# Patient Record
Sex: Male | Born: 1945 | Race: White | Hispanic: No | Marital: Married | Smoking: Former smoker
Health system: Southern US, Community
[De-identification: ages and names within clinical notes are randomized; demographics above are authoritative.]

## PROBLEM LIST (undated history)

## (undated) DIAGNOSIS — C4490 Unspecified malignant neoplasm of skin, unspecified: Secondary | ICD-10-CM

## (undated) DIAGNOSIS — I219 Acute myocardial infarction, unspecified: Secondary | ICD-10-CM

## (undated) DIAGNOSIS — Z9289 Personal history of other medical treatment: Secondary | ICD-10-CM

## (undated) DIAGNOSIS — J189 Pneumonia, unspecified organism: Secondary | ICD-10-CM

## (undated) DIAGNOSIS — G4733 Obstructive sleep apnea (adult) (pediatric): Secondary | ICD-10-CM

## (undated) DIAGNOSIS — Z87442 Personal history of urinary calculi: Secondary | ICD-10-CM

## (undated) DIAGNOSIS — I259 Chronic ischemic heart disease, unspecified: Secondary | ICD-10-CM

## (undated) DIAGNOSIS — I10 Essential (primary) hypertension: Secondary | ICD-10-CM

## (undated) DIAGNOSIS — M1990 Unspecified osteoarthritis, unspecified site: Secondary | ICD-10-CM

## (undated) DIAGNOSIS — N200 Calculus of kidney: Secondary | ICD-10-CM

## (undated) DIAGNOSIS — Z9989 Dependence on other enabling machines and devices: Secondary | ICD-10-CM

## (undated) DIAGNOSIS — Z8739 Personal history of other diseases of the musculoskeletal system and connective tissue: Secondary | ICD-10-CM

## (undated) DIAGNOSIS — I2510 Atherosclerotic heart disease of native coronary artery without angina pectoris: Secondary | ICD-10-CM

## (undated) DIAGNOSIS — R600 Localized edema: Secondary | ICD-10-CM

## (undated) DIAGNOSIS — D649 Anemia, unspecified: Secondary | ICD-10-CM

## (undated) DIAGNOSIS — Z8719 Personal history of other diseases of the digestive system: Secondary | ICD-10-CM

## (undated) DIAGNOSIS — K219 Gastro-esophageal reflux disease without esophagitis: Secondary | ICD-10-CM

## (undated) DIAGNOSIS — E785 Hyperlipidemia, unspecified: Secondary | ICD-10-CM

## (undated) HISTORY — PX: SKIN CANCER EXCISION: SHX779

## (undated) HISTORY — DX: Acute myocardial infarction, unspecified: I21.9

## (undated) HISTORY — DX: Localized edema: R60.0

## (undated) HISTORY — DX: Essential (primary) hypertension: I10

## (undated) HISTORY — PX: HERNIA REPAIR: SHX51

## (undated) HISTORY — DX: Hyperlipidemia, unspecified: E78.5

---
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
## 1976-04-21 HISTORY — PX: EYE SURGERY: SHX253

## 1978-04-21 DIAGNOSIS — Z8719 Personal history of other diseases of the digestive system: Secondary | ICD-10-CM

## 1978-04-21 HISTORY — DX: Personal history of other diseases of the digestive system: Z87.19

## 1995-04-22 HISTORY — PX: CATARACT EXTRACTION W/ INTRAOCULAR LENS IMPLANT: SHX1309

## 1998-03-29 ENCOUNTER — Ambulatory Visit (HOSPITAL_COMMUNITY): Admission: RE | Admit: 1998-03-29 | Discharge: 1998-03-29 | Payer: Self-pay | Admitting: Gastroenterology

## 1999-07-30 ENCOUNTER — Ambulatory Visit (HOSPITAL_COMMUNITY): Admission: RE | Admit: 1999-07-30 | Discharge: 1999-07-30 | Payer: Self-pay | Admitting: Gastroenterology

## 1999-07-30 ENCOUNTER — Encounter: Payer: Self-pay | Admitting: Gastroenterology

## 2001-04-07 ENCOUNTER — Ambulatory Visit (HOSPITAL_COMMUNITY): Admission: RE | Admit: 2001-04-07 | Discharge: 2001-04-07 | Payer: Self-pay | Admitting: Gastroenterology

## 2003-03-30 ENCOUNTER — Ambulatory Visit (HOSPITAL_COMMUNITY): Admission: RE | Admit: 2003-03-30 | Discharge: 2003-03-30 | Payer: Self-pay | Admitting: Gastroenterology

## 2004-04-04 ENCOUNTER — Encounter (INDEPENDENT_AMBULATORY_CARE_PROVIDER_SITE_OTHER): Payer: Self-pay | Admitting: *Deleted

## 2004-04-04 ENCOUNTER — Ambulatory Visit (HOSPITAL_COMMUNITY): Admission: RE | Admit: 2004-04-04 | Discharge: 2004-04-04 | Payer: Self-pay | Admitting: Gastroenterology

## 2005-04-21 HISTORY — PX: CORONARY ANGIOPLASTY WITH STENT PLACEMENT: SHX49

## 2005-10-29 ENCOUNTER — Inpatient Hospital Stay (HOSPITAL_COMMUNITY): Admission: EM | Admit: 2005-10-29 | Discharge: 2005-11-02 | Payer: Self-pay | Admitting: Emergency Medicine

## 2005-10-29 DIAGNOSIS — I219 Acute myocardial infarction, unspecified: Secondary | ICD-10-CM

## 2005-10-29 HISTORY — DX: Acute myocardial infarction, unspecified: I21.9

## 2005-10-31 ENCOUNTER — Encounter: Payer: Self-pay | Admitting: Cardiology

## 2005-11-13 ENCOUNTER — Encounter (HOSPITAL_COMMUNITY): Admission: RE | Admit: 2005-11-13 | Discharge: 2006-02-11 | Payer: Self-pay | Admitting: Cardiology

## 2006-02-12 ENCOUNTER — Encounter (HOSPITAL_COMMUNITY): Admission: RE | Admit: 2006-02-12 | Discharge: 2006-05-13 | Payer: Self-pay | Admitting: Cardiology

## 2007-01-06 HISTORY — PX: DOPPLER ECHOCARDIOGRAPHY: SHX263

## 2009-05-28 HISTORY — PX: CARDIOVASCULAR STRESS TEST: SHX262

## 2010-02-06 ENCOUNTER — Ambulatory Visit: Payer: Self-pay | Admitting: Cardiology

## 2010-05-13 ENCOUNTER — Ambulatory Visit: Payer: Self-pay | Admitting: Cardiology

## 2010-06-05 ENCOUNTER — Other Ambulatory Visit: Payer: Self-pay | Admitting: Chiropractic Medicine

## 2010-06-05 DIAGNOSIS — M25511 Pain in right shoulder: Secondary | ICD-10-CM

## 2010-06-10 ENCOUNTER — Ambulatory Visit
Admission: RE | Admit: 2010-06-10 | Discharge: 2010-06-10 | Disposition: A | Discharge status: Home / Self Care | Payer: Self-pay | Source: Ambulatory Visit | Attending: Chiropractic Medicine | Admitting: Chiropractic Medicine

## 2010-06-10 DIAGNOSIS — M25511 Pain in right shoulder: Secondary | ICD-10-CM

## 2010-06-20 HISTORY — PX: SHOULDER ARTHROSCOPY W/ ROTATOR CUFF REPAIR: SHX2400

## 2010-09-06 NOTE — Op Note (Signed)
NAME:  Douglas [name], Douglas [name]                         ACCOUNT NO.:  [id number]   MEDICAL RECORD NO.:  [id number]                   PATIENT TYPE:  AMB   LOCATION:  ENDO                                 FACILITY:  [facility]   PHYSICIAN:  James L. Malon Kindle., M.D.          DATE OF BIRTH:  14-Jun-1945   DATE OF PROCEDURE:  03/30/2003  DATE OF DISCHARGE:                                 OPERATIVE REPORT   PROCEDURE:  Esophagogastroduodenoscopy with Savary dilatation.   MEDICATIONS GIVEN:  Fentanyl 35 mcg, Versed 9 mg IV.   INDICATIONS FOR PROCEDURE:  Patient with a long history of esophageal  stricture, was dilated the last time in 2001 to 48 [place], comes in with  increasing dysphagia, had a meat impaction over Thanksgiving that he had to  regurgitate up.   DESCRIPTION OF PROCEDURE:  The procedure had been explained to the patient  including the potential risks and benefits of perforation and bleeding and  consent obtained.  The mobile fluoroscopy unit was used in the endosuite.  Initially, an endoscopy was performed.  The scope was passed and the distal  esophagus was reached.  There was a stricture just above a large hiatal  hernia estimated to be 4-5 cm in length.  The scope easily passed the  stricture and this stricture did appear fairly patent with no blockage at  all to passage of the scope.  The scope was passed into the stomach.  The  gastric outlet was identified and passed.  The duodenum including the bulb  and second portion were seen well.  The scope was withdrawn back into the  stomach.  The gastric antrum and body of the stomach were seen well and were  normal.  The fundus and cardia were seen on the retroflex view and was  normal.  Then, using fluoroscopic guidance, the Savary guide-wire was placed  through the scope along the greater curve of the stomach and the scope  withdrawn over the guide-wire.  Then, with the patient's neck extended, we  passed the 39, 42, 45, and 48 [place]  dilators.  There was a small amount of  heme in the mouth with the 39 [place] dilator even before it was passed, but  this was not present on any of the other dilations.  We had dilated to 45  J[place] on all previous occasions and I felt that this would be adequate.  We  did not dilate further.  There was no blood seen on the 48 [place] dilator.  After passage of the 48 French dilator, the dilator and guide-wire were  withdrawn as a single unit.  The patient tolerated the procedure well.  There were no immediate complications.   PLAN:  Routine post dilatation orders with plans to keep him on clear  liquids for 4-6 hours, slowly advance his diet, and call the report in one  week, we will dilate him again as needed.  He  will continue on his proton  pump inhibitor.                                               James L. Malon Kindle., M.D.    Waldron Session  D:  03/30/2003  T:  03/30/2003  Job:  [id number]   cc:   Windle Guard, M.D.  [address]  [place], [place] [address]  Fax: [phone]

## 2010-09-06 NOTE — Discharge Summary (Signed)
NAME:  Douglas [name], Douglas [name] NO.:  [id number]   MEDICAL RECORD NO.:  [id number]          PATIENT TYPE:  INP   LOCATION:  3715                         FACILITY:  [facility]   PHYSICIAN:  Cassell Clement, M.D. DATE OF BIRTH:  10-05-1945   DATE OF ADMISSION:  10/29/2005  DATE OF DISCHARGE:  11/02/2005                                 DISCHARGE SUMMARY   FINAL DIAGNOSES:  1. Coronary atherosclerosis with acute anterior wall myocardial      infarction.  2. Hypertensive cardiovascular disease.  3. Hypercholesterolemia.   OPERATIONS PERFORMED:  Emergency cardiac catheterization with PTCA of  occluded left anterior descending artery and insertion of drug-eluting  coronary artery stent by Dr. Kristeen Miss on 02/20/2006.   This 65 year old Caucasian gentleman was admitted on the evening of  10/29/2005 with interscapular pain and chest pressure.  He has had no nausea  but has had diaphoresis, pain began approximately 6:30 p.m. on the night of  admission while he was feeding his horses.  He has had no prior history of  known coronary atherosclerosis but had had a history of high blood pressure  for about 10 years and had been on atenolol and hydrochlorothiazide and  niacin.  His family history is positive for coronary atherosclerosis.  The  social history reveals that he is a nonsmoker.  Drinks occasional alcohol  works as an Location manager.   PAST SURGICAL HISTORY:  Includes eye surgery.   REVIEW OF SYSTEMS:  Reveals that he had a recent problem with fluctuating  hypertension and had seen Dr. Jeannetta Nap several weeks ago at which time his  electrocardiogram was normal.  The patient actually was to have seen Dr.  Patty Sermons in the office for an initial evaluation several days after the  date of his hospitalization.   PHYSICAL EXAMINATION:  VITAL SIGNS:  Blood pressure is 125/76, pulse 81.  He  has circumoral pallor.  Jugular venous pressure normal.  Carotids  normal.  CHEST:  Clear to percussion and auscultation.  HEART:  Reveals no murmur, gallop or rub.  ABDOMEN:  Is soft and nontender.  EXTREMITIES:  Show good peripheral pulses.   Electrocardiogram on admission in the emergency room showed hyperacute ST-  segment elevation and V1-V4. chest x-ray showed borderline cardiomegaly but  no acute change.   HOSPITAL COURSE:  The patient was evaluated in the emergency room. It was  felt that the patient was in the midst of an acute anteroseptal myocardial  infarction and a code STEMI was called.  Dr. Elease Hashimoto was consulted and took  the patient to the cath lab number emergently.  In the emergency room the  patient was given aspirin, IV nitroglycerin, IV morphine, IV heparin.  The  catheterization revealed a totally occluded left anterior descending artery  just distal to the first septal diagonal.  The circumflex was a large vessel  with an obtuse marginal 1.  The left main coronary artery was short.  The  right coronary artery was a large vessel with mild to moderate disease  proximal and mid distal.  The left ventriculogram showed an  ejection  fraction of approximately 40% with anterior hypokinesia.  The patient  underwent stenting with a Taxus drug-eluting stent with a good angiographic  results.  Post cath he did have some moderate hematoma at the cath site  requiring prolonged pressure.  The following day 10/30/2005 the patient was  doing well.  He was not complaining of any chest or back pain.  EKG post PCI  showed resolution of a hyperacute ST-segment elevation but did show Q-waves  in one aVL and V2-V5 consistent with a large anterior infarct.  Vital signs  were stable.  He was continued on aspirin, Plavix, Lopressor, Altace and  Lipitor.  Because of the hematoma he was kept on bedrest on 07/12.  By 7/13  he continued to have no further chest discomfort and had only mild right  groin pain.  Peak cardiac enzymes were elevated after  reperfusion showing a  CK of 4000, CK-MB of 350 and a troponin of greater than 100.  Renal function  remained normal.  Hemoglobin A1c was normal.  LDL cholesterol post acute MI  was 101 with an HDL of 31.  He was being treated empirically with Lipitor or  he underwent a two-dimensional echocardiogram on 11/02/2005 to be sure he  was not developing a left ventricular aneurysm or a left ventricular mural  thrombus which would require the addition of Coumadin.  The 2-D echo did not  show any left ventricular aneurysm or mural thrombus and therefore Coumadin  did not have to be added.  By 07/14 the patient was stable enough to be  moved to telemetry and to start cardiac rehab.  By 7/15, he was doing well  enough to be discharged home and he will be followed up in Dr. Yevonne Pax  office on 07/23.   DISCHARGE MEDICINES:  Enteric-coated aspirin 325 mg daily, Plavix 75 mg  daily, Altace 2.5 mg daily, Lipitor 80 mg daily, metoprolol 25 mg daily,  nitroglycerin 1/150 sublingually p.r.n. and Aciphex daily.  He is not to  drive or do any lifting for 1 week and he is to increase activity slowly.  He will be on a low-fat diet.  Condition on discharge is improved.           ______________________________  Cassell Clement, M.D.     TB/MEDQ  D:  12/08/2005  T:  12/08/2005  Job:  [id number]   cc:   Windle Guard, M.D.

## 2010-09-06 NOTE — Procedures (Signed)
[place]  Patient:    Douglas [name], Douglas [name] Visit Number: [id number] MRN: [id number]          Service Type: END Location: ENDO Attending Physician:  Orland Mustard Dictated by:   Llana Aliment. Randa Evens, M.D. Proc. Date: 04/07/01 Admit Date:  04/07/2001   CC:         Hadassah Pais. Jeannetta Nap, M.D.   Procedure Report  PROCEDURE PERFORMED:  Colonoscopy.  ENDOSCOPIST:  Llana Aliment. Randa Evens, M.D.  MEDICATIONS USED:  Fentanyl 75 mcg, Versed 6 mg IV  INSTRUMENT:  Pediatric Olympus video colonoscope.  INDICATIONS:  This is done as a follow-up to previous colon polyps.  DESCRIPTION OF PROCEDURE:  The procedure had been explained to the patient and consent obtained.  With the patient in the left lateral decubitus position, the Olympus pediatric video colonoscope was inserted and advanced under direct visualization.  The prep was quite good and the patient had extensive diverticular disease in the sigmoid colon and we were able to advance over the cecum.  The ileocecal valve and appendiceal orifice were identified. The scope was withdrawn.  The cecum, ascending colon, hepatic flexure, transverse colon, splenic flexure, descending and sigmoid colon were seen well upon withdrawal. No polyps were seen throughout the entire colon.  Extensive diverticular disease was seen in the sigmoid colon.  The scope was withdrawn, patient tolerated the procedure well.  Maintained on low flow oxygen and pulse oximeter throughout the procedure with no obvious problem.  ASSESSMENT: 1. No evidence of further polyps. 2. Extensive diverticular disease.  PLAN:  Recommend repeating in 2 years. Dictated by:   Llana Aliment. Randa Evens, M.D. Attending Physician:  Orland Mustard DD:  04/07/01 TD:  04/07/01 Job: [id number]

## 2010-09-06 NOTE — Cardiovascular Report (Signed)
NAME:  KRAVEN, CALK NO.:  [id number]   MEDICAL RECORD NO.:  [id number]          PATIENT TYPE:  INP   LOCATION:  2915                         FACILITY:  [facility]   PHYSICIAN:  Vesta Mixer, M.D. DATE OF BIRTH:  1945/08/01   DATE OF PROCEDURE:  10/29/2005  DATE OF DISCHARGE:                              CARDIAC CATHETERIZATION   PROCEDURE:  Left heart catheterization with coronary angiography and PTCA  and stenting of the left anterior descending artery.   Mr. Jain is a 65 year old gentleman with a recent onset of some chest  pains.  He presented to the emergency room with some chest pain that started  yesterday beginning around 6 o'clock.  He had a spiral CT which revealed no  evidence of dissection.  He was noted to have hyperacute T-waves on his EKG  and we were called for code STEMI.   The right femoral artery was cannulated using a modified Seldinger  technique.  There was some tortuosity of the vessel and there was a little  bit of difficulty getting up to the central circulation.  The vessel did not  have any atheroma.  All of the catheters were exchanged over a long  guidewire.   HEMODYNAMIC RESULTS:  LV pressure is 116/16 with an aortic pressure of  116/84.   ANGIOGRAPHY:  Left main:  The left main is fairly short and is unremarkable.   Left anterior descending artery gives off a very early large first diagonal  branch.  This vessel is fairly normal.  The LAD gives off a first septal  branch and then is abruptly occluded.  The left circumflex artery gives off  a first obtuse marginal artery which is unremarkable.  There are a few minor  luminal irregularities.   The right coronary artery is very large.  There is mild to moderate  irregularities.  The RCA is dominant.  There is a 60% stenosis just prior to  the origin of the posterior descending artery.  The posterior descending  artery and the posterolateral segment artery have minor luminal  irregularities.   The left ventriculogram was performed in the 30 RAO position.  It reveals  severe hypokinesis/akinesis of the anterior and apical walls.  The ejection  fraction is around 40%.  He does have mild to moderate left ventricular  dysfunction.   PCI:  The left main was engaged using the Judkins left 3.5 side hole guide.  An Clinical cytogeneticist guide wire was placed down the LAD.  Originally, we were  placing the wire blindly since there was no flow down the LAD.  We  originally placed it out the first diagonal vessel.  We positioned a 3.0 x  20 mm Quantum Maverick across the area of occlusion and then withdrew it.  Using this daughtering effect, we were able to get some re-establishment of  flow and then at that point reguided the guide wire down into the main LAD.   The 3.0 x 28 mm Quantum monorail was positioned across this obstruction.  It  was inflated up to 6 atmospheres for 10 seconds, 10  atmospheres for 35  seconds.  We then used a 2.75 x 28 mm Taxus stent.  It was deployed just  distal to the first septal branch at 12 atmospheres for 25 seconds.  This  resulted in a very nice angiographic result.  Poststent dilatation was  achieved using a 3.0 x 20 mm Taxus stent.  It was postdilated up to 12  atmospheres for 30 seconds in the distal aspect and then 12 atmospheres for  15 seconds in the proximal aspect.  There is a very nice angiographic  result.  There was some compromise remaining of the second diagonal vessel.  There was sluggish flow down this vessel.  We made several attempts to  position the wire down this vessel but were not able to cross with a wire.  The lumen actually appears to be getting a little bit better as we took more  pictures.   COMPLICATIONS:  None.   CONCLUSION:  1.  Acute anterior wall myocardial infarction.  2.  Successful percutaneous transluminal coronary angioplasty and stenting      of the mid-left anterior descending artery.  The patient  will be started      on low dose Altace and metoprolol for prevention of left ventricular      dilatation.   The patient does have small to moderate-sized hematoma as leaving the case.  We held pressure for approximately 15 minutes with improvement of the  hematoma.  We will instruct the nurses to pull the sheath as soon as  possible.           ______________________________  Vesta Mixer, M.D.     PJN/MEDQ  D:  10/30/2005  T:  10/30/2005  Job:  [id number]   cc:   Windle Guard, M.D.  Fax: [phone]

## 2010-09-06 NOTE — H&P (Signed)
NAME:  Douglas [name], TIETJE NO.:  [id number]   MEDICAL RECORD NO.:  [id number]          PATIENT TYPE:  INP   LOCATION:  1829                         FACILITY:  [facility]   PHYSICIAN:  Cassell Clement, M.D. DATE OF BIRTH:  1946/03/28   DATE OF ADMISSION:  10/29/2005  DATE OF DISCHARGE:                                HISTORY & PHYSICAL   CHIEF COMPLAINT:  Chest pain.   This is a 65 year old Caucasian gentleman admitted with interscapular pain  and chest pressure.  He has had no nausea.  He has had positive diaphoresis.  The pain began about 6:30 p.m. tonight while feeding his horses.  He has had  no prior cardiac history but has had hypertension for about 10 years.  He  has been on atenolol and hydrochlorothiazide and niacin 7000 mg twice a day.   FAMILY HISTORY:  Positive for coronary artery disease in both parents.   SOCIAL HISTORY:  Reveals that he is married and he has two children.  He is  a nonsmoker.  He drinks occasional alcohol.  He works as an Air traffic controller.   PAST SURGICAL HISTORY:  Eye surgery.   REVIEW OF SYSTEMS:  Reveals that he has had a recent problem with  fluctuating hypertension and saw Dr. Jeannetta Nap several weeks ago which on EKG  was normal and the patient actually has an appointment to see Dr. Patty Sermons  in the next several days.   REVIEW OF SYSTEMS:  GI:  Negative.  GU:  Negative except for history of  kidney stones.  RESPIRATORY:  Negative.   PHYSICAL EXAMINATION:  Blood pressure 125/76, pulse 81 and regular,  respirations are normal.  This is a slightly pale with __________.  Jugular  venous pressure normal.  Carotids normal.  Chest is clear to percussion and  auscultation.  The heart reveals no murmur, rub, or gallop.  The abdomen is  soft and nontender.  Extremities show good peripheral pulses.   His electrocardiogram shows normal sinus rhythm with hyperacute ST segment  elevation in V1 through V4.  Chest x-ray shows  borderline cardiomegaly but  no acute change.   IMPRESSION:  1.  Chest pain and electrocardiogram changes consistent with acute      anteroseptal myocardial infarction.  Pain is ongoing despite having      received IV Dilaudid and IV morphine.  The patient's pain is presently      at a level of 3/10.  2.  Hypertensive cardiovascular disease.  3.  History of hypercholesterolemia.   DISPOSITION:  Code STEMI has been called.  I have called in Dr. Elease Hashimoto who  will be taking the patient to the catheter lab.  The patient has been given  aspirin, IV nitroglycerin, IV morphine and IV heparin.   Condition is guarded at this point.           ______________________________  Cassell Clement, M.D.     TB/MEDQ  D:  10/29/2005  T:  10/29/2005  Job:  [id number]   cc:   Windle Guard, M.D.  Fax: [phone]   Vesta Mixer,  M.D.  Fax: [phone]

## 2010-09-06 NOTE — Op Note (Signed)
NAME:  Douglas [name], SEYMORE NO.:  [id number]   MEDICAL RECORD NO.:  [id number]          PATIENT TYPE:  AMB   LOCATION:  ENDO                         FACILITY:  [facility]   PHYSICIAN:  James L. Malon Kindle., M.D.DATE OF BIRTH:  09-26-1945   DATE OF PROCEDURE:  04/04/2004  DATE OF DISCHARGE:                                 OPERATIVE REPORT   PROCEDURE:  Colonoscopy and coagulation of polyp.   MEDICATIONS:  1.  Fentanyl 75 mcg.  2.  Versed 6 mg IV.   SCOPE:  Olympus pediatric adjustable colonoscope.   INDICATION:  Previous history of polyps.  Procedure done as a follow up.   DESCRIPTION OF PROCEDURE:  The procedure had been explained to the patient  and consent obtained.  Left lateral decubitus position, the Olympus  pediatric adjustable scope inserted and advanced in the colon.  The prep was  excellent.  We were able to easily reach the cecum without difficulty.  The  ileocecal valve and appendiceal orifice were seen.  The scope was withdrawn.  The colon was examined on withdrawal.  The cecum, ascending colon,  transverse colon, splenic flexure, descending colon were seen well.  The  sigmoid colon revealed extensive diverticulosis.  No polyps were seen.  The  scope withdrawn to the rectum.  There was a 3 mm sessile polyp that was  cauterized by hot biopsy forceps.  No other polyps were seen in the rectum.  The scope was withdrawn.  The patient tolerated the procedure well.   ASSESSMENT:  1.  A small rectal polyp cauterized.  211.3.  2.  Extensive diverticulosis.  62.10.   PLAN:  1.  Routine postpolypectomy instructions.  2.  We will recommend repeating procedure in 5 years.      JLE/MEDQ  D:  04/04/2004  T:  04/04/2004  Job:  [id number]   cc:   Windle Guard, M.D.  [address]  [place], [place] [address]  Fax: [phone]

## 2010-09-06 NOTE — Procedures (Signed)
[place]  Patient:    Douglas [name], Douglas [name]                      MRN: [id number] Proc. Date: 07/30/99 Adm. Date:  [id number] Attending:  Orland Mustard CC:         Hadassah Pais. Jeannetta Nap, M.D.                           Procedure Report  PROCEDURE:  Esophagogastroduodenoscopy with esophageal dilation.  ENDOSCOPIST:  Llana Aliment. Edwards, M.D.  MEDICATIONS:  Fentanyl 75 mcg and Versed 6 mg IV.  INDICATIONS:  Previous history of esophageal strictures.  Has been dilated and passed to 48-French.  Last time in 1997.  He has not required dilation since then.  DESCRIPTION OF PROCEDURE:  The procedure had been explained to the patient including potential risks and benefits, and consent obtained.  With the patient in the fluoroscopy unit on the endoscopy unit using a portable fluoroscopy, the Olympus video endoscope was inserted blindly into the esophagus and advanced. There was a stricture seen above a 3-4 cm hiatal hernia.  This was easily passed with the scope.  The stomach was entered, the pylorus identified and passed. The duodenum including the bulb and second portion were seen well.  A complete gastroscopy was performed.  Forward and retroflexed view of the stomach was normal. Using fluoroscopic guidance, the scope was then withdrawn over the Savary guidewire.  The patient then had his neck extended and we passed in the usual manner a 39, 42, 45, and 48-French dilator.  There was no blood with any of the  dilators.  He tolerated this well.  Since his last dilation had been the 48-French, we went ahead and stopped at that point.  There were no immediate complications.  ASSESSMENT:  Esophageal stricture dilated to 48-French.  PLAN:  Will keep on liquids for 4-6 hours, call for chest pain, fever, trouble breathing, etc.  He will call back as needed for repeat dilation. DD:  07/30/99 TD:  07/31/99 Job: [id number]

## 2010-09-27 ENCOUNTER — Encounter: Payer: Self-pay | Admitting: Cardiology

## 2010-10-01 ENCOUNTER — Encounter: Payer: Self-pay | Admitting: Cardiology

## 2010-10-01 ENCOUNTER — Ambulatory Visit (INDEPENDENT_AMBULATORY_CARE_PROVIDER_SITE_OTHER): Payer: Medicare Other | Admitting: Cardiology

## 2010-10-01 DIAGNOSIS — D509 Iron deficiency anemia, unspecified: Secondary | ICD-10-CM | POA: Insufficient documentation

## 2010-10-01 DIAGNOSIS — D500 Iron deficiency anemia secondary to blood loss (chronic): Secondary | ICD-10-CM

## 2010-10-01 DIAGNOSIS — E785 Hyperlipidemia, unspecified: Secondary | ICD-10-CM

## 2010-10-01 DIAGNOSIS — I259 Chronic ischemic heart disease, unspecified: Secondary | ICD-10-CM | POA: Insufficient documentation

## 2010-10-01 DIAGNOSIS — I119 Hypertensive heart disease without heart failure: Secondary | ICD-10-CM | POA: Insufficient documentation

## 2010-10-01 DIAGNOSIS — Z79899 Other long term (current) drug therapy: Secondary | ICD-10-CM

## 2010-10-01 NOTE — Assessment & Plan Note (Signed)
The patient is on simvastatin 20 mg daily.  He brought in some recent labs from the [place] which show good control of his LDL cholesterol.  He's not having any side effects from the statin therapy.

## 2010-10-01 NOTE — Assessment & Plan Note (Signed)
The patient has a past history of severe iron deficiency anemia and is followed by Dr. Carman Ching.  The patient denies any recent evidence of GI blood loss.He is on baby aspirin and Plavix daily because of his drug-eluting stent to his LAD.

## 2010-10-01 NOTE — Assessment & Plan Note (Signed)
This pleasant gentleman has a history of a prior acute anteroseptal myocardial infarction on 10/29/05 treated with a Taxus drug-eluting stent to his LAD.  His last nuclear stress test was 05/28/09 and showed an ejection fraction of 57%.  There is a moderate area of infarction in the apical anterior and distal anteroseptal wall and there was regional wall motion abnormality but there was no reversible ischemia.  The patient denies any recurrent chest pain.  His energy level is good.  He underwent successful shoulder surgery by Dr. Cleophas Dunker on 07/04/10.  Because of his sore shoulder surgery he's been less physically active and his weight has gone up 2 pounds

## 2010-10-01 NOTE — Progress Notes (Signed)
Douglas [name] Date of Birth:  10/20/1945 [facility] / [facility] [address]   [address] [place], [place]  [address] [phone]           Fax   [phone]  HPI: This pleasant 65 year old gentleman is seen for a scheduled followup office visit.  He has a past history of ischemic heart disease and had a large anterior wall myocardial infarction 5 years ago on 10/29/05.  He has not been experiencing any exertional chest pain.  He's not having symptoms of congestive heart failure.  His last nuclear stress test was 05/28/09 and showed normal ejection fraction of 57% and he did have a scar of the apical anterior and distal anteroseptal walls but no reversible ischemia.  The patient has a past history of severe GI bleeding of a chronic nature with severe iron deficiency anemia.  He is followed by Dr. Randa Evens.  He presently is tolerating his baby aspirin and his Plavix without evidence of GI bleed.  Current Outpatient Prescriptions  Medication Sig Dispense Refill  . amLODipine (NORVASC) 5 MG tablet Take 5 mg by mouth 2 (two) times daily.        Marland Kitchen aspirin 81 MG EC tablet Take 81 mg by mouth daily.        . carvedilol (COREG) 25 MG tablet Take 25 mg by mouth 2 (two) times daily with a meal.        . Cholecalciferol (VITAMIN D) 2000 UNITS tablet Take 2,000 Units by mouth daily.        . clopidogrel (PLAVIX) 75 MG tablet Take 75 mg by mouth daily.        . colchicine 0.6 MG tablet Take 0.6 mg by mouth as needed.        . ferrous sulfate 325 (65 FE) MG EC tablet Take 325 mg by mouth 2 (two) times a week.        . fexofenadine (ALLEGRA) 180 MG tablet Take 180 mg by mouth as needed.        . nitroGLYCERIN (NITROSTAT) 0.4 MG SL tablet Place 0.4 mg under the tongue every 5 (five) minutes as needed.        . simvastatin (ZOCOR) 20 MG tablet Take 20 mg by mouth at bedtime.        . valACYclovir (VALTREX) 1000 MG tablet Take 1,000 mg by mouth as needed.          No Known  Allergies  Patient Active Problem List  Diagnoses  . Ischemic heart disease  . Benign hypertensive heart disease without heart failure  . Dyslipidemia  . Iron deficiency anemia secondary to blood loss (chronic)    History  Smoking status  . Former Smoker  . Quit date: 04/21/1968  Smokeless tobacco  . Not on file    History  Alcohol Use  . Yes    occasional    Family History  Problem Relation Age of Onset  . Heart disease Mother   . Heart disease Father     Review of Systems: The patient denies any heat or cold intolerance.  No weight gain or weight loss.  The patient denies headaches or blurry vision.  There is no cough or sputum production.  The patient denies dizziness.  There is no hematuria or hematochezia.  The patient denies any muscle aches or arthritis.  The patient denies any rash.  The patient denies frequent falling or instability.  There is no history of depression or anxiety.  All other systems were reviewed and are negative.   Physical Exam: Filed Vitals:   10/01/10 1004  BP: 120/82  Pulse: 60  The general appearance reveals a well-developed well-nourished gentleman in no distress.  The head and neck exam reveals pupils equal and reactive.  Extraocular movements are full.  There is no scleral icterus.  The mouth and pharynx are normal.  The neck is supple.  The carotids reveal no bruits.  The jugular venous pressure is normal.  The  thyroid is not enlarged.  There is no lymphadenopathy.  The chest is clear to percussion and auscultation.  There are no rales or rhonchi.  Expansion of the chest is symmetrical.  The precordium is quiet.  The first heart sound is normal.  The second heart sound is physiologically split.  There is no murmur gallop rub or click.  There is no abnormal lift or heave.  The abdomen is soft and nontender.  The bowel sounds are normal.  The liver and spleen are not enlarged.  There are no abdominal masses.  There are no abdominal bruits.   Extremities reveal good pedal pulses.  There is no phlebitis or edema.  There is no cyanosis or clubbing.  Strength is normal and symmetrical in all extremities.  There is no lateralizing weakness.  There are no sensory deficits.  The skin is warm and dry.  There is no rash.    Assessment / Plan: Continue careful heart healthy weight loss diet.  Recheck in 4 months for a followup office visit fasting lipid panel hepatic function panel and basal metabolic panel.

## 2010-10-08 ENCOUNTER — Other Ambulatory Visit: Payer: Self-pay | Admitting: Dermatology

## 2010-10-14 ENCOUNTER — Telehealth: Payer: Self-pay | Admitting: Cardiology

## 2010-10-14 DIAGNOSIS — R609 Edema, unspecified: Secondary | ICD-10-CM

## 2010-10-14 MED ORDER — FUROSEMIDE 20 MG PO TABS: 20.0000 mg | ORAL_TABLET | Freq: Every day | ORAL | Status: DC | PRN

## 2010-10-14 NOTE — Telephone Encounter (Signed)
Wt up about 9 lbs yesterday and wife stated he looked "puffy".  Wt down some today and feels ok. Swelling in ankles and feet down some.  No other symptoms other than the wt and swelling.  Takes norvasc bid.  Please advise

## 2010-10-14 NOTE — Telephone Encounter (Signed)
Spoke with Dr Patty Sermons and will decrease amlodipine to 5 mg to daily (was bid) and add lasix 20 mg as needed.  Called and advised wife.  Also advised her to have him continue to take blood pressure and let us know if it goes up.  Verbalized understanding.

## 2010-10-14 NOTE — Telephone Encounter (Signed)
Patient's wife is concerned that he may be retaining fluid.  His ankles were very swollen yesterday.

## 2010-12-16 ENCOUNTER — Other Ambulatory Visit: Payer: Self-pay | Admitting: *Deleted

## 2010-12-16 DIAGNOSIS — I519 Heart disease, unspecified: Secondary | ICD-10-CM

## 2010-12-16 MED ORDER — CLOPIDOGREL BISULFATE 75 MG PO TABS: 75.0000 mg | ORAL_TABLET | Freq: Every day | ORAL | Status: DC

## 2010-12-16 NOTE — Telephone Encounter (Signed)
Refilled generic plavix 

## 2011-01-14 ENCOUNTER — Telehealth: Payer: Self-pay | Admitting: Cardiology

## 2011-01-14 ENCOUNTER — Other Ambulatory Visit: Payer: Self-pay | Admitting: *Deleted

## 2011-01-14 DIAGNOSIS — R5383 Other fatigue: Secondary | ICD-10-CM

## 2011-01-14 NOTE — Telephone Encounter (Signed)
One increase amlodipine back to 5 mg twice a day and if swelling recurs he can try taking the Lasix more often.

## 2011-01-14 NOTE — Telephone Encounter (Signed)
Since he has had a past history of anemia he should have a CBC if he's not had one recently

## 2011-01-14 NOTE — Telephone Encounter (Signed)
When spoke with wife she said he was feeling more tired than normal, but usually when blood pressure going up some.  Also it is later in the day.  Denies any other symptoms.  Advised if increasing medication did not help, call back.  Any thing else?

## 2011-01-14 NOTE — Telephone Encounter (Signed)
Advised ok to increase

## 2011-01-14 NOTE — Telephone Encounter (Signed)
Advised and scheduled

## 2011-01-14 NOTE — Telephone Encounter (Signed)
Pt calling to speak w/ Juliette Alcide. Please return call.   Cell: [phone]

## 2011-01-14 NOTE — Telephone Encounter (Signed)
Was taking Amlodipine 5 mg twice daily and was decreased to once daily secondary to swelling.  Had swelling twice where he had to use lasix.  blood pressure 139/87 and has been going up.  Was taking Amlodipine 5 mg twice daily, and doesn't know if he should increase back.

## 2011-01-15 ENCOUNTER — Other Ambulatory Visit (INDEPENDENT_AMBULATORY_CARE_PROVIDER_SITE_OTHER): Payer: Medicare Other | Admitting: *Deleted

## 2011-01-15 DIAGNOSIS — R5383 Other fatigue: Secondary | ICD-10-CM

## 2011-01-15 DIAGNOSIS — R5381 Other malaise: Secondary | ICD-10-CM

## 2011-01-15 LAB — CBC WITH DIFFERENTIAL/PLATELET
Basophils Absolute: 0.1 10*3/uL (ref 0.0–0.1)
Basophils Relative: 0.9 % (ref 0.0–3.0)
Eosinophils Absolute: 0.3 10*3/uL (ref 0.0–0.7)
Eosinophils Relative: 5.3 % — ABNORMAL HIGH (ref 0.0–5.0)
HCT: 44.8 % (ref 39.0–52.0)
Hemoglobin: 14.8 g/dL (ref 13.0–17.0)
Lymphocytes Relative: 22.6 % (ref 12.0–46.0)
Lymphs Abs: 1.2 10*3/uL (ref 0.7–4.0)
MCHC: 33 g/dL (ref 30.0–36.0)
MCV: 90.2 fl (ref 78.0–100.0)
Monocytes Absolute: 0.8 10*3/uL (ref 0.1–1.0)
Monocytes Relative: 15.4 % — ABNORMAL HIGH (ref 3.0–12.0)
Neutro Abs: 3 10*3/uL (ref 1.4–7.7)
Neutrophils Relative %: 55.8 % (ref 43.0–77.0)
Platelets: 172 10*3/uL (ref 150.0–400.0)
RBC: 4.96 Mil/uL (ref 4.22–5.81)
RDW: 15.5 % — ABNORMAL HIGH (ref 11.5–14.6)
WBC: 5.5 10*3/uL (ref 4.5–10.5)

## 2011-01-16 ENCOUNTER — Telehealth: Payer: Self-pay | Admitting: *Deleted

## 2011-01-16 NOTE — Telephone Encounter (Signed)
Advised of labs 

## 2011-01-16 NOTE — Telephone Encounter (Signed)
Message copied by Eugenia Pancoast on Thu Jan 16, 2011  9:07 AM ------      Message from: Cassell Clement      Created: Wed Jan 15, 2011  8:20 PM       Please report.  The CBCis satifactoory.  Not anemic. HGB 14.8.  Let us know if the fatigue persists.

## 2011-03-17 ENCOUNTER — Ambulatory Visit (INDEPENDENT_AMBULATORY_CARE_PROVIDER_SITE_OTHER): Payer: Medicare Other | Admitting: Cardiology

## 2011-03-17 ENCOUNTER — Other Ambulatory Visit: Payer: Medicare Other | Admitting: *Deleted

## 2011-03-17 ENCOUNTER — Encounter: Payer: Self-pay | Admitting: Cardiology

## 2011-03-17 VITALS — BP 126/88 | HR 60 | Ht 67.0 in | Wt 200.0 lb

## 2011-03-17 DIAGNOSIS — E7800 Pure hypercholesterolemia, unspecified: Secondary | ICD-10-CM

## 2011-03-17 DIAGNOSIS — Z79899 Other long term (current) drug therapy: Secondary | ICD-10-CM

## 2011-03-17 DIAGNOSIS — E785 Hyperlipidemia, unspecified: Secondary | ICD-10-CM

## 2011-03-17 DIAGNOSIS — D500 Iron deficiency anemia secondary to blood loss (chronic): Secondary | ICD-10-CM

## 2011-03-17 DIAGNOSIS — I259 Chronic ischemic heart disease, unspecified: Secondary | ICD-10-CM

## 2011-03-17 DIAGNOSIS — D649 Anemia, unspecified: Secondary | ICD-10-CM

## 2011-03-17 LAB — BASIC METABOLIC PANEL
BUN: 16 mg/dL (ref 6–23)
CO2: 28 mEq/L (ref 19–32)
Calcium: 9.1 mg/dL (ref 8.4–10.5)
Chloride: 103 mEq/L (ref 96–112)
Creatinine, Ser: 1 mg/dL (ref 0.4–1.5)
GFR: 78.56 mL/min (ref >60.00)
Glucose, Bld: 98 mg/dL (ref 70–99)
Potassium: 5 mEq/L (ref 3.5–5.1)
Sodium: 139 mEq/L (ref 135–145)

## 2011-03-17 LAB — LIPID PANEL
Cholesterol: 135 mg/dL (ref 0–200)
HDL: 34.7 mg/dL — ABNORMAL LOW (ref >39.00)
LDL Cholesterol: 71 mg/dL (ref 0–99)
Total CHOL/HDL Ratio: 4
Triglycerides: 145 mg/dL (ref 0.0–149.0)
VLDL: 29 mg/dL (ref 0.0–40.0)

## 2011-03-17 LAB — CBC WITH DIFFERENTIAL/PLATELET
Basophils Absolute: 0 10*3/uL (ref 0.0–0.1)
Basophils Relative: 0.7 % (ref 0.0–3.0)
Eosinophils Absolute: 0.3 10*3/uL (ref 0.0–0.7)
Eosinophils Relative: 4.9 % (ref 0.0–5.0)
HCT: 46.1 % (ref 39.0–52.0)
Hemoglobin: 15.6 g/dL (ref 13.0–17.0)
Lymphocytes Relative: 16.9 % (ref 12.0–46.0)
Lymphs Abs: 1.1 10*3/uL (ref 0.7–4.0)
MCHC: 33.7 g/dL (ref 30.0–36.0)
MCV: 90.3 fl (ref 78.0–100.0)
Monocytes Absolute: 0.7 10*3/uL (ref 0.1–1.0)
Monocytes Relative: 11 % (ref 3.0–12.0)
Neutro Abs: 4.1 10*3/uL (ref 1.4–7.7)
Neutrophils Relative %: 66.5 % (ref 43.0–77.0)
Platelets: 166 10*3/uL (ref 150.0–400.0)
RBC: 5.11 Mil/uL (ref 4.22–5.81)
RDW: 15.4 % — ABNORMAL HIGH (ref 11.5–14.6)
WBC: 6.2 10*3/uL (ref 4.5–10.5)

## 2011-03-17 LAB — HEPATIC FUNCTION PANEL
ALT: 19 U/L (ref 0–53)
AST: 20 U/L (ref 0–37)
Albumin: 3.8 g/dL (ref 3.5–5.2)
Alkaline Phosphatase: 61 U/L (ref 39–117)
Bilirubin, Direct: 0.1 mg/dL (ref 0.0–0.3)
Total Bilirubin: 0.9 mg/dL (ref 0.3–1.2)
Total Protein: 6.4 g/dL (ref 6.0–8.3)

## 2011-03-17 NOTE — Assessment & Plan Note (Signed)
The patient has not been aware of any hematochezia or unusual melena.  He is on oral iron which causes his stools to be dark.  He has not had any unusual fatigue which he usually notes if he becomes severely anemic

## 2011-03-17 NOTE — Assessment & Plan Note (Signed)
Patient has gained 3 pounds since last visit.  He is trying to watch his diet in terms of cholesterol.  He is not having any side effects from the statin therapy.

## 2011-03-17 NOTE — Patient Instructions (Signed)
Will obtain labs today and call you with the results Your physician recommends that you continue on your current medications as directed. Please refer to the Current Medication list given to you today. Your physician recommends that you schedule a follow-up appointment in: 4 months with fasting labs (LP/BMET/HFP/CBC/EKG)

## 2011-03-17 NOTE — Assessment & Plan Note (Signed)
The patient denies any recurrent chest pain or increased shortness of breath.  No palpitations dizziness or syncope.

## 2011-03-17 NOTE — Progress Notes (Signed)
Douglas [name] Date of Birth:  [birth year] [facility] / [facility] [address]   [address] [place], [place]  [address] [phone]           Fax   [phone]  History of Present Illness: This pleasant 65 year old gentleman is seen for a scheduled four-month followup office visit.  Heart disease.  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  He had a nuclear stress test in February 2011 which showed normal ejection fraction of 57% and no reversible ischemia and he does have a scar involving the apex anterior and distal anteroseptal walls.  Patient has a past history of severe GI bleeding with severe iron deficiency anemia and is followed by Dr. Randa Evens.  He is presently tolerating his antiplatelet therapy with no evidence of recurrent GI bleed.  Current Outpatient Prescriptions  Medication Sig Dispense Refill  . amLODipine (NORVASC) 5 MG tablet Take 5 mg by mouth 2 (two) times daily.       Marland Kitchen aspirin 81 MG EC tablet Take 81 mg by mouth daily.        . carvedilol (COREG) 25 MG tablet Take 25 mg by mouth 2 (two) times daily with a meal.        . Cholecalciferol (VITAMIN D) 2000 UNITS tablet Take 2,000 Units by mouth daily.        . clopidogrel (PLAVIX) 75 MG tablet Take 1 tablet (75 mg total) by mouth daily.  30 tablet  11  . colchicine 0.6 MG tablet Take 0.6 mg by mouth as needed.        . ferrous sulfate 325 (65 FE) MG EC tablet Take 325 mg by mouth 2 (two) times a week.        . fexofenadine (ALLEGRA) 180 MG tablet Take 180 mg by mouth as needed.        . furosemide (LASIX) 20 MG tablet Take 1 tablet (20 mg total) by mouth daily as needed.  30 tablet  5  . nitroGLYCERIN (NITROSTAT) 0.4 MG SL tablet Place 0.4 mg under the tongue every 5 (five) minutes as needed.        . simvastatin (ZOCOR) 20 MG tablet Take 20 mg by mouth at bedtime.        . valACYclovir (VALTREX) 1000 MG tablet Take 1,000 mg by mouth as needed.          No Known  Allergies  Patient Active Problem List  Diagnoses  . Ischemic heart disease  . Benign hypertensive heart disease without heart failure  . Dyslipidemia  . Iron deficiency anemia secondary to blood loss (chronic)    History  Smoking status  . Former Smoker  . Quit date: 04/21/1968  Smokeless tobacco  . Not on file    History  Alcohol Use  . Yes    occasional    Family History  Problem Relation Age of Onset  . Heart disease Mother   . Heart disease Father     Review of Systems: Constitutional: no fever chills diaphoresis or fatigue or change in weight.  Head and neck: no hearing loss, no epistaxis, no photophobia or visual disturbance. Respiratory: No cough, shortness of breath or wheezing. Cardiovascular: No chest pain peripheral edema, palpitations. Gastrointestinal: No abdominal distention, no abdominal pain, no change in bowel habits hematochezia or melena. Genitourinary: No dysuria, no frequency, no urgency, no nocturia. Musculoskeletal:No arthralgias, no back pain, no gait disturbance or myalgias. Neurological: No dizziness, no headaches,  no numbness, no seizures, no syncope, no weakness, no tremors. Hematologic: No lymphadenopathy, no easy bruising. Psychiatric: No confusion, no hallucinations, no sleep disturbance.    Physical Exam: Filed Vitals:   03/17/11 0955  BP: 126/88  Pulse: 60  The head and neck exam reveals pupils equal and reactive.  Extraocular movements are full.  There is no scleral icterus.  The mouth and pharynx are normal.  The neck is supple.  The carotids reveal no bruits.  The jugular venous pressure is normal.  The  thyroid is not enlarged.  There is no lymphadenopathy.  The chest is clear to percussion and auscultation.  There are no rales or rhonchi.  Expansion of the chest is symmetrical.  The precordium is quiet.  The first heart sound is normal.  The second heart sound is physiologically split.  There is no murmur gallop rub or click.   There is no abnormal lift or heave.  The abdomen is soft and nontender.  The bowel sounds are normal.  The liver and spleen are not enlarged.  There are no abdominal masses.  There are no abdominal bruits.  Extremities reveal good pedal pulses.  There is no phlebitis or edema.  There is no cyanosis or clubbing.  Strength is normal and symmetrical in all extremities.  There is no lateralizing weakness.  There are no sensory deficits.  The skin is warm and dry.  There is no rash.     Assessment / Plan continue same medication.  We are checking lab work today.  Recheck in 4 months for followup lab work office visit and EKG.

## 2011-04-30 ENCOUNTER — Other Ambulatory Visit: Payer: Self-pay | Admitting: *Deleted

## 2011-04-30 MED ORDER — AMLODIPINE BESYLATE 5 MG PO TABS: 5.0000 mg | ORAL_TABLET | Freq: Two times a day (BID) | ORAL | Status: DC

## 2011-04-30 NOTE — Telephone Encounter (Signed)
Refilled amlodipine 

## 2011-07-08 ENCOUNTER — Ambulatory Visit (INDEPENDENT_AMBULATORY_CARE_PROVIDER_SITE_OTHER): Payer: Medicare Other | Admitting: *Deleted

## 2011-07-08 DIAGNOSIS — E785 Hyperlipidemia, unspecified: Secondary | ICD-10-CM

## 2011-07-08 DIAGNOSIS — I259 Chronic ischemic heart disease, unspecified: Secondary | ICD-10-CM

## 2011-07-08 DIAGNOSIS — I119 Hypertensive heart disease without heart failure: Secondary | ICD-10-CM

## 2011-07-08 LAB — HEPATIC FUNCTION PANEL
ALT: 16 U/L (ref 0–53)
AST: 15 U/L (ref 0–37)
Albumin: 3.6 g/dL (ref 3.5–5.2)
Alkaline Phosphatase: 51 U/L (ref 39–117)
Bilirubin, Direct: 0.1 mg/dL (ref 0.0–0.3)
Total Bilirubin: 0.6 mg/dL (ref 0.3–1.2)
Total Protein: 6.3 g/dL (ref 6.0–8.3)

## 2011-07-08 LAB — BASIC METABOLIC PANEL
BUN: 23 mg/dL (ref 6–23)
CO2: 25 mEq/L (ref 19–32)
Calcium: 8.7 mg/dL (ref 8.4–10.5)
Chloride: 106 mEq/L (ref 96–112)
Creatinine, Ser: 0.9 mg/dL (ref 0.4–1.5)
GFR: 92.01 mL/min (ref >60.00)
Glucose, Bld: 102 mg/dL — ABNORMAL HIGH (ref 70–99)
Potassium: 4.1 mEq/L (ref 3.5–5.1)
Sodium: 137 mEq/L (ref 135–145)

## 2011-07-08 LAB — LIPID PANEL
Cholesterol: 120 mg/dL (ref 0–200)
HDL: 36.5 mg/dL — ABNORMAL LOW (ref >39.00)
LDL Cholesterol: 62 mg/dL (ref 0–99)
Total CHOL/HDL Ratio: 3
Triglycerides: 106 mg/dL (ref 0.0–149.0)
VLDL: 21.2 mg/dL (ref 0.0–40.0)

## 2011-07-08 NOTE — Progress Notes (Signed)
Quick Note:  Please make copy of labs for patient visit. ______ 

## 2011-07-15 ENCOUNTER — Encounter: Payer: Self-pay | Admitting: Cardiology

## 2011-07-15 ENCOUNTER — Ambulatory Visit (INDEPENDENT_AMBULATORY_CARE_PROVIDER_SITE_OTHER): Payer: Medicare Other | Admitting: Cardiology

## 2011-07-15 VITALS — BP 130/90 | HR 54 | Ht 67.0 in | Wt 198.0 lb

## 2011-07-15 DIAGNOSIS — D500 Iron deficiency anemia secondary to blood loss (chronic): Secondary | ICD-10-CM

## 2011-07-15 DIAGNOSIS — D649 Anemia, unspecified: Secondary | ICD-10-CM

## 2011-07-15 DIAGNOSIS — I2510 Atherosclerotic heart disease of native coronary artery without angina pectoris: Secondary | ICD-10-CM

## 2011-07-15 DIAGNOSIS — I119 Hypertensive heart disease without heart failure: Secondary | ICD-10-CM

## 2011-07-15 DIAGNOSIS — I259 Chronic ischemic heart disease, unspecified: Secondary | ICD-10-CM

## 2011-07-15 DIAGNOSIS — E7800 Pure hypercholesterolemia, unspecified: Secondary | ICD-10-CM

## 2011-07-15 DIAGNOSIS — E785 Hyperlipidemia, unspecified: Secondary | ICD-10-CM

## 2011-07-15 MED ORDER — LOSARTAN POTASSIUM 25 MG PO TABS: 25.0000 mg | ORAL_TABLET | Freq: Every day | ORAL | Status: DC

## 2011-07-15 NOTE — Patient Instructions (Signed)
Add Losartan 25 mg daily  Work harder on weight loss  Your physician wants you to follow-up in: 4 months You will receive a reminder letter in the mail two months in advance. If you don't receive a letter, please call our office to schedule the follow-up appointment.

## 2011-07-15 NOTE — Progress Notes (Signed)
Douglas [name] Date of Birth:  February 14, 1946 [facility] [address] [place], [place]  [address] [phone]         Fax   [phone]  History of Present Illness: This pleasant 66 year old gentleman is seen for a four-month followup office visit.  He has a past history of ischemic heart disease.  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  Nuclear stress test in February 2011 showed a normal ejection fraction of 57% and no reversible ischemia.  He does have a scar involving the apex anterior and distal anteroseptal walls.  Patient has a past history of severe chronic GI bleeding with severe iron deficiency anemia.  He has not been aware of any recent recurrence of GI bleeding.  He has a history of high blood pressure.  Diastolic pressures at home are generally normal but sometimes elevated.  Here in the office today his diastolic pressure is elevated.  Current Outpatient Prescriptions  Medication Sig Dispense Refill  . amLODipine (NORVASC) 5 MG tablet Take 1 tablet (5 mg total) by mouth 2 (two) times daily.  180 tablet  3  . aspirin 81 MG EC tablet Take 81 mg by mouth daily.        . carvedilol (COREG) 25 MG tablet Take 25 mg by mouth 2 (two) times daily with a meal.        . Cholecalciferol (VITAMIN D) 2000 UNITS tablet Take 2,000 Units by mouth daily.        . clopidogrel (PLAVIX) 75 MG tablet Take 1 tablet (75 mg total) by mouth daily.  30 tablet  11  . colchicine 0.6 MG tablet Take 0.6 mg by mouth as needed.        . ferrous sulfate 325 (65 FE) MG EC tablet Take 325 mg by mouth 2 (two) times a week.        . fexofenadine (ALLEGRA) 180 MG tablet Take 180 mg by mouth as needed.        . furosemide (LASIX) 20 MG tablet Take 1 tablet (20 mg total) by mouth daily as needed.  30 tablet  5  . nitroGLYCERIN (NITROSTAT) 0.4 MG SL tablet Place 0.4 mg under the tongue every 5 (five) minutes as needed.        . simvastatin (ZOCOR) 20 MG tablet Take 20 mg  by mouth at bedtime.        . valACYclovir (VALTREX) 1000 MG tablet Take 1,000 mg by mouth as needed.        Marland Kitchen losartan (COZAAR) 25 MG tablet Take 1 tablet (25 mg total) by mouth daily.  30 tablet  5    No Known Allergies  Patient Active Problem List  Diagnoses  . Ischemic heart disease  . Benign hypertensive heart disease without heart failure  . Dyslipidemia  . Iron deficiency anemia secondary to blood loss (chronic)    History  Smoking status  . Former Smoker  . Quit date: 04/21/1968  Smokeless tobacco  . Not on file    History  Alcohol Use  . Yes    occasional    Family History  Problem Relation Age of Onset  . Heart disease Mother   . Heart disease Father     Review of Systems: Constitutional: no fever chills diaphoresis or fatigue or change in weight.  Head and neck: no hearing loss, no epistaxis, no photophobia or visual disturbance. Respiratory: No cough, shortness of breath or wheezing. Cardiovascular: No  chest pain peripheral edema, palpitations. Gastrointestinal: No abdominal distention, no abdominal pain, no change in bowel habits hematochezia or melena. Genitourinary: No dysuria, no frequency, no urgency, no nocturia. Musculoskeletal:No arthralgias, no back pain, no gait disturbance or myalgias. Neurological: No dizziness, no headaches, no numbness, no seizures, no syncope, no weakness, no tremors. Hematologic: No lymphadenopathy, no easy bruising. Psychiatric: No confusion, no hallucinations, no sleep disturbance.    Physical Exam: Filed Vitals:   07/15/11 0912  BP: 130/90  Pulse: 54   the general appearance reveals a well-developed well-nourished gentleman in no distress.The head and neck exam reveals pupils equal and reactive.  Extraocular movements are full.  There is no scleral icterus.  The mouth and pharynx are normal.  The neck is supple.  The carotids reveal no bruits.  The jugular venous pressure is normal.  The  thyroid is not enlarged.   There is no lymphadenopathy.  The chest is clear to percussion and auscultation.  There are no rales or rhonchi.  Expansion of the chest is symmetrical.  The precordium is quiet.  The first heart sound is normal.  The second heart sound is physiologically split.  There is no murmur gallop rub or click.  There is no abnormal lift or heave.  The abdomen is soft and nontender.  The bowel sounds are normal.  The liver and spleen are not enlarged.  There are no abdominal masses.  There are no abdominal bruits.  Extremities reveal good pedal pulses.  There is no phlebitis.  He does have some edema of his right lower leg and ankle related to recent inflamed bursitis of the right knee. There is no cyanosis or clubbing.  Strength is normal and symmetrical in all extremities.  There is no lateralizing weakness.  There are no sensory deficits.  The skin is warm and dry.  There is no rash.  EKG today shows sinus bradycardia and an old anteroseptal myocardial infarction but no ischemic changes   Assessment / Plan: Continue same medication.  And losartan 25 mg daily.  Recheck in 4 months for followup office visit and lab work including CBC

## 2011-07-15 NOTE — Assessment & Plan Note (Signed)
The patient has a past history of dyslipidemia.  His lipids have been good on current therapy.  He is not having any myalgias.

## 2011-07-15 NOTE — Assessment & Plan Note (Signed)
His diastolic blood pressure today is elevated and we are going to add losartan 25 mg one daily.  Previously he had been on a larger dose and it caused him to be dizzy.  He is intolerant of ACE inhibitors because of cough.

## 2011-07-15 NOTE — Assessment & Plan Note (Signed)
The patient has not been experiencing any exertional chest pain or angina pectoris. 

## 2011-07-15 NOTE — Assessment & Plan Note (Signed)
Patient remains on daily iron because of his history of slow chronic GI blood loss.

## 2011-09-26 ENCOUNTER — Other Ambulatory Visit: Payer: Self-pay | Admitting: Dermatology

## 2011-11-28 ENCOUNTER — Other Ambulatory Visit (INDEPENDENT_AMBULATORY_CARE_PROVIDER_SITE_OTHER): Payer: Medicare Other

## 2011-11-28 DIAGNOSIS — E7800 Pure hypercholesterolemia, unspecified: Secondary | ICD-10-CM

## 2011-11-28 DIAGNOSIS — I119 Hypertensive heart disease without heart failure: Secondary | ICD-10-CM

## 2011-11-28 DIAGNOSIS — D649 Anemia, unspecified: Secondary | ICD-10-CM

## 2011-11-28 LAB — CBC WITH DIFFERENTIAL/PLATELET
Basophils Absolute: 0 10*3/uL (ref 0.0–0.1)
Basophils Relative: 0.8 % (ref 0.0–3.0)
Eosinophils Absolute: 0.3 10*3/uL (ref 0.0–0.7)
Eosinophils Relative: 6 % — ABNORMAL HIGH (ref 0.0–5.0)
HCT: 46.3 % (ref 39.0–52.0)
Hemoglobin: 15.2 g/dL (ref 13.0–17.0)
Lymphocytes Relative: 16.4 % (ref 12.0–46.0)
Lymphs Abs: 0.8 10*3/uL (ref 0.7–4.0)
MCHC: 32.8 g/dL (ref 30.0–36.0)
MCV: 92.7 fl (ref 78.0–100.0)
Monocytes Absolute: 0.6 10*3/uL (ref 0.1–1.0)
Monocytes Relative: 12.1 % — ABNORMAL HIGH (ref 3.0–12.0)
Neutro Abs: 3.1 10*3/uL (ref 1.4–7.7)
Neutrophils Relative %: 64.7 % (ref 43.0–77.0)
Platelets: 162 10*3/uL (ref 150.0–400.0)
RBC: 4.99 Mil/uL (ref 4.22–5.81)
RDW: 14.1 % (ref 11.5–14.6)
WBC: 4.8 10*3/uL (ref 4.5–10.5)

## 2011-11-28 LAB — LIPID PANEL
Cholesterol: 125 mg/dL (ref 0–200)
HDL: 35.2 mg/dL — ABNORMAL LOW (ref >39.00)
LDL Cholesterol: 65 mg/dL (ref 0–99)
Total CHOL/HDL Ratio: 4
Triglycerides: 126 mg/dL (ref 0.0–149.0)
VLDL: 25.2 mg/dL (ref 0.0–40.0)

## 2011-11-28 LAB — BASIC METABOLIC PANEL
BUN: 16 mg/dL (ref 6–23)
CO2: 27 mEq/L (ref 19–32)
Calcium: 8.8 mg/dL (ref 8.4–10.5)
Chloride: 102 mEq/L (ref 96–112)
Creatinine, Ser: 0.9 mg/dL (ref 0.4–1.5)
GFR: 91.9 mL/min (ref >60.00)
Glucose, Bld: 94 mg/dL (ref 70–99)
Potassium: 4 mEq/L (ref 3.5–5.1)
Sodium: 135 mEq/L (ref 135–145)

## 2011-11-28 LAB — HEPATIC FUNCTION PANEL
ALT: 15 U/L (ref 0–53)
AST: 17 U/L (ref 0–37)
Albumin: 3.7 g/dL (ref 3.5–5.2)
Alkaline Phosphatase: 57 U/L (ref 39–117)
Bilirubin, Direct: 0.1 mg/dL (ref 0.0–0.3)
Total Bilirubin: 0.8 mg/dL (ref 0.3–1.2)
Total Protein: 6.5 g/dL (ref 6.0–8.3)

## 2011-11-28 NOTE — Progress Notes (Signed)
Quick Note:  Please make copy of labs for patient visit. ______ 

## 2011-12-04 ENCOUNTER — Encounter: Payer: Self-pay | Admitting: Cardiology

## 2011-12-04 ENCOUNTER — Ambulatory Visit (INDEPENDENT_AMBULATORY_CARE_PROVIDER_SITE_OTHER): Payer: Medicare Other | Admitting: Cardiology

## 2011-12-04 VITALS — BP 118/88 | HR 60 | Ht 67.0 in | Wt 199.0 lb

## 2011-12-04 DIAGNOSIS — I259 Chronic ischemic heart disease, unspecified: Secondary | ICD-10-CM

## 2011-12-04 DIAGNOSIS — D500 Iron deficiency anemia secondary to blood loss (chronic): Secondary | ICD-10-CM

## 2011-12-04 DIAGNOSIS — I119 Hypertensive heart disease without heart failure: Secondary | ICD-10-CM

## 2011-12-04 DIAGNOSIS — E7800 Pure hypercholesterolemia, unspecified: Secondary | ICD-10-CM

## 2011-12-04 DIAGNOSIS — I2510 Atherosclerotic heart disease of native coronary artery without angina pectoris: Secondary | ICD-10-CM

## 2011-12-04 MED ORDER — AMLODIPINE BESYLATE 10 MG PO TABS: 10.0000 mg | ORAL_TABLET | Freq: Every day | ORAL | Status: DC

## 2011-12-04 NOTE — Assessment & Plan Note (Signed)
We checked a recent CBC which shows that he is maintaining his hemoglobin in the range of 15 and he takes oral iron just twice a week

## 2011-12-04 NOTE — Assessment & Plan Note (Signed)
Again here in the office his diastolic pressure is mildly elevated.  He has been taking his amlodipine 5 mg twice a day and we are going to change it to 10 mg once a day in the morning to try to get better blood pressure control

## 2011-12-04 NOTE — Progress Notes (Signed)
Douglas [name] Date of Birth:  1946/04/20 [facility] [address] [place], [place]  [address] [phone]         Fax   [phone]  History of Present Illness: This pleasant 66 year old gentleman is seen for a four-month followup office visit.  He has a history of ischemic heart disease.  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  He has not had bypass surgery.  A nuclear stress test in February 2011 showed no reversible ischemia.  He does have a large scar involving the apex and the anterior distal and anteroseptal walls.  His ejection fraction was 57%.  Patient also has a past history of chronic GI bleeding managed with low-dose intermittent oral iron therapy he has a history of high blood pressure with elevated diastolics here in the office but blood pressure often normal at home.  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  Current Outpatient Prescriptions  Medication Sig Dispense Refill  . amLODipine (NORVASC) 10 MG tablet Take 1 tablet (10 mg total) by mouth daily.  30 tablet  5  . aspirin 81 MG EC tablet Take 81 mg by mouth daily.        . carvedilol (COREG) 25 MG tablet Take 25 mg by mouth 2 (two) times daily with a meal.        . Cholecalciferol (VITAMIN D) 2000 UNITS tablet Take 2,000 Units by mouth daily.        . clopidogrel (PLAVIX) 75 MG tablet Take 1 tablet (75 mg total) by mouth daily.  30 tablet  11  . colchicine 0.6 MG tablet Take 0.6 mg by mouth as needed.        . ferrous sulfate 325 (65 FE) MG EC tablet Take 325 mg by mouth 2 (two) times a week.        . fexofenadine (ALLEGRA) 180 MG tablet Take 180 mg by mouth as needed.        Marland Kitchen losartan (COZAAR) 25 MG tablet Take 1 tablet (25 mg total) by mouth daily.  30 tablet  5  . nitroGLYCERIN (NITROSTAT) 0.4 MG SL tablet Place 0.4 mg under the tongue every 5 (five)  minutes as needed.        . simvastatin (ZOCOR) 20 MG tablet Take 20 mg by mouth at bedtime.        . valACYclovir (VALTREX) 1000 MG tablet Take 1,000 mg by mouth as needed.        Marland Kitchen DISCONTD: amLODipine (NORVASC) 5 MG tablet Take 1 tablet (5 mg total) by mouth 2 (two) times daily.  180 tablet  3  . furosemide (LASIX) 20 MG tablet Take 1 tablet (20 mg total) by mouth daily as needed.  30 tablet  5    Allergies  Allergen Reactions  . Ace Inhibitors     Cough  . Lisinopril     cough    Patient Active Problem List  Diagnosis  . Ischemic heart disease  . Benign hypertensive heart disease without heart failure  . Dyslipidemia  . Iron deficiency anemia secondary to blood loss (chronic)    History  Smoking status  . Former Smoker  . Quit date: 04/21/1968  Smokeless tobacco  . Not on file    History  Alcohol Use  . Yes  occasional    Family History  Problem Relation Age of Onset  . Heart disease Mother   . Heart disease Father     Review of Systems: Constitutional: no fever chills diaphoresis or fatigue or change in weight.  Head and neck: no hearing loss, no epistaxis, no photophobia or visual disturbance. Respiratory: No cough, shortness of breath or wheezing. Cardiovascular: No chest pain peripheral edema, palpitations. Gastrointestinal: No abdominal distention, no abdominal pain, no change in bowel habits hematochezia or melena. Genitourinary: No dysuria, no frequency, no urgency, no nocturia. Musculoskeletal:No arthralgias, no back pain, no gait disturbance or myalgias. Neurological: No dizziness, no headaches, no numbness, no seizures, no syncope, no weakness, no tremors. Hematologic: No lymphadenopathy, no easy bruising. Psychiatric: No confusion, no hallucinations, no sleep disturbance.    Physical Exam: Filed Vitals:   12/04/11 0908  BP: 118/88  Pulse: 60   the general appearance reveals a well-developed well-nourished gentleman in no distress.The head  and neck exam reveals pupils equal and reactive.  Extraocular movements are full.  There is no scleral icterus.  The mouth and pharynx are normal.  The neck is supple.  The carotids reveal no bruits.  The jugular venous pressure is normal.  The  thyroid is not enlarged.  There is no lymphadenopathy.  The chest is clear to percussion and auscultation.  There are no rales or rhonchi.  Expansion of the chest is symmetrical.  The precordium is quiet.  The first heart sound is normal.  The second heart sound is physiologically split.  There is no murmur gallop rub or click.  There is no abnormal lift or heave.  The abdomen is soft and nontender.  The bowel sounds are normal.  The liver and spleen are not enlarged.  There are no abdominal masses.  There are no abdominal bruits.  Extremities reveal good pedal pulses.  There is no phlebitis or edema.  There is no cyanosis or clubbing.  Strength is normal and symmetrical in all extremities.  There is no lateralizing weakness.  There are no sensory deficits.  The skin is warm and dry.  There is no rash.     Assessment / Plan: We reviewed his labs which are excellent.  Continue on same medication.  Recheck in 4 months for followup office visit and lab work and EKG

## 2011-12-04 NOTE — Patient Instructions (Addendum)
Your physician recommends that you schedule a follow-up appointment in: 4 months with labs (lipid,liver,bmet,cbc) and ekg that day  Your physician has recommended you make the following change in your medication: Change your Amlodipine to 10mg  once daily  Your physician recommends that you return for lab work in: at your next appt in 4 months (lipid, liver, bmet, cbc, ekg)

## 2011-12-04 NOTE — Assessment & Plan Note (Signed)
The patient has had no recurrent angina pectoris or chest pain 

## 2011-12-15 ENCOUNTER — Other Ambulatory Visit: Payer: Self-pay | Admitting: *Deleted

## 2011-12-15 DIAGNOSIS — I519 Heart disease, unspecified: Secondary | ICD-10-CM

## 2011-12-15 MED ORDER — CLOPIDOGREL BISULFATE 75 MG PO TABS: 75.0000 mg | ORAL_TABLET | Freq: Every day | ORAL | Status: DC

## 2011-12-15 NOTE — Telephone Encounter (Signed)
Refilled clopidogrel.

## 2012-01-05 ENCOUNTER — Other Ambulatory Visit: Payer: Self-pay | Admitting: *Deleted

## 2012-01-05 DIAGNOSIS — I119 Hypertensive heart disease without heart failure: Secondary | ICD-10-CM

## 2012-01-05 MED ORDER — LOSARTAN POTASSIUM 25 MG PO TABS: 25.0000 mg | ORAL_TABLET | Freq: Every day | ORAL | Status: DC

## 2012-01-05 NOTE — Telephone Encounter (Signed)
Refilled losartan 

## 2012-03-29 ENCOUNTER — Other Ambulatory Visit (INDEPENDENT_AMBULATORY_CARE_PROVIDER_SITE_OTHER): Payer: Medicare Other

## 2012-03-29 DIAGNOSIS — E7800 Pure hypercholesterolemia, unspecified: Secondary | ICD-10-CM

## 2012-03-29 DIAGNOSIS — I2510 Atherosclerotic heart disease of native coronary artery without angina pectoris: Secondary | ICD-10-CM

## 2012-03-29 LAB — BASIC METABOLIC PANEL
BUN: 15 mg/dL (ref 6–23)
CO2: 28 mEq/L (ref 19–32)
Calcium: 8.7 mg/dL (ref 8.4–10.5)
Chloride: 103 mEq/L (ref 96–112)
Creatinine, Ser: 1.1 mg/dL (ref 0.4–1.5)
GFR: 71.72 mL/min (ref >60.00)
Glucose, Bld: 105 mg/dL — ABNORMAL HIGH (ref 70–99)
Potassium: 4.3 mEq/L (ref 3.5–5.1)
Sodium: 136 mEq/L (ref 135–145)

## 2012-03-29 LAB — CBC WITH DIFFERENTIAL/PLATELET
Basophils Absolute: 0 10*3/uL (ref 0.0–0.1)
Basophils Relative: 0.5 % (ref 0.0–3.0)
Eosinophils Absolute: 0.3 10*3/uL (ref 0.0–0.7)
Eosinophils Relative: 4.8 % (ref 0.0–5.0)
HCT: 46.1 % (ref 39.0–52.0)
Hemoglobin: 15 g/dL (ref 13.0–17.0)
Lymphocytes Relative: 13.6 % (ref 12.0–46.0)
Lymphs Abs: 0.8 10*3/uL (ref 0.7–4.0)
MCHC: 32.5 g/dL (ref 30.0–36.0)
MCV: 92.3 fl (ref 78.0–100.0)
Monocytes Absolute: 0.7 10*3/uL (ref 0.1–1.0)
Monocytes Relative: 12.8 % — ABNORMAL HIGH (ref 3.0–12.0)
Neutro Abs: 4 10*3/uL (ref 1.4–7.7)
Neutrophils Relative %: 68.3 % (ref 43.0–77.0)
Platelets: 175 10*3/uL (ref 150.0–400.0)
RBC: 5 Mil/uL (ref 4.22–5.81)
RDW: 14.3 % (ref 11.5–14.6)
WBC: 5.9 10*3/uL (ref 4.5–10.5)

## 2012-03-29 LAB — LIPID PANEL
Cholesterol: 130 mg/dL (ref 0–200)
HDL: 30.5 mg/dL — ABNORMAL LOW (ref >39.00)
LDL Cholesterol: 71 mg/dL (ref 0–99)
Total CHOL/HDL Ratio: 4
Triglycerides: 142 mg/dL (ref 0.0–149.0)
VLDL: 28.4 mg/dL (ref 0.0–40.0)

## 2012-03-29 LAB — HEPATIC FUNCTION PANEL
ALT: 17 U/L (ref 0–53)
AST: 16 U/L (ref 0–37)
Albumin: 3.6 g/dL (ref 3.5–5.2)
Alkaline Phosphatase: 51 U/L (ref 39–117)
Bilirubin, Direct: 0.1 mg/dL (ref 0.0–0.3)
Total Bilirubin: 0.7 mg/dL (ref 0.3–1.2)
Total Protein: 6.3 g/dL (ref 6.0–8.3)

## 2012-03-29 NOTE — Progress Notes (Signed)
Quick Note:  Please make copy of labs for patient visit. ______ 

## 2012-04-07 ENCOUNTER — Ambulatory Visit (INDEPENDENT_AMBULATORY_CARE_PROVIDER_SITE_OTHER): Payer: Medicare Other | Admitting: Cardiology

## 2012-04-07 ENCOUNTER — Encounter: Payer: Self-pay | Admitting: Cardiology

## 2012-04-07 VITALS — BP 128/70 | HR 50 | Resp 18 | Ht 67.0 in | Wt 202.4 lb

## 2012-04-07 DIAGNOSIS — R059 Cough, unspecified: Secondary | ICD-10-CM

## 2012-04-07 DIAGNOSIS — R05 Cough: Secondary | ICD-10-CM

## 2012-04-07 DIAGNOSIS — E785 Hyperlipidemia, unspecified: Secondary | ICD-10-CM

## 2012-04-07 DIAGNOSIS — I259 Chronic ischemic heart disease, unspecified: Secondary | ICD-10-CM

## 2012-04-07 DIAGNOSIS — D500 Iron deficiency anemia secondary to blood loss (chronic): Secondary | ICD-10-CM

## 2012-04-07 DIAGNOSIS — I119 Hypertensive heart disease without heart failure: Secondary | ICD-10-CM

## 2012-04-07 NOTE — Progress Notes (Signed)
Douglas [name] Date of Birth:  [birth year] [facility] [address] [place], [place]  [address] [phone]         Fax   [phone]  History of Present Illness: This pleasant 66 year old gentleman is seen for a four-month followup office visit. He has a history of ischemic heart disease. 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 He has not had bypass surgery. A nuclear stress test in February 2011 showed no reversible ischemia. He does have a large scar involving the apex and the anterior distal and anteroseptal walls. His ejection fraction was 57%. Patient also has a past history of chronic GI bleeding managed with low-dose intermittent oral iron therapy.  He reminds me that the source of the GI bleeding was never actually pinpointed. he has a history of high blood pressure with elevated diastolics here in the office but blood pressure often normal at home. 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   Current Outpatient Prescriptions  Medication Sig Dispense Refill  . amLODipine (NORVASC) 10 MG tablet Take 1 tablet (10 mg total) by mouth daily.  30 tablet  5  . aspirin 81 MG EC tablet Take 81 mg by mouth daily.        . carvedilol (COREG) 25 MG tablet Take 25 mg by mouth 2 (two) times daily with a meal.        . Cholecalciferol (VITAMIN D) 2000 UNITS tablet Take 2,000 Units by mouth daily.        . clopidogrel (PLAVIX) 75 MG tablet Take 1 tablet (75 mg total) by mouth daily.  30 tablet  11  . colchicine 0.6 MG tablet Take 0.6 mg by mouth as needed.        . fexofenadine (ALLEGRA) 180 MG tablet Take 180 mg by mouth as needed.        . nitroGLYCERIN (NITROSTAT) 0.4 MG SL tablet Place 0.4 mg under the tongue every 5 (five) minutes as needed.        . simvastatin (ZOCOR) 20 MG tablet Take 20 mg by mouth at bedtime.        .  valACYclovir (VALTREX) 1000 MG tablet Take 1,000 mg by mouth as needed.        . furosemide (LASIX) 20 MG tablet Take 20 mg by mouth daily as needed.        Allergies  Allergen Reactions  . Ace Inhibitors     Cough  . Lisinopril     cough  . Losartan     Cough    Patient Active Problem List  Diagnosis  . Ischemic heart disease  . Benign hypertensive heart disease without heart failure  . Dyslipidemia  . Iron deficiency anemia secondary to blood loss (chronic)    History  Smoking status  . Former Smoker  . Quit date: 04/21/1968  Smokeless tobacco  . Not on file    History  Alcohol Use  . Yes    Comment: occasional    Family History  Problem Relation Age of Onset  . Heart disease Mother   . Heart disease Father     Review of Systems: Constitutional: no fever chills diaphoresis or fatigue or change in weight.  Head and neck: no hearing loss, no epistaxis, no photophobia or visual disturbance. Respiratory: No cough, shortness  of breath or wheezing. Cardiovascular: No chest pain peripheral edema, palpitations. Gastrointestinal: No abdominal distention, no abdominal pain, no change in bowel habits hematochezia or melena. Genitourinary: No dysuria, no frequency, no urgency, no nocturia. Musculoskeletal:No arthralgias, no back pain, no gait disturbance or myalgias. Neurological: No dizziness, no headaches, no numbness, no seizures, no syncope, no weakness, no tremors. Hematologic: No lymphadenopathy, no easy bruising. Psychiatric: No confusion, no hallucinations, no sleep disturbance.    Physical Exam: Filed Vitals:   04/07/12 0846  BP: 128/70  Pulse: 50  Resp: 18   the general appearance reveals a pleasant gentleman in no distress.The head and neck exam reveals pupils equal and reactive.  Extraocular movements are full.  There is no scleral icterus.  The mouth and pharynx are normal.  The neck is supple.  The carotids reveal no bruits.  The jugular venous  pressure is normal.  The  thyroid is not enlarged.  There is no lymphadenopathy.  The chest is clear to percussion and auscultation.  There are no rales or rhonchi.  Expansion of the chest is symmetrical.  The precordium is quiet.  The first heart sound is normal.  The second heart sound is physiologically split.  There is no murmur gallop rub or click.  There is no abnormal lift or heave.  The abdomen is soft and nontender.  The bowel sounds are normal.  The liver and spleen are not enlarged.  There are no abdominal masses.  There are no abdominal bruits.  Extremities reveal good pedal pulses.  There is no phlebitis or edema.  There is no cyanosis or clubbing.  Strength is normal and symmetrical in all extremities.  There is no lateralizing weakness.  There are no sensory deficits.  The skin is warm and dry.  There is no rash.  EKG shows sinus bradycardia and an old anteroseptal myocardial infarction.   Assessment / Plan: And TIMI same medication.  Stop iron.  Also we're stopping losartan because of a cough.  He had previously developed a cough 2 lisinopril.  Recheck in 4 months for office visit and lab work including CBC and fasting lipids hepatic function panel and basal metabolic panel

## 2012-04-07 NOTE — Assessment & Plan Note (Signed)
The patient has not been experiencing any angina pectoris.  No need for nitroglycerin.

## 2012-04-07 NOTE — Patient Instructions (Addendum)
STOP LOSARTAN AND IRON  Increase walking and work on weight loss  Your physician wants you to follow-up in: 4 months with fasting labs (lp/bmet/hfp/cbc)  You will receive a reminder letter in the mail two months in advance. If you don't receive a letter, please call our office to schedule the follow-up appointment.

## 2012-04-07 NOTE — Assessment & Plan Note (Signed)
His hemoglobin is staying up.  He has been taking iron just twice a week.  He has not been aware of any obvious hematochezia or melena.  We will try stopping the iron and rechecking a CBC at his next visit

## 2012-04-07 NOTE — Assessment & Plan Note (Signed)
Patient is on simvastatin 20 mg daily for cholesterol.  Is up 3 pounds since last visit.  He has been less physically active.  Continue same medication and work harder on weight loss and exercise

## 2012-06-05 ENCOUNTER — Other Ambulatory Visit: Payer: Self-pay

## 2012-07-27 ENCOUNTER — Other Ambulatory Visit: Payer: Self-pay | Admitting: *Deleted

## 2012-07-27 DIAGNOSIS — I119 Hypertensive heart disease without heart failure: Secondary | ICD-10-CM

## 2012-07-27 MED ORDER — AMLODIPINE BESYLATE 10 MG PO TABS: 10.0000 mg | ORAL_TABLET | Freq: Every day | ORAL | Status: DC

## 2012-07-29 ENCOUNTER — Other Ambulatory Visit (INDEPENDENT_AMBULATORY_CARE_PROVIDER_SITE_OTHER): Payer: Medicare Other

## 2012-07-29 DIAGNOSIS — D500 Iron deficiency anemia secondary to blood loss (chronic): Secondary | ICD-10-CM

## 2012-07-29 DIAGNOSIS — I119 Hypertensive heart disease without heart failure: Secondary | ICD-10-CM

## 2012-07-29 LAB — CBC WITH DIFFERENTIAL/PLATELET
Basophils Absolute: 0.1 10*3/uL (ref 0.0–0.1)
Basophils Relative: 1.1 % (ref 0.0–3.0)
Eosinophils Absolute: 0.2 10*3/uL (ref 0.0–0.7)
Eosinophils Relative: 5 % (ref 0.0–5.0)
HCT: 46.7 % (ref 39.0–52.0)
Hemoglobin: 15.7 g/dL (ref 13.0–17.0)
Lymphocytes Relative: 16.5 % (ref 12.0–46.0)
Lymphs Abs: 0.8 10*3/uL (ref 0.7–4.0)
MCHC: 33.7 g/dL (ref 30.0–36.0)
MCV: 90.2 fl (ref 78.0–100.0)
Monocytes Absolute: 0.6 10*3/uL (ref 0.1–1.0)
Monocytes Relative: 13 % — ABNORMAL HIGH (ref 3.0–12.0)
Neutro Abs: 3.2 10*3/uL (ref 1.4–7.7)
Neutrophils Relative %: 64.4 % (ref 43.0–77.0)
Platelets: 167 10*3/uL (ref 150.0–400.0)
RBC: 5.18 Mil/uL (ref 4.22–5.81)
RDW: 14.4 % (ref 11.5–14.6)
WBC: 4.9 10*3/uL (ref 4.5–10.5)

## 2012-07-29 LAB — HEPATIC FUNCTION PANEL
ALT: 18 U/L (ref 0–53)
AST: 17 U/L (ref 0–37)
Albumin: 3.7 g/dL (ref 3.5–5.2)
Alkaline Phosphatase: 58 U/L (ref 39–117)
Bilirubin, Direct: 0.1 mg/dL (ref 0.0–0.3)
Total Bilirubin: 0.6 mg/dL (ref 0.3–1.2)
Total Protein: 6.5 g/dL (ref 6.0–8.3)

## 2012-07-29 LAB — BASIC METABOLIC PANEL
BUN: 18 mg/dL (ref 6–23)
CO2: 28 mEq/L (ref 19–32)
Calcium: 8.9 mg/dL (ref 8.4–10.5)
Chloride: 102 mEq/L (ref 96–112)
Creatinine, Ser: 0.9 mg/dL (ref 0.4–1.5)
GFR: 85 mL/min (ref >60.00)
Glucose, Bld: 94 mg/dL (ref 70–99)
Potassium: 4.4 mEq/L (ref 3.5–5.1)
Sodium: 137 mEq/L (ref 135–145)

## 2012-07-29 LAB — LIPID PANEL
Cholesterol: 128 mg/dL (ref 0–200)
HDL: 29.4 mg/dL — ABNORMAL LOW (ref >39.00)
LDL Cholesterol: 75 mg/dL (ref 0–99)
Total CHOL/HDL Ratio: 4
Triglycerides: 118 mg/dL (ref 0.0–149.0)
VLDL: 23.6 mg/dL (ref 0.0–40.0)

## 2012-07-29 NOTE — Progress Notes (Signed)
Quick Note:  Please make copy of labs for patient visit. ______ 

## 2012-08-02 ENCOUNTER — Ambulatory Visit (INDEPENDENT_AMBULATORY_CARE_PROVIDER_SITE_OTHER): Payer: Medicare Other | Admitting: Cardiology

## 2012-08-02 ENCOUNTER — Encounter: Payer: Self-pay | Admitting: Cardiology

## 2012-08-02 VITALS — BP 118/76 | HR 53 | Ht 67.0 in | Wt 202.2 lb

## 2012-08-02 DIAGNOSIS — E7800 Pure hypercholesterolemia, unspecified: Secondary | ICD-10-CM

## 2012-08-02 DIAGNOSIS — I259 Chronic ischemic heart disease, unspecified: Secondary | ICD-10-CM

## 2012-08-02 DIAGNOSIS — I119 Hypertensive heart disease without heart failure: Secondary | ICD-10-CM

## 2012-08-02 DIAGNOSIS — D500 Iron deficiency anemia secondary to blood loss (chronic): Secondary | ICD-10-CM

## 2012-08-02 NOTE — Assessment & Plan Note (Signed)
The patient has not been experiencing any recurrent chest pain or angina. 

## 2012-08-02 NOTE — Progress Notes (Signed)
Douglas [name] Date of Birth:  30-Jan-1946 [facility] [address] [place], [place]  [address] [phone]         Fax   [phone]  History of Present Illness: This pleasant 66 year old gentleman is seen for a four-month followup office visit. He has a history of ischemic heart disease. 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 He has not had bypass surgery. A nuclear stress test in February 2011 showed no reversible ischemia. He does have a large scar involving the apex and the anterior distal and anteroseptal walls. His ejection fraction was 57%. Patient also has a past history of chronic GI bleeding managed with low-dose intermittent oral iron therapy.  Recently his iron therapy was stopped but his hemoglobin has remained in normal range. He reminds me that the source of the GI bleeding was never actually pinpointed. he has a history of high blood pressure with elevated diastolics here in the office but blood pressure often normal at home. 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   Current Outpatient Prescriptions  Medication Sig Dispense Refill  . amLODipine (NORVASC) 10 MG tablet Take 1 tablet (10 mg total) by mouth daily.  30 tablet  3  . aspirin 81 MG EC tablet Take 81 mg by mouth daily.        . carvedilol (COREG) 25 MG tablet Take 25 mg by mouth 2 (two) times daily with a meal.        . Cholecalciferol (VITAMIN D) 2000 UNITS tablet Take 2,000 Units by mouth daily.        . clopidogrel (PLAVIX) 75 MG tablet Take 1 tablet (75 mg total) by mouth daily.  30 tablet  11  . colchicine 0.6 MG tablet Take 0.6 mg by mouth as needed.        . fexofenadine (ALLEGRA) 180 MG tablet Take 180 mg by mouth as needed.        . furosemide (LASIX) 20 MG tablet Take 20 mg by mouth daily as needed.      . nitroGLYCERIN (NITROSTAT) 0.4 MG SL  tablet Place 0.4 mg under the tongue every 5 (five) minutes as needed.        . simvastatin (ZOCOR) 20 MG tablet Take 20 mg by mouth at bedtime.        . valACYclovir (VALTREX) 1000 MG tablet Take 1,000 mg by mouth as needed.         No current facility-administered medications for this visit.    Allergies  Allergen Reactions  . Ace Inhibitors     Cough  . Lisinopril     cough  . Losartan     Cough    Patient Active Problem List  Diagnosis  . Ischemic heart disease  . Benign hypertensive heart disease without heart failure  . Dyslipidemia  . Iron deficiency anemia secondary to blood loss (chronic)    History  Smoking status  . Former Smoker  . Quit date: 04/21/1968  Smokeless tobacco  . Not on file    History  Alcohol Use  . Yes    Comment: occasional    Family History  Problem Relation Age of Onset  . Heart disease Mother   . Heart disease Father     Review of Systems: Constitutional: no fever chills diaphoresis or  fatigue or change in weight.  Head and neck: no hearing loss, no epistaxis, no photophobia or visual disturbance. Respiratory: No cough, shortness of breath or wheezing. Cardiovascular: No chest pain peripheral edema, palpitations. Gastrointestinal: No abdominal distention, no abdominal pain, no change in bowel habits hematochezia or melena. Genitourinary: No dysuria, no frequency, no urgency, no nocturia. Musculoskeletal:No arthralgias, no back pain, no gait disturbance or myalgias. Neurological: No dizziness, no headaches, no numbness, no seizures, no syncope, no weakness, no tremors. Hematologic: No lymphadenopathy, no easy bruising. Psychiatric: No confusion, no hallucinations, no sleep disturbance.    Physical Exam: Filed Vitals:   08/02/12 1055  BP: 118/76  Pulse: 53   the general appearance reveals a well-developed well-nourished elderly gentleman in no distress.The head and neck exam reveals pupils equal and reactive.  Extraocular  movements are full.  There is no scleral icterus.  The mouth and pharynx are normal.  The neck is supple.  The carotids reveal no bruits.  The jugular venous pressure is normal.  The  thyroid is not enlarged.  There is no lymphadenopathy.  The chest is clear to percussion and auscultation.  There are no rales or rhonchi.  Expansion of the chest is symmetrical.  The precordium is quiet.  The first heart sound is normal.  The second heart sound is physiologically split.  There is no murmur gallop rub or click.  There is no abnormal lift or heave.  The abdomen is soft and nontender.  The bowel sounds are normal.  The liver and spleen are not enlarged.  There are no abdominal masses.  There are no abdominal bruits.  Extremities reveal good pedal pulses.  There is no phlebitis or edema.  There is no cyanosis or clubbing.  Strength is normal and symmetrical in all extremities.  There is no lateralizing weakness.  There are no sensory deficits.  The skin is warm and dry.  There is no rash.     Assessment / Plan: Continue current medication.  Try to lose more weight. 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 Recheck in 4 months for followup office visit CBC lipid panel hepatic function panel and basal metabolic panel

## 2012-08-02 NOTE — Patient Instructions (Addendum)
Work harder on [employer] loss  Your physician recommends that you continue on your current medications as directed. Please refer to the Current Medication list given to you today.  Your physician recommends that you schedule a follow-up appointment in: 4 months with fasting labs (lp/bmet/hfp/cbc)

## 2012-08-02 NOTE — Assessment & Plan Note (Addendum)
The patient is no longer anemic.  He does not need to be on iron any longer and it was stopped at his last visit.  Hemoglobin today is 15.7

## 2012-08-02 NOTE — Assessment & Plan Note (Signed)
At his last visit he was complaining of a dry cough and  we stopped his losartan.  His cough improved off of losartan.  Blood pressure has remained stable off losartan

## 2012-10-01 ENCOUNTER — Other Ambulatory Visit: Payer: Self-pay | Admitting: Cardiology

## 2012-10-01 DIAGNOSIS — R0989 Other specified symptoms and signs involving the circulatory and respiratory systems: Secondary | ICD-10-CM

## 2012-10-01 NOTE — Telephone Encounter (Signed)
Okay to use Tussionex 5 cc at bedtime when necessary

## 2012-10-01 NOTE — Telephone Encounter (Signed)
z-pak 

## 2012-10-01 NOTE — Telephone Encounter (Signed)
Left message and advised wife

## 2012-10-01 NOTE — Telephone Encounter (Signed)
Productive cough with colored sputum. Would like cough meds to use at night. Using Mucinex. Denies fever. Will forward to  Dr. Patty Sermons for review

## 2012-10-01 NOTE — Telephone Encounter (Signed)
Was wanting cough meds to use at bedtime

## 2012-10-01 NOTE — Telephone Encounter (Signed)
New Prob      Pt states he has a terrible chest cold and would like a prescription to help him.

## 2012-10-02 MED ORDER — HYDROCOD POLST-CHLORPHEN POLST 10-8 MG/5ML PO LQCR: 5.0000 mL | Freq: Every evening | ORAL | Status: DC | PRN

## 2012-10-02 MED ORDER — AZITHROMYCIN 250 MG PO TABS: ORAL_TABLET | ORAL | Status: DC

## 2012-10-05 ENCOUNTER — Other Ambulatory Visit: Payer: Self-pay | Admitting: *Deleted

## 2012-10-05 DIAGNOSIS — R0989 Other specified symptoms and signs involving the circulatory and respiratory systems: Secondary | ICD-10-CM

## 2012-10-05 MED ORDER — HYDROCOD POLST-CHLORPHEN POLST 10-8 MG/5ML PO LQCR: 5.0000 mL | Freq: Every evening | ORAL | Status: DC | PRN

## 2012-10-05 NOTE — Telephone Encounter (Signed)
Called in hydrocodone.

## 2012-11-05 ENCOUNTER — Encounter (HOSPITAL_COMMUNITY): Payer: Self-pay | Admitting: Emergency Medicine

## 2012-11-05 ENCOUNTER — Emergency Department (HOSPITAL_COMMUNITY): Payer: Medicare Other

## 2012-11-05 ENCOUNTER — Observation Stay (HOSPITAL_COMMUNITY)
Admission: EM | Admit: 2012-11-05 | Discharge: 2012-11-07 | Disposition: A | Discharge status: Home / Self Care | Payer: Medicare Other | Attending: Internal Medicine | Admitting: Internal Medicine

## 2012-11-05 DIAGNOSIS — R0789 Other chest pain: Secondary | ICD-10-CM | POA: Diagnosis present

## 2012-11-05 DIAGNOSIS — K440 Diaphragmatic hernia with obstruction, without gangrene: Secondary | ICD-10-CM

## 2012-11-05 DIAGNOSIS — Z9861 Coronary angioplasty status: Secondary | ICD-10-CM

## 2012-11-05 DIAGNOSIS — K449 Diaphragmatic hernia without obstruction or gangrene: Secondary | ICD-10-CM

## 2012-11-05 DIAGNOSIS — Z955 Presence of coronary angioplasty implant and graft: Secondary | ICD-10-CM

## 2012-11-05 DIAGNOSIS — I252 Old myocardial infarction: Secondary | ICD-10-CM | POA: Insufficient documentation

## 2012-11-05 DIAGNOSIS — M549 Dorsalgia, unspecified: Principal | ICD-10-CM | POA: Insufficient documentation

## 2012-11-05 DIAGNOSIS — I209 Angina pectoris, unspecified: Secondary | ICD-10-CM

## 2012-11-05 DIAGNOSIS — I119 Hypertensive heart disease without heart failure: Secondary | ICD-10-CM | POA: Diagnosis present

## 2012-11-05 DIAGNOSIS — I259 Chronic ischemic heart disease, unspecified: Secondary | ICD-10-CM | POA: Diagnosis present

## 2012-11-05 DIAGNOSIS — E785 Hyperlipidemia, unspecified: Secondary | ICD-10-CM | POA: Diagnosis present

## 2012-11-05 DIAGNOSIS — I208 Other forms of angina pectoris: Secondary | ICD-10-CM

## 2012-11-05 HISTORY — DX: Personal history of other diseases of the digestive system: Z87.19

## 2012-11-05 HISTORY — DX: Anemia, unspecified: D64.9

## 2012-11-05 HISTORY — DX: Unspecified osteoarthritis, unspecified site: M19.90

## 2012-11-05 LAB — CBC
HCT: 42.3 % (ref 39.0–52.0)
Hemoglobin: 15.3 g/dL (ref 13.0–17.0)
MCH: 31.6 pg (ref 26.0–34.0)
MCHC: 36.2 g/dL — ABNORMAL HIGH (ref 30.0–36.0)
MCV: 87.4 fL (ref 78.0–100.0)
Platelets: 165 10*3/uL (ref 150–400)
RBC: 4.84 MIL/uL (ref 4.22–5.81)
RDW: 14.2 % (ref 11.5–15.5)
WBC: 5.7 10*3/uL (ref 4.0–10.5)

## 2012-11-05 LAB — URINALYSIS, ROUTINE W REFLEX MICROSCOPIC
Bilirubin Urine: NEGATIVE
Glucose, UA: >1000 mg/dL — AB
Hgb urine dipstick: NEGATIVE
Ketones, ur: 15 mg/dL — AB
Leukocytes, UA: NEGATIVE
Nitrite: NEGATIVE
Protein, ur: NEGATIVE mg/dL
Specific Gravity, Urine: 1.026 (ref 1.005–1.030)
Urobilinogen, UA: 1 mg/dL (ref 0.0–1.0)
pH: 6.5 (ref 5.0–8.0)

## 2012-11-05 LAB — BASIC METABOLIC PANEL
BUN: 23 mg/dL (ref 6–23)
CO2: 24 mEq/L (ref 19–32)
Calcium: 9 mg/dL (ref 8.4–10.5)
Chloride: 104 mEq/L (ref 96–112)
Creatinine, Ser: 0.89 mg/dL (ref 0.50–1.35)
GFR calc Af Amer: >90 mL/min (ref >90)
GFR calc non Af Amer: 87 mL/min — ABNORMAL LOW (ref >90)
Glucose, Bld: 230 mg/dL — ABNORMAL HIGH (ref 70–99)
Potassium: 4 mEq/L (ref 3.5–5.1)
Sodium: 138 mEq/L (ref 135–145)

## 2012-11-05 LAB — URINE MICROSCOPIC-ADD ON

## 2012-11-05 LAB — POCT I-STAT TROPONIN I: Troponin i, poc: 0 ng/mL (ref 0.00–0.08)

## 2012-11-05 NOTE — ED Notes (Signed)
Patient c/o back pain that started around 2000.  Patient has hx of heart attack and had similar pain when he had a heart attack back in 2007.  Patient took 1 nitro at 2030 and got relief.  Patient is denying chest pain, n/v, and SOB at this time.  Will continue to monitor.

## 2012-11-05 NOTE — ED Provider Notes (Signed)
History    CSN: [id number] Arrival date & time 11/05/12  2132  First MD Initiated Contact with Patient 11/05/12 2231     Chief Complaint  Patient presents with  . Back Pain   (Consider location/radiation/quality/duration/timing/severity/associated sxs/prior Treatment) HPI Comments: Patient reports he was riding a car when he suddenly developed dull pain in his midback that did not radiate.  Pain lasted over one hour and was relieved with nitroglycerin. Denies any associated symptoms but states he was worried because his MI in 2007 presented the exact same way.   Denies CP, SOB, nausea, lightheadedness.  Took an 81mg  aspirin today. Denies any trauma or recent heavy lifting.   The history is provided by the patient.   Past Medical History  Diagnosis Date  . Heart disease     ischemic  . Heart attack 10/29/05    anteroseptal myocardial infaction Taxus stent to LAD  . Hypertension   . Hyperlipidemia    Past Surgical History  Procedure Laterality Date  . Cardiovascular stress test  05/28/2009    EF 57% no reversible ischemia  . Doppler echocardiography  01/06/2007  . Eye surgery    . Shoulder arthroscopy w/ rotator cuff repair     Family History  Problem Relation Age of Onset  . Heart disease Mother   . Heart disease Father    History  Substance Use Topics  . Smoking status: Former Smoker    Quit date: 04/21/1968  . Smokeless tobacco: Not on file  . Alcohol Use: Yes     Comment: occasional    Review of Systems  Constitutional: Negative for fever and chills.  Eyes: Negative for visual disturbance.  Respiratory: Negative for shortness of breath.   Cardiovascular: Negative for chest pain, palpitations and leg swelling.  Gastrointestinal: Negative for nausea and vomiting.  Musculoskeletal: Positive for back pain.  Neurological: Negative for headaches.  All other systems reviewed and are negative.    Allergies  Ace inhibitors; Lisinopril; and Losartan  Home  Medications   Current Outpatient Rx  Name  Route  Sig  Dispense  Refill  . amLODipine (NORVASC) 10 MG tablet   Oral   Take 1 tablet (10 mg total) by mouth daily.   30 tablet   3   . aspirin 81 MG EC tablet   Oral   Take 81 mg by mouth daily.           . carvedilol (COREG) 25 MG tablet   Oral   Take 25 mg by mouth 2 (two) times daily with a meal.           . Cholecalciferol (VITAMIN D) 2000 UNITS tablet   Oral   Take 2,000 Units by mouth daily.           . clopidogrel (PLAVIX) 75 MG tablet   Oral   Take 1 tablet (75 mg total) by mouth daily.   30 tablet   11   . colchicine 0.6 MG tablet   Oral   Take 0.6 mg by mouth daily as needed (gout).          . fexofenadine (ALLEGRA) 180 MG tablet   Oral   Take 180 mg by mouth daily as needed (allergies).          . furosemide (LASIX) 20 MG tablet   Oral   Take 20 mg by mouth daily as needed for fluid or edema.          . simvastatin (ZOCOR)  20 MG tablet   Oral   Take 20 mg by mouth at bedtime.           . valACYclovir (VALTREX) 1000 MG tablet   Oral   Take 1,000 mg by mouth daily as needed (fever blisters).          . nitroGLYCERIN (NITROSTAT) 0.4 MG SL tablet   Sublingual   Place 0.4 mg under the tongue every 5 (five) minutes as needed for chest pain.           BP 151/84  Pulse 84  Temp(Src) 98.9 F (37.2 C) (Oral)  Resp 25  SpO2 96% Physical Exam  Nursing note and vitals reviewed. Constitutional: He appears well-developed and well-nourished. No distress.  HENT:  Head: Normocephalic and atraumatic.  Neck: Neck supple.  Cardiovascular: Normal rate, regular rhythm and intact distal pulses.   Pulmonary/Chest: Effort normal and breath sounds normal. No respiratory distress. He has no wheezes. He has no rales. He exhibits no tenderness.  Abdominal: Soft. He exhibits no distension and no mass. There is no tenderness. There is no rebound and no guarding.  Musculoskeletal: He exhibits no edema.   Back is nontender.  Neurological: He is alert. He exhibits normal muscle tone.  Skin: He is not diaphoretic.    ED Course  Procedures (including critical care time) Labs Reviewed  CBC - Abnormal; Notable for the following:    MCHC 36.2 (*)    All other components within normal limits  BASIC METABOLIC PANEL - Abnormal; Notable for the following:    Glucose, Bld 230 (*)    GFR calc non Af Amer 87 (*)    All other components within normal limits  URINALYSIS, ROUTINE W REFLEX MICROSCOPIC - Abnormal; Notable for the following:    Glucose, UA >1000 (*)    Ketones, ur 15 (*)    All other components within normal limits  URINE MICROSCOPIC-ADD ON  BASIC METABOLIC PANEL  CBC  POCT I-STAT TROPONIN I   Dg Chest Port 1 View  11/05/2012   *RADIOLOGY REPORT*  Clinical Data: Chest pain  PORTABLE CHEST - 1 VIEW  Comparison: 10/29/2005  Findings: Large hiatal hernia.  This obscures underlying cardiac silhouette. Aorta is ectatic and unfolded.  Lungs are clear.  Left lung base obscured by hiatal hernia.  No acute osseous finding.  IMPRESSION: No acute cardiopulmonary process.  Large hiatal hernia.   Original Report Authenticated By: Christiana Pellant, M.D.     Date: 11/05/2012  Rate: 80  Rhythm: normal sinus rhythm  QRS Axis: normal  Intervals: normal  ST/T Wave abnormalities: nonspecific ST/T changes  Conduction Disutrbances:none  Narrative Interpretation: Q waves  Old EKG Reviewed: unchanged   11:21 PM I spoke with Dr Adolm Joseph, [facility] cardiology, who requests medicine admission. He will consult if medicine calls him.    1. Equivalent angina     MDM  Pt with dull pain in his central back that occurred at rest today, lasting approx 1 hour.  No associated symptoms.  Pt states this is the exact same pain he had when he had his MI in 2007.  States he has not had it between 2007 and now.  Cardiologist is Dr Patty Sermons.  Admitted to [facility] after consultation with cardiology.  Discussed findings and  plan with patient.  Pt verbalized understanding and agrees with plan.    [place], PA-C 11/06/12 [phone]

## 2012-11-05 NOTE — ED Provider Notes (Signed)
Patient pain between his shoulder blades today last approximately one hour. Results treatment at treatment with one sublingual nitroglycerin. He is presently asymptomatic. Symptoms identical to "heart attack" he had 2007. Exam no distress lungs the patient heart regular rate and rhythm  Doug Sou, MD 11/05/12 2310

## 2012-11-06 ENCOUNTER — Encounter (HOSPITAL_COMMUNITY): Payer: Self-pay | Admitting: Internal Medicine

## 2012-11-06 DIAGNOSIS — E785 Hyperlipidemia, unspecified: Secondary | ICD-10-CM

## 2012-11-06 DIAGNOSIS — I259 Chronic ischemic heart disease, unspecified: Secondary | ICD-10-CM

## 2012-11-06 DIAGNOSIS — I209 Angina pectoris, unspecified: Secondary | ICD-10-CM

## 2012-11-06 DIAGNOSIS — R0789 Other chest pain: Secondary | ICD-10-CM

## 2012-11-06 DIAGNOSIS — I2510 Atherosclerotic heart disease of native coronary artery without angina pectoris: Secondary | ICD-10-CM

## 2012-11-06 DIAGNOSIS — I119 Hypertensive heart disease without heart failure: Secondary | ICD-10-CM

## 2012-11-06 DIAGNOSIS — I10 Essential (primary) hypertension: Secondary | ICD-10-CM

## 2012-11-06 DIAGNOSIS — R079 Chest pain, unspecified: Secondary | ICD-10-CM

## 2012-11-06 DIAGNOSIS — K440 Diaphragmatic hernia with obstruction, without gangrene: Secondary | ICD-10-CM

## 2012-11-06 DIAGNOSIS — K449 Diaphragmatic hernia without obstruction or gangrene: Secondary | ICD-10-CM

## 2012-11-06 LAB — BASIC METABOLIC PANEL
BUN: 21 mg/dL (ref 6–23)
CO2: 24 mEq/L (ref 19–32)
Calcium: 8.9 mg/dL (ref 8.4–10.5)
Chloride: 107 mEq/L (ref 96–112)
Creatinine, Ser: 0.86 mg/dL (ref 0.50–1.35)
GFR calc Af Amer: >90 mL/min (ref >90)
GFR calc non Af Amer: 88 mL/min — ABNORMAL LOW (ref >90)
Glucose, Bld: 101 mg/dL — ABNORMAL HIGH (ref 70–99)
Potassium: 3.7 mEq/L (ref 3.5–5.1)
Sodium: 141 mEq/L (ref 135–145)

## 2012-11-06 LAB — CBC
HCT: 41 % (ref 39.0–52.0)
Hemoglobin: 14.6 g/dL (ref 13.0–17.0)
MCH: 31.2 pg (ref 26.0–34.0)
MCHC: 35.6 g/dL (ref 30.0–36.0)
MCV: 87.6 fL (ref 78.0–100.0)
Platelets: 142 10*3/uL — ABNORMAL LOW (ref 150–400)
RBC: 4.68 MIL/uL (ref 4.22–5.81)
RDW: 14.3 % (ref 11.5–15.5)
WBC: 5.2 10*3/uL (ref 4.0–10.5)

## 2012-11-06 LAB — TROPONIN I
Troponin I: <0.3 ng/mL (ref <0.30)
Troponin I: <0.3 ng/mL (ref <0.30)
Troponin I: <0.3 ng/mL (ref <0.30)

## 2012-11-06 MED ORDER — ASPIRIN EC 81 MG PO TBEC: 81.0000 mg | DELAYED_RELEASE_TABLET | Freq: Every day | ORAL | Status: DC

## 2012-11-06 MED ORDER — SODIUM CHLORIDE 0.9 % IV SOLN: 250.0000 mL | INTRAVENOUS | Status: DC | PRN

## 2012-11-06 MED ORDER — PANTOPRAZOLE SODIUM 40 MG PO TBEC
40.0000 mg | DELAYED_RELEASE_TABLET | Freq: Every day | ORAL | Status: DC
  Administered 2012-11-06 – 2012-11-07 (×2): 40 mg via ORAL
  Filled 2012-11-06: qty 1

## 2012-11-06 MED ORDER — VITAMIN D 1000 UNITS PO TABS
2000.0000 [IU] | ORAL_TABLET | Freq: Every day | ORAL | Status: DC
  Administered 2012-11-06: 2000 [IU] via ORAL
  Filled 2012-11-06 (×2): qty 2

## 2012-11-06 MED ORDER — ONDANSETRON HCL 4 MG/2ML IJ SOLN: 4.0000 mg | Freq: Four times a day (QID) | INTRAMUSCULAR | Status: DC | PRN

## 2012-11-06 MED ORDER — SIMVASTATIN 20 MG PO TABS
20.0000 mg | ORAL_TABLET | Freq: Every day | ORAL | Status: DC
  Administered 2012-11-06 (×2): 20 mg via ORAL
  Filled 2012-11-06 (×3): qty 1

## 2012-11-06 MED ORDER — NITROGLYCERIN 0.4 MG SL SUBL: 0.4000 mg | SUBLINGUAL_TABLET | SUBLINGUAL | Status: DC | PRN

## 2012-11-06 MED ORDER — SODIUM CHLORIDE 0.9 % IJ SOLN
3.0000 mL | Freq: Two times a day (BID) | INTRAMUSCULAR | Status: DC
  Administered 2012-11-06 (×2): 3 mL via INTRAVENOUS

## 2012-11-06 MED ORDER — ONDANSETRON HCL 4 MG PO TABS: 4.0000 mg | ORAL_TABLET | Freq: Four times a day (QID) | ORAL | Status: DC | PRN

## 2012-11-06 MED ORDER — CARVEDILOL 25 MG PO TABS
25.0000 mg | ORAL_TABLET | Freq: Two times a day (BID) | ORAL | Status: DC
Start: 2012-11-06 — End: 2012-11-07
  Administered 2012-11-06 – 2012-11-07 (×3): 25 mg via ORAL
  Filled 2012-11-06 (×5): qty 1

## 2012-11-06 MED ORDER — CLOPIDOGREL BISULFATE 75 MG PO TABS
75.0000 mg | ORAL_TABLET | Freq: Every day | ORAL | Status: DC
  Administered 2012-11-06: 75 mg via ORAL
  Filled 2012-11-06 (×2): qty 1

## 2012-11-06 MED ORDER — ASPIRIN 325 MG PO TABS
325.0000 mg | ORAL_TABLET | Freq: Every day | ORAL | Status: DC
  Administered 2012-11-06: 325 mg via ORAL
  Filled 2012-11-06 (×2): qty 1

## 2012-11-06 MED ORDER — ENOXAPARIN SODIUM 40 MG/0.4ML ~~LOC~~ SOLN
40.0000 mg | SUBCUTANEOUS | Status: DC
  Administered 2012-11-06: 40 mg via SUBCUTANEOUS
  Filled 2012-11-06 (×2): qty 0.4

## 2012-11-06 MED ORDER — ALUM & MAG HYDROXIDE-SIMETH 200-200-20 MG/5ML PO SUSP: 30.0000 mL | Freq: Four times a day (QID) | ORAL | Status: DC | PRN

## 2012-11-06 MED ORDER — LORATADINE 10 MG PO TABS
10.0000 mg | ORAL_TABLET | Freq: Every day | ORAL | Status: DC
  Administered 2012-11-06: 10 mg via ORAL
  Filled 2012-11-06 (×2): qty 1

## 2012-11-06 MED ORDER — AMLODIPINE BESYLATE 10 MG PO TABS
10.0000 mg | ORAL_TABLET | Freq: Every day | ORAL | Status: DC
  Administered 2012-11-06: 10 mg via ORAL
  Filled 2012-11-06 (×2): qty 1

## 2012-11-06 MED ORDER — OXYCODONE HCL 5 MG PO TABS: 5.0000 mg | ORAL_TABLET | ORAL | Status: DC | PRN

## 2012-11-06 MED ORDER — ACETAMINOPHEN 325 MG PO TABS
650.0000 mg | ORAL_TABLET | Freq: Four times a day (QID) | ORAL | Status: DC | PRN
  Administered 2012-11-06: 650 mg via ORAL
  Filled 2012-11-06: qty 2

## 2012-11-06 MED ORDER — NITROGLYCERIN 2 % TD OINT
1.0000 [in_us] | TOPICAL_OINTMENT | Freq: Four times a day (QID) | TRANSDERMAL | Status: DC
  Administered 2012-11-06 – 2012-11-07 (×5): 1 [in_us] via TOPICAL
  Filled 2012-11-06: qty 30

## 2012-11-06 MED ORDER — GI COCKTAIL ~~LOC~~
30.0000 mL | Freq: Three times a day (TID) | ORAL | Status: DC | PRN
  Filled 2012-11-06: qty 30

## 2012-11-06 MED ORDER — FUROSEMIDE 20 MG PO TABS
20.0000 mg | ORAL_TABLET | Freq: Every day | ORAL | Status: DC | PRN
  Filled 2012-11-06: qty 1

## 2012-11-06 MED ORDER — SODIUM CHLORIDE 0.9 % IJ SOLN
3.0000 mL | INTRAMUSCULAR | Status: DC | PRN
  Administered 2012-11-06: 3 mL via INTRAVENOUS

## 2012-11-06 MED ORDER — HYDROMORPHONE HCL PF 1 MG/ML IJ SOLN: 0.5000 mg | INTRAMUSCULAR | Status: DC | PRN

## 2012-11-06 MED ORDER — ZOLPIDEM TARTRATE 5 MG PO TABS: 5.0000 mg | ORAL_TABLET | Freq: Every evening | ORAL | Status: DC | PRN

## 2012-11-06 MED ORDER — SODIUM CHLORIDE 0.9 % IJ SOLN
3.0000 mL | Freq: Two times a day (BID) | INTRAMUSCULAR | Status: DC
  Administered 2012-11-06 (×3): 3 mL via INTRAVENOUS

## 2012-11-06 MED ORDER — ACETAMINOPHEN 650 MG RE SUPP: 650.0000 mg | Freq: Four times a day (QID) | RECTAL | Status: DC | PRN

## 2012-11-06 NOTE — Progress Notes (Signed)
  Echocardiogram 2D Echocardiogram has been performed.  [place] 11/06/2012, 4:27 PM

## 2012-11-06 NOTE — ED Provider Notes (Signed)
Medical screening examination/treatment/procedure(s) were conducted as a shared visit with non-physician practitioner(s) and myself.  I personally evaluated the patient during the encounter  Doug Sou, MD 11/06/12 [phone]

## 2012-11-06 NOTE — H&P (Signed)
[facility] History and Physical  [name]:[id number] DOB: October 02, 1945 DOA: 11/05/2012  Referring physician:  EDP PCP: Kaleen Mask, MD  Specialists: [facility]: Patty Sermons  Chief Complaint:  Pain in Back Between Shoulders.    HPI: Douglas [name] is a 67 y.o. male with a history of SCD, previous MI in 2007 who presents to the ED with complaints of 6/10 dull pain between both shoulder blades that lasted 1.5 hours.   He reports having SOB, but denies Nausea vomiting and diaphoresis.   He states that the pain felt just like the pain he had before when he was having an MI.   He was evaluated in the ED and was found to have a negative workup, and was referred for medical admission.     Review of Systems: The patient denies anorexia, fever, chills, headaches, weight loss, vision loss, diplopia, dizziness, decreased hearing, rhinitis, hoarseness, chest pain, syncope, dyspnea on exertion, peripheral edema, balance deficits, cough, hemoptysis, abdominal pain, nausea, vomiting, diarrhea, constipation, hematemesis, melena, hematochezia, severe indigestion/heartburn, dysuria, hematuria, incontinence, muscle weakness, suspicious skin lesions, transient blindness, difficulty walking, depression, unusual weight change, abnormal bleeding, enlarged lymph nodes, angioedema, and breast masses.    Past Medical History  Diagnosis Date  . Heart disease     ischemic  . Heart attack 10/29/05    anteroseptal myocardial infaction Taxus stent to LAD  . Hypertension   . Hyperlipidemia     Past Surgical History  Procedure Laterality Date  . Cardiovascular stress test  05/28/2009    EF 57% no reversible ischemia  . Doppler echocardiography  01/06/2007  . Eye surgery    . Shoulder arthroscopy w/ rotator cuff repair      Prior to Admission medications   Medication Sig Start Date End Date Taking? Authorizing Provider  amLODipine (NORVASC) 10 MG tablet Take 1 tablet (10 mg total) by mouth  daily. 07/27/12  Yes Cassell Clement, MD  aspirin 81 MG EC tablet Take 81 mg by mouth daily.     Yes Historical Provider, MD  carvedilol (COREG) 25 MG tablet Take 25 mg by mouth 2 (two) times daily with a meal.     Yes Historical Provider, MD  Cholecalciferol (VITAMIN D) 2000 UNITS tablet Take 2,000 Units by mouth daily.     Yes Historical Provider, MD  clopidogrel (PLAVIX) 75 MG tablet Take 1 tablet (75 mg total) by mouth daily. 12/15/11  Yes Cassell Clement, MD  colchicine 0.6 MG tablet Take 0.6 mg by mouth daily as needed (gout).    Yes Historical Provider, MD  fexofenadine (ALLEGRA) 180 MG tablet Take 180 mg by mouth daily as needed (allergies).    Yes Historical Provider, MD  furosemide (LASIX) 20 MG tablet Take 20 mg by mouth daily as needed for fluid or edema.  10/14/10  Yes Cassell Clement, MD  simvastatin (ZOCOR) 20 MG tablet Take 20 mg by mouth at bedtime.     Yes Historical Provider, MD  valACYclovir (VALTREX) 1000 MG tablet Take 1,000 mg by mouth daily as needed (fever blisters).    Yes Historical Provider, MD  nitroGLYCERIN (NITROSTAT) 0.4 MG SL tablet Place 0.4 mg under the tongue every 5 (five) minutes as needed for chest pain.     Historical Provider, MD    Allergies  Allergen Reactions  . Ace Inhibitors     Cough  . Lisinopril     cough  . Losartan     Cough    Social History:  reports that  he quit smoking about 44 years ago. He does not have any smokeless tobacco history on file. He reports that  drinks alcohol. His drug history is not on file.     Family History  Problem Relation Age of Onset  . Heart disease Mother   . Heart disease Father   . Diabetes Mother   . Diabetes Father         Hypertension in Both Parents             Colon Cancer in Mother  Physical Exam:  GEN:  Pleasant  Elderly Overwieght 67 y.o.  Caucasian male  examined  and in no acute distress; cooperative with exam Filed Vitals:   11/05/12 2141 11/05/12 2215 11/05/12 2315 11/06/12 0015  BP:  151/84 127/78 127/77 121/74  Pulse: 84 80 74 69  Temp: 98.9 F (37.2 C)     TempSrc: Oral     Resp: 25 27 20 24   SpO2: 96% 91% 95% 96%   Blood pressure 121/74, pulse 69, temperature 98.9 F (37.2 C), temperature source Oral, resp. rate 24, SpO2 96.00%. PSYCH: He is alert and oriented x4; does not appear anxious does not appear depressed; affect is normal HEENT: Normocephalic and Atraumatic, Mucous membranes pink; PERRLA; EOM intact; Fundi:  Benign;  No scleral icterus, Nares: Patent, Oropharynx: Clear, Fair Dentition;  Neck: supple FROM, No Thyromegaly, No JVD, No Carotid Bruits; Breasts:: Not examined CHEST WALL: No tenderness CHEST: Normal respiration, clear to auscultation bilaterally HEART: Regular rate and rhythm; no murmurs rubs or gallops BACK: No kyphosis or scoliosis; no CVA tenderness ABDOMEN: Positive Bowel Sounds, Obese, soft non-tender; no masses, no organomegaly, no pannus; no intertriginous candida. Rectal Exam: Not done EXTREMITIES: No cyanosis, clubbing or edema; no ulcerations. Genitalia: not examined PULSES: 2+ and symmetric SKIN: Normal hydration no rash or ulceration CNS: Cranial nerves 2-12 grossly intact no focal neurologic deficit    Labs on Admission:  Basic Metabolic Panel:  Recent Labs Lab 11/05/12 2147  NA 138  K 4.0  CL 104  CO2 24  GLUCOSE 230*  BUN 23  CREATININE 0.89  CALCIUM 9.0   Liver Function Tests: No results found for this basename: AST, ALT, ALKPHOS, BILITOT, PROT, ALBUMIN,  in the last 168 hours No results found for this basename: LIPASE, AMYLASE,  in the last 168 hours No results found for this basename: AMMONIA,  in the last 168 hours CBC:  Recent Labs Lab 11/05/12 2147  WBC 5.7  HGB 15.3  HCT 42.3  MCV 87.4  PLT 165   Cardiac Enzymes: No results found for this basename: CKTOTAL, CKMB, CKMBINDEX, TROPONINI,  in the last 168 hours  BNP (last 3 results) No results found for this basename: PROBNP,  in the last 8760  hours CBG: No results found for this basename: GLUCAP,  in the last 168 hours  Radiological Exams on Admission: Dg Chest Port 1 View  11/05/2012   *RADIOLOGY REPORT*  Clinical Data: Chest pain  PORTABLE CHEST - 1 VIEW  Comparison: 10/29/2005  Findings: Large hiatal hernia.  This obscures underlying cardiac silhouette. Aorta is ectatic and unfolded.  Lungs are clear.  Left lung base obscured by hiatal hernia.  No acute osseous finding.  IMPRESSION: No acute cardiopulmonary process.  Large hiatal hernia.   Original Report Authenticated By: Christiana Pellant, M.D.     EKG:  Ordered   Assessment/Plan Principal Problem:   Atypical chest pain Active Problems:   Benign hypertensive heart disease without heart failure  Dyslipidemia   Ischemic heart disease    1.  Atypical Chest Pain-   Hx of Ischemic Heart Disease, Admit to Telemetry, cycle troponins, nitropaste, O2, and ASA Rx.   Ordered EKG.     2.  HTN- Continue Amlodipine, monitor BPs.    3.  Dyslipidemia-  Continue simvastatin, check fasting lipids in AM.    4.   DVT prophylaxis with Lovenox.        Code Status:   FULL CODE Family Communication:   Wife and  Sister at Bedside   Disposition Plan:   Return to Home on discharge  Time spent:  80 Minutes  Ron Parker [facility] Pager [phone]  If 7PM-7AM, please contact night-coverage [email or web address] Password [facility] 11/06/2012, 12:41 AM

## 2012-11-06 NOTE — Progress Notes (Signed)
I have seen and assessed the patient and agree with Dr. Lovell Sheehan assessment and plan. Patient does have a history of a large anterior wall MI in July of 2007. Patient also with a nuclear stress test in February of 2011 which was negative. Patient does have a large scar involving the apex and anterior distal and anteroseptal walls with EF of 57%. Patient also with risk factors of hyperlipidemia and hypertension and prior history of tobacco abuse quit in 1970. Patient had presented with back pain which he describes as dull and constant occurring at rest with some associated burning with it, and relieved with nitroglycerin. Patient states very similar to the symptoms he presented with when he had his MI in July of 2007. EKG is unchanged. Will cycle cardiac enzymes every 6 hours x3. Will check a 2-D echo. Check a fasting lipid panel. Chest x-ray does show a large hiatal hernia. Will place on a PPI. GI cocktail when necessary. Will consult with cardiology for further evaluation and management.

## 2012-11-06 NOTE — Progress Notes (Signed)
Patient ID: Douglas [name], male   DOB: [birth year], 67 y.o.   MRN: [id number]    CARDIOLOGY CONSULT NOTE  Patient ID: Douglas [name] MRN: [id number] DOB/AGE: October 17, 1945 67 y.o.  Admit date: 11/05/2012 Primary Physician: Kaleen Mask, MD Reason for Consultation: chest pain  HPI:  [name] is a 67 year old gentleman with a history of ischemic heart disease. He had a large anterior wall myocardial infarction in July 2007, which apparently required a Taxus stent to the LAD.  A nuclear stress test in February 2011 showed no reversible ischemia. He does have a large scar involving the apex and the anterior distal and anteroseptal walls. His ejection fraction was 57%.   He also has a past history of chronic GI bleeding managed and had been on low-dose intermittent oral iron therapy. The source of the GI bleeding was never actually pinpointed. He has a history of high blood pressure with elevated diastolic pressure in particular.  Yesterday, after driving home from supper, he experienced back discomfort. He says this was reminiscent of some of the symptoms he experienced at the time of his MI. However, at that time, he also experienced severe circumferential neck tightness, which he did not have yesterday. He also denies associated shortness of breath, palpitations, lightheadedness, and near-syncope.  He admits to eating quite a bit of seafood yesterday. He checked his BP and it was 164/114 mmHg. He took a SL nitro and got relief. He had not taken any SL nitro tablets since coming home from the hospital after his stent. He did say he also experienced some dysphagia and nausea after eating.  A chest x-ray has revealed a large hiatal hernia.  He currently feels very well, and hasn't had any recurrence of symptoms. 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    Review of systems complete and found to be negative unless listed  above in HPI  Past Medical History: Taxus stent to LAD in July 2007  Social History: reports that he quit smoking several years ago. 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   Family History  Problem Relation Age of Onset  . Heart disease Mother   . Heart disease Father   . Diabetes Mother   . Diabetes Father     History   Social History  . Marital Status: Married    Spouse Name: N/A    Number of Children: N/A  . Years of Education: N/A   Occupational History  . Not on file.   Social History Main Topics  . Smoking status: Former Smoker    Quit date: 04/21/1968  . Smokeless tobacco: Former Neurosurgeon    Quit date: 04/21/1978  . Alcohol Use: Yes     Comment: occasional  . Drug Use: No  . Sexually Active: Yes   Other Topics Concern  . Not on file   Social History Narrative  . No narrative on file     Prescriptions prior to admission  Medication Sig Dispense Refill  . amLODipine (NORVASC) 10 MG tablet Take 1 tablet (10 mg total) by mouth daily.  30 tablet  3  . aspirin 81 MG EC tablet Take 81 mg by mouth daily.        . carvedilol (COREG) 25 MG tablet Take 25 mg by mouth 2 (two) times daily with a  meal.        . Cholecalciferol (VITAMIN D) 2000 UNITS tablet Take 2,000 Units by mouth daily.        . clopidogrel (PLAVIX) 75 MG tablet Take 1 tablet (75 mg total) by mouth daily.  30 tablet  11  . colchicine 0.6 MG tablet Take 0.6 mg by mouth daily as needed (gout).       . fexofenadine (ALLEGRA) 180 MG tablet Take 180 mg by mouth daily as needed (allergies).       . furosemide (LASIX) 20 MG tablet Take 20 mg by mouth daily as needed for fluid or edema.       . simvastatin (ZOCOR) 20 MG tablet Take 20 mg by mouth at bedtime.        . valACYclovir (VALTREX) 1000 MG tablet Take 1,000 mg by mouth daily as needed (fever blisters).       . nitroGLYCERIN (NITROSTAT) 0.4 MG SL tablet Place 0.4 mg under the  tongue every 5 (five) minutes as needed for chest pain.         Physical exam Blood pressure 98/64, pulse 62, temperature 98.7 F (37.1 C), temperature source Oral, resp. rate 19, height 5\' 7"  (1.702 m), weight 198 lb 13.7 oz (90.2 kg), SpO2 95.00%. General: NAD Neck: No JVD, no thyromegaly or thyroid nodule.  Lungs: Clear to auscultation bilaterally with normal respiratory effort. CV: Nondisplaced PMI.  Heart regular S1/S2, no S3/S4, no murmur.  No peripheral edema.  No carotid bruit.  Normal pedal pulses.  Abdomen: Soft, nontender, no hepatosplenomegaly, no distention.  Skin: Intact without lesions or rashes.  Neurologic: Alert and oriented x 3.  Psych: Normal affect. Extremities: No clubbing or cyanosis.  HEENT: Normal.   Labs:   Lab Results  Component Value Date   WBC 5.2 11/06/2012   HGB 14.6 11/06/2012   HCT 41.0 11/06/2012   MCV 87.6 11/06/2012   PLT 142* 11/06/2012    Recent Labs Lab 11/06/12 0625  NA 141  K 3.7  CL 107  CO2 24  BUN 21  CREATININE 0.86  CALCIUM 8.9  GLUCOSE 101*   Lab Results  Component Value Date   TROPONINI <0.30 11/06/2012    Lab Results  Component Value Date   CHOL 128 07/29/2012   CHOL 130 03/29/2012   CHOL 125 11/28/2011   Lab Results  Component Value Date   HDL 29.40* 07/29/2012   HDL 30.50* 03/29/2012   HDL 35.20* 11/28/2011   Lab Results  Component Value Date   LDLCALC 75 07/29/2012   LDLCALC 71 03/29/2012   LDLCALC 65 11/28/2011   Lab Results  Component Value Date   TRIG 118.0 07/29/2012   TRIG 142.0 03/29/2012   TRIG 126.0 11/28/2011   Lab Results  Component Value Date   CHOLHDL 4 07/29/2012   CHOLHDL 4 03/29/2012   CHOLHDL 4 11/28/2011   No results found for this basename: LDLDIRECT       EKG: Sinus rhythm, rate 68 bpm, old anterior MI.  Radiology (7/18): IMPRESSION:  No acute cardiopulmonary process.  Large hiatal hernia.    ASSESSMENT AND PLAN:  1. Chest pain in the setting of known CAD with an LAD stent: Thus far, he  has had 2 normal troponins and his ECG shows an old anterior infarct. He gets a fair amount of exertion at home with his horses and experiences no chest/back discomfort. He also admitted to experiencing both dysphagia and nausea after eating yesterday, and it was temporally  related to his back discomfort. By history at least, it appears to be atypical for a cardiovascular etiology. If he has one additional normal troponin, I would recommend an outpatient stress test. He says he already has follow-up with Dr. Patty Sermons in September. He is on good medical therapy, which includes ASA, Amlodipine, Carvedilol, Clopidogrel, and Simvastatin.  I agree with the addition of a PPI given his hiatal hernia and dysphagia, which may really be the source of his symptoms. 2. HTN: currently controlled. Would continue to monitor.  Signed: Prentice Docker, M.D., F.A.C.C. 11/06/2012, 2:57 PM

## 2012-11-06 NOTE — Progress Notes (Signed)
Utilization review complete 

## 2012-11-07 DIAGNOSIS — I209 Angina pectoris, unspecified: Secondary | ICD-10-CM

## 2012-11-07 LAB — BASIC METABOLIC PANEL
BUN: 18 mg/dL (ref 6–23)
CO2: 25 mEq/L (ref 19–32)
Calcium: 8.9 mg/dL (ref 8.4–10.5)
Chloride: 104 mEq/L (ref 96–112)
Creatinine, Ser: 0.9 mg/dL (ref 0.50–1.35)
GFR calc Af Amer: >90 mL/min (ref >90)
GFR calc non Af Amer: 86 mL/min — ABNORMAL LOW (ref >90)
Glucose, Bld: 105 mg/dL — ABNORMAL HIGH (ref 70–99)
Potassium: 4 mEq/L (ref 3.5–5.1)
Sodium: 138 mEq/L (ref 135–145)

## 2012-11-07 MED ORDER — PANTOPRAZOLE SODIUM 40 MG PO TBEC: 40.0000 mg | DELAYED_RELEASE_TABLET | Freq: Every day | ORAL | Status: DC

## 2012-11-07 NOTE — Discharge Summary (Signed)
Physician Discharge Summary  Douglas [name] ZOX:[id number] DOB: 07-Nov-1945 DOA: 11/05/2012  PCP: Kaleen Mask, MD  Admit date: 11/05/2012 Discharge date: 11/07/2012  Time spent: 60 minutes  Recommendations for Outpatient Follow-up:  1. Patient is to followup with Dr. Patty Sermons in 1-2 weeks. 2. Patient will be called to be set up for outpatient stress test.  Discharge Diagnoses:  Principal Problem:   Atypical chest pain Active Problems:   Ischemic heart disease   Benign hypertensive heart disease without heart failure   Dyslipidemia   Hiatal hernia   Stented coronary artery   Discharge Condition: Stable and improved.  Diet recommendation: Heart healthy.  Filed Weights   11/06/12 0100 11/06/12 0528 11/07/12 0532  Weight: 90.9 kg (200 lb 6.4 oz) 90.2 kg (198 lb 13.7 oz) 90.4 kg (199 lb 4.7 oz)    History of present illness:  Douglas [name] is a 67 y.o. male with a history of SCD, previous MI in 2007 who presents to the ED with complaints of 6/10 dull pain between both shoulder blades that lasted 1.5 hours. He reports having SOB, but denies Nausea vomiting and diaphoresis. He states that the pain felt just like the pain he had before when he was having an MI. He was evaluated in the ED and was found to have a negative workup, and was referred for medical admission.   Hospital Course:  #1 angina equivalent/atypical chest pain/back pain Patient was admitted with dull pain between shoulder blades in the setting of known coronary artery disease in the LAD stent. On admission patient had stated that this pain was similar to pain he had on his prior MI. Patient endorsed a burning sensation as well as some nausea with it. Patient was admitted placed on telemetry and followed. Cardiac enzymes were cycled which were negative x3. EKG showed old anterior infarct. Chest x-ray done showed a large hiatal hernia. 2-D echo was obtained an EF of 55-60% with mild hypokinesis of the anterior  apical wall which was similar to prior 2-D echo. Cardiology consultation was obtained and patient was seen in consultation by Dr. Purvis Sheffield. Patient's chest pain resolved did not have any further chest pain throughout the hospitalization. Patient was also placed on a PPI. Patient was maintained on his home regimen of Norvasc, aspirin, Coreg, Plavix, Zocor. Patient was seen by cardiology was felt that as patient was chest pain-free with the negative cardiac enzymes and unchanged 2-D echo patient would benefit from outpatient stress test. Patient be discharged home in stable and improved condition to followup with his cardiologist as outpatient and will be set up for outpatient stress test.  #2 hypertension Remained stable throughout the hospitalization. Patient was maintained on Norvasc, Coreg.  #3 hyperlipidemia Patient was maintained on Zocor.  #4 coronary artery disease status post stent See problem #1. Patient was maintained on his home regimen of Norvasc, aspirin, Coreg, Plavix, Zocor.  The rest of patient's chronic medical issues remained stable throughout the hospitalization and patient be discharged in stable and improved condition.  Procedures:  2-D echo 11/06/2012  Chest x-ray 11/05/2012  Consultations:  Cardiology: Dr Purvis Sheffield 11/06/12  Discharge Exam: Filed Vitals:   11/06/12 1408 11/06/12 2021 11/07/12 0011 11/07/12 0532  BP: 98/64 111/72 101/64 132/80  Pulse: 62 62 67 62  Temp: 98.7 F (37.1 C) 98 F (36.7 C) 98.2 F (36.8 C) 97.8 F (36.6 C)  TempSrc: Oral Oral Oral Oral  Resp: 19 18 18 18   Height:      Weight:  90.4 kg (199 lb 4.7 oz)  SpO2: 95% 93% 93% 93%    General: NAD Cardiovascular: RRR Respiratory: CTAB  Discharge Instructions  Discharge Orders   Future Appointments Provider Department Dept Phone   12/16/2012 7:30 AM [facility] Main Office [place]) [phone]   12/22/2012 10:45 AM Cassell Clement, MD [facility]  Main Office [place]) [phone]   Future Orders Complete By Expires     Diet - low sodium heart healthy  As directed     Discharge instructions  As directed     Comments:      Follow up with Dr Patty Sermons in 1-2 weeks.    Increase activity slowly  As directed         Medication List         amLODipine 10 MG tablet  Commonly known as:  NORVASC  Take 1 tablet (10 mg total) by mouth daily.     aspirin 81 MG EC tablet  Take 81 mg by mouth daily.     carvedilol 25 MG tablet  Commonly known as:  COREG  Take 25 mg by mouth 2 (two) times daily with a meal.     clopidogrel 75 MG tablet  Commonly known as:  PLAVIX  Take 1 tablet (75 mg total) by mouth daily.     colchicine 0.6 MG tablet  Take 0.6 mg by mouth daily as needed (gout).     fexofenadine 180 MG tablet  Commonly known as:  ALLEGRA  Take 180 mg by mouth daily as needed (allergies).     furosemide 20 MG tablet  Commonly known as:  LASIX  Take 20 mg by mouth daily as needed for fluid or edema.     nitroGLYCERIN 0.4 MG SL tablet  Commonly known as:  NITROSTAT  Place 0.4 mg under the tongue every 5 (five) minutes as needed for chest pain.     pantoprazole 40 MG tablet  Commonly known as:  PROTONIX  Take 1 tablet (40 mg total) by mouth daily at 6 (six) AM.     simvastatin 20 MG tablet  Commonly known as:  ZOCOR  Take 20 mg by mouth at bedtime.     valACYclovir 1000 MG tablet  Commonly known as:  VALTREX  Take 1,000 mg by mouth daily as needed (fever blisters).     Vitamin D 2000 UNITS tablet  Take 2,000 Units by mouth daily.       Allergies  Allergen Reactions  . Ace Inhibitors     Cough  . Lisinopril     cough  . Losartan     Cough       Follow-up Information   Follow up with Cassell Clement, MD. Schedule an appointment as soon as possible for a visit in 2 weeks. (f/u in 1-2 weeks)    Contact information:   [address] [place] [address] [phone]       Please follow  up. (Dr Patty Sermons office/[place] [facility] will call with appointment for outpatient stress test.)        The results of significant diagnostics from this hospitalization (including imaging, microbiology, ancillary and laboratory) are listed below for reference.    Significant Diagnostic Studies: Dg Chest Port 1 View  11/05/2012   *RADIOLOGY REPORT*  Clinical Data: Chest pain  PORTABLE CHEST - 1 VIEW  Comparison: 10/29/2005  Findings: Large hiatal hernia.  This obscures underlying cardiac silhouette. Aorta is ectatic and unfolded.  Lungs are clear.  Left lung base obscured by hiatal hernia.  No acute osseous finding.  IMPRESSION: No acute cardiopulmonary process.  Large hiatal hernia.   Original Report Authenticated By: Christiana Pellant, M.D.    Microbiology: No results found for this or any previous visit (from the past 240 hour(s)).   Labs: Basic Metabolic Panel:  Recent Labs Lab 11/05/12 2147 11/06/12 0625 11/07/12 0345  NA 138 141 138  K 4.0 3.7 4.0  CL 104 107 104  CO2 24 24 25   GLUCOSE 230* 101* 105*  BUN 23 21 18   CREATININE 0.89 0.86 0.90  CALCIUM 9.0 8.9 8.9   Liver Function Tests: No results found for this basename: AST, ALT, ALKPHOS, BILITOT, PROT, ALBUMIN,  in the last 168 hours No results found for this basename: LIPASE, AMYLASE,  in the last 168 hours No results found for this basename: AMMONIA,  in the last 168 hours CBC:  Recent Labs Lab 11/05/12 2147 11/06/12 0625  WBC 5.7 5.2  HGB 15.3 14.6  HCT 42.3 41.0  MCV 87.4 87.6  PLT 165 142*   Cardiac Enzymes:  Recent Labs Lab 11/06/12 0724 11/06/12 1413 11/06/12 1948  TROPONINI <0.30 <0.30 <0.30   BNP: BNP (last 3 results) No results found for this basename: PROBNP,  in the last 8760 hours CBG: No results found for this basename: GLUCAP,  in the last 168 hours     Signed:  [facility]  [facility] 11/07/2012, 8:43 AM

## 2012-11-10 ENCOUNTER — Telehealth: Payer: Self-pay | Admitting: Cardiology

## 2012-11-10 DIAGNOSIS — R079 Chest pain, unspecified: Secondary | ICD-10-CM

## 2012-11-10 NOTE — Telephone Encounter (Signed)
Spoke with wife and he was in hospital recently. An outpatient stress test was recommended, will send to [facility] to schedule myoview

## 2012-11-10 NOTE — Telephone Encounter (Signed)
New problem   pts wife calling to see if pt needs a stress test

## 2012-11-18 ENCOUNTER — Ambulatory Visit (HOSPITAL_COMMUNITY): Payer: Medicare Other | Attending: Cardiology | Admitting: Radiology

## 2012-11-18 VITALS — BP 127/90 | HR 53 | Ht 67.0 in | Wt 197.0 lb

## 2012-11-18 DIAGNOSIS — Z8249 Family history of ischemic heart disease and other diseases of the circulatory system: Secondary | ICD-10-CM | POA: Insufficient documentation

## 2012-11-18 DIAGNOSIS — R0989 Other specified symptoms and signs involving the circulatory and respiratory systems: Secondary | ICD-10-CM | POA: Insufficient documentation

## 2012-11-18 DIAGNOSIS — Z9861 Coronary angioplasty status: Secondary | ICD-10-CM | POA: Insufficient documentation

## 2012-11-18 DIAGNOSIS — Z87891 Personal history of nicotine dependence: Secondary | ICD-10-CM | POA: Insufficient documentation

## 2012-11-18 DIAGNOSIS — R079 Chest pain, unspecified: Secondary | ICD-10-CM | POA: Insufficient documentation

## 2012-11-18 DIAGNOSIS — I10 Essential (primary) hypertension: Secondary | ICD-10-CM | POA: Insufficient documentation

## 2012-11-18 DIAGNOSIS — R0609 Other forms of dyspnea: Secondary | ICD-10-CM | POA: Insufficient documentation

## 2012-11-18 DIAGNOSIS — R0602 Shortness of breath: Secondary | ICD-10-CM

## 2012-11-18 DIAGNOSIS — I209 Angina pectoris, unspecified: Secondary | ICD-10-CM

## 2012-11-18 DIAGNOSIS — I2510 Atherosclerotic heart disease of native coronary artery without angina pectoris: Secondary | ICD-10-CM

## 2012-11-18 DIAGNOSIS — M549 Dorsalgia, unspecified: Secondary | ICD-10-CM | POA: Insufficient documentation

## 2012-11-18 DIAGNOSIS — E785 Hyperlipidemia, unspecified: Secondary | ICD-10-CM | POA: Insufficient documentation

## 2012-11-18 MED ORDER — TECHNETIUM TC 99M SESTAMIBI GENERIC - CARDIOLITE
33.0000 | Freq: Once | INTRAVENOUS | Status: AC | PRN
  Administered 2012-11-18: 33 via INTRAVENOUS

## 2012-11-18 MED ORDER — REGADENOSON 0.4 MG/5ML IV SOLN
0.4000 mg | Freq: Once | INTRAVENOUS | Status: AC
  Administered 2012-11-18: 0.4 mg via INTRAVENOUS

## 2012-11-18 MED ORDER — TECHNETIUM TC 99M SESTAMIBI GENERIC - CARDIOLITE
11.0000 | Freq: Once | INTRAVENOUS | Status: AC | PRN
  Administered 2012-11-18: 11 via INTRAVENOUS

## 2012-11-18 NOTE — Progress Notes (Signed)
Douglas [name] [address] NUCLEAR MED [address] [place], [place] [address] [phone]    Cardiology Nuclear Med Study  Douglas [name] is a 67 y.o. male     MRN : [id number]     DOB: 01-03-1946  Procedure Date: 11/18/2012  Nuclear Med Background Indication for Stress Test:  Evaluation for Ischemia and PTCA/Stent Patency History:  '07 AWMI>PTCA/Stent-LAD; '11 OZH:YQMVH apical, anterior, distal and atrial-septal scar, no ischemia. EF=57%; 7/14 Echo:EF=60%; 11/07/12 Hospital with chest pain, negative enzymes Cardiac Risk Factors: Family History - CAD, History of Smoking, Hypertension and Lipids  Symptoms:  Back Pain/Neck Tightness (last episode of discomfort: none since discharge) and DOE   Nuclear Pre-Procedure Caffeine/Decaff Intake:  None>12 hrs NPO After: 8:00pm   Lungs:  Clear. O2 Sat: 95% on room air. IV 0.9% NS with Angio Cath:  22g  IV Site: R Hand x 1, tolerated well IV Started by:  Irean Hong, RN  Chest Size (in):  44 Cup Size: n/a  Height: 5\' 7"  (1.702 m)  Weight:  197 lb (89.359 kg)  BMI:  Body mass index is 30.85 kg/(m^2). Tech Comments:  Held Coreg x 24 hours; took Norvasc this am    Nuclear Med Study 1 or 2 day study: 1 day  Stress Test Type:  Treadmill/Lexiscan  Reading MD: Cassell Clement, MD  Order Authorizing Provider:  Cassell Clement, MD  Resting Radionuclide: Technetium 25m Sestamibi  Resting Radionuclide Dose: 11.0 mCi   Stress Radionuclide:  Technetium 32m Sestamibi  Stress Radionuclide Dose: 33.0 mCi           Stress Protocol Rest HR: 53 Stress HR: 110  Rest BP Sitting: 127/90  Standing:132/102 Stress BP: 179/104  Exercise Time (min): 7:00 METS: 7.0   Predicted Max HR: 153 bpm % Max HR: 71.9 bpm Rate Pressure Product: 84696   Dose of Adenosine (mg):  n/a Dose of Lexiscan: 0.4 mg  Dose of Atropine (mg): n/a Dose of Dobutamine: n/a mcg/kg/min (at max HR)  Stress Test Technologist: Smiley Houseman, CMA-N  Nuclear Technologist:  Domenic Polite, CNMT     Rest Procedure:  Myocardial perfusion imaging was performed at rest 45 minutes following the intravenous administration of Technetium 65m Sestamibi.  Rest ECG: NSR - Normal EKG  Stress Procedure: The patient initially walked on the treadmill utilizing the Bruce protocol for seven minutes, but was unable to reach his target heart rate.   He was then given IV Lexiscan 0.4 mg over 15-seconds with concurrent low level exercise and then Technetium 72m Sestamibi was injected at 30-seconds while the patient continued walking one more minute.  Quantitative spect images were obtained after a 45-minute delay. Stress ECG: No significant change from baseline ECG  QPS Raw Data Images:  Normal; no motion artifact; normal heart/lung ratio. Stress Images:  There is decreased uptake in the anterior wall. Rest Images:  There is decreased uptake in the anterior wall. Subtraction (SDS):  There is a fixed defect that is most consistent with a previous infarction. Transient Ischemic Dilatation (Normal <1.22):  n/a Lung/Heart Ratio (Normal <0.45):  0.42  Quantitative Gated Spect Images QGS EDV:  107 ml QGS ESV:  48 ml  Impression Exercise Capacity:  Lexiscan with low level exercise. BP Response:  Normal blood pressure response. Clinical Symptoms:  No significant symptoms noted. ECG Impression:  No significant ST segment change suggestive of ischemia. Comparison with Prior Nuclear Study: No images to compare  Overall Impression:  Low risk stress nuclear study. There  is a medium size fixed defect of moderate severity involving the apex and apical anterior and midanterior segments.  This is consistent with his known prior anterior MI.  No reversible ischemia.  LV Ejection Fraction: 55%.  LV Wall Motion:  NL LV Function; NL Wall Motion   [employer]

## 2012-11-24 ENCOUNTER — Other Ambulatory Visit: Payer: Self-pay

## 2012-11-29 ENCOUNTER — Telehealth: Payer: Self-pay | Admitting: Cardiology

## 2012-11-29 MED ORDER — PANTOPRAZOLE SODIUM 40 MG PO TBEC: 40.0000 mg | DELAYED_RELEASE_TABLET | Freq: Every day | ORAL | Status: DC

## 2012-11-29 NOTE — Telephone Encounter (Signed)
NeW prob  Med prescribed in hospital. Its running low w/out refills ... Wife wants to know if they should renew it

## 2012-11-29 NOTE — Telephone Encounter (Signed)
Spoke with wife and will have him continue discharge medications until he sees  Dr. Patty Sermons 12/22/12, refilled Protonix.

## 2012-11-30 ENCOUNTER — Other Ambulatory Visit: Payer: Self-pay

## 2012-11-30 DIAGNOSIS — I119 Hypertensive heart disease without heart failure: Secondary | ICD-10-CM

## 2012-11-30 MED ORDER — AMLODIPINE BESYLATE 10 MG PO TABS: 10.0000 mg | ORAL_TABLET | Freq: Every day | ORAL | Status: DC

## 2012-12-14 ENCOUNTER — Other Ambulatory Visit: Payer: Self-pay

## 2012-12-14 DIAGNOSIS — I519 Heart disease, unspecified: Secondary | ICD-10-CM

## 2012-12-14 MED ORDER — CLOPIDOGREL BISULFATE 75 MG PO TABS: 75.0000 mg | ORAL_TABLET | Freq: Every day | ORAL | Status: DC

## 2012-12-16 ENCOUNTER — Other Ambulatory Visit (INDEPENDENT_AMBULATORY_CARE_PROVIDER_SITE_OTHER): Payer: Medicare Other

## 2012-12-16 DIAGNOSIS — I119 Hypertensive heart disease without heart failure: Secondary | ICD-10-CM

## 2012-12-16 DIAGNOSIS — E7800 Pure hypercholesterolemia, unspecified: Secondary | ICD-10-CM

## 2012-12-16 LAB — CBC WITH DIFFERENTIAL/PLATELET
Basophils Absolute: 0.1 10*3/uL (ref 0.0–0.1)
Basophils Relative: 1 % (ref 0.0–3.0)
Eosinophils Absolute: 0.3 10*3/uL (ref 0.0–0.7)
Eosinophils Relative: 6.2 % — ABNORMAL HIGH (ref 0.0–5.0)
HCT: 44.6 % (ref 39.0–52.0)
Hemoglobin: 15.1 g/dL (ref 13.0–17.0)
Lymphocytes Relative: 18.2 % (ref 12.0–46.0)
Lymphs Abs: 0.9 10*3/uL (ref 0.7–4.0)
MCHC: 33.9 g/dL (ref 30.0–36.0)
MCV: 88.9 fl (ref 78.0–100.0)
Monocytes Absolute: 0.7 10*3/uL (ref 0.1–1.0)
Monocytes Relative: 13.5 % — ABNORMAL HIGH (ref 3.0–12.0)
Neutro Abs: 3 10*3/uL (ref 1.4–7.7)
Neutrophils Relative %: 61.1 % (ref 43.0–77.0)
Platelets: 158 10*3/uL (ref 150.0–400.0)
RBC: 5.02 Mil/uL (ref 4.22–5.81)
RDW: 14.5 % (ref 11.5–14.6)
WBC: 5 10*3/uL (ref 4.5–10.5)

## 2012-12-17 LAB — HEPATIC FUNCTION PANEL
ALT: 19 U/L (ref 0–53)
AST: 17 U/L (ref 0–37)
Albumin: 3.8 g/dL (ref 3.5–5.2)
Alkaline Phosphatase: 50 U/L (ref 39–117)
Bilirubin, Direct: 0.1 mg/dL (ref 0.0–0.3)
Total Bilirubin: 0.7 mg/dL (ref 0.3–1.2)
Total Protein: 6.4 g/dL (ref 6.0–8.3)

## 2012-12-17 LAB — LIPID PANEL
Cholesterol: 119 mg/dL (ref 0–200)
HDL: 30.7 mg/dL — ABNORMAL LOW (ref >39.00)
LDL Cholesterol: 65 mg/dL (ref 0–99)
Total CHOL/HDL Ratio: 4
Triglycerides: 119 mg/dL (ref 0.0–149.0)
VLDL: 23.8 mg/dL (ref 0.0–40.0)

## 2012-12-17 LAB — BASIC METABOLIC PANEL
BUN: 22 mg/dL (ref 6–23)
CO2: 26 mEq/L (ref 19–32)
Calcium: 8.9 mg/dL (ref 8.4–10.5)
Chloride: 107 mEq/L (ref 96–112)
Creatinine, Ser: 0.9 mg/dL (ref 0.4–1.5)
GFR: 84.9 mL/min (ref >60.00)
Glucose, Bld: 95 mg/dL (ref 70–99)
Potassium: 4 mEq/L (ref 3.5–5.1)
Sodium: 137 mEq/L (ref 135–145)

## 2012-12-20 NOTE — Progress Notes (Signed)
Quick Note:  Please make copy of labs for patient visit. ______ 

## 2012-12-22 ENCOUNTER — Encounter: Payer: Self-pay | Admitting: Cardiology

## 2012-12-22 ENCOUNTER — Ambulatory Visit (INDEPENDENT_AMBULATORY_CARE_PROVIDER_SITE_OTHER): Payer: Medicare Other | Admitting: Cardiology

## 2012-12-22 VITALS — BP 120/74 | HR 53 | Ht 67.0 in | Wt 199.1 lb

## 2012-12-22 DIAGNOSIS — E785 Hyperlipidemia, unspecified: Secondary | ICD-10-CM

## 2012-12-22 DIAGNOSIS — E7800 Pure hypercholesterolemia, unspecified: Secondary | ICD-10-CM

## 2012-12-22 DIAGNOSIS — G4733 Obstructive sleep apnea (adult) (pediatric): Secondary | ICD-10-CM | POA: Insufficient documentation

## 2012-12-22 DIAGNOSIS — G4730 Sleep apnea, unspecified: Secondary | ICD-10-CM

## 2012-12-22 DIAGNOSIS — I259 Chronic ischemic heart disease, unspecified: Secondary | ICD-10-CM

## 2012-12-22 NOTE — Patient Instructions (Addendum)
Your physician recommends that you continue on your current medications as directed. Please refer to the Current Medication list given to you today.  Your physician wants you to follow-up in: 4 months with fasting labs (lp/bmet/hfp)  You will receive a reminder letter in the mail two months in advance. If you don't receive a letter, please call our office to schedule the follow-up appointment.   Your physician has recommended that you have a sleep study. This test records several body functions during sleep, including: brain activity, eye movement, oxygen and carbon dioxide blood levels, heart rate and rhythm, breathing rate and rhythm, the flow of air through your mouth and nose, snoring, body muscle movements, and chest and belly movement.

## 2012-12-22 NOTE — Assessment & Plan Note (Signed)
Patient remains on simvastatin for his dyslipidemia.  No myalgias.  Lab work is satisfactory.

## 2012-12-22 NOTE — Assessment & Plan Note (Signed)
The patient has a long history of snoring which has apparently become worse.  He wakes up frequently during the night and thinks that he wakes himself up with his breathing.  We will refer him to pulmonary for a sleep evaluation.  He may benefit from a CPAP machine

## 2012-12-22 NOTE — Progress Notes (Signed)
Douglas [name] Date of Birth:  1945/08/18 [facility] [address] [place], [place]  [address] [phone]         Fax   [phone]  History of Present Illness: This pleasant 67 year old gentleman is seen for a four-month followup office visit. He has a history of ischemic heart disease. 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 He has not had bypass surgery. A nuclear stress test in February 2011 showed no reversible ischemia. He does have a large scar involving the apex and the anterior distal and anteroseptal walls. His ejection fraction was 57%.  More recently the patient went to the emergency room in July 2014 he ruled out for a myocardial infarction.  He had a subsequent outpatient nuclear stress test 11/18/12 showing no evidence of ischemia and there was an old anterior scar and his ejection fraction was 55%.  Patient also has a past history of chronic GI bleeding managed with low-dose intermittent oral iron therapy. Recently his iron therapy was stopped but his hemoglobin has remained in normal range. He reminds me that the source of the GI bleeding was never actually pinpointed. he has a history of high blood pressure with elevated diastolics here in the office but blood pressure often normal at home. 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  Recently he has become more aware of bed snoring problem and waking himself up at night and is concerned that he may have sleep apnea.   Current Outpatient Prescriptions  Medication Sig Dispense Refill  . amLODipine (NORVASC) 10 MG tablet Take 1 tablet (10 mg total) by mouth daily.  30 tablet  5  . aspirin 81 MG EC tablet Take 81 mg by mouth daily.        . carvedilol (COREG) 25 MG tablet Take 25 mg by mouth 2 (two) times daily with a meal.        . Cholecalciferol (VITAMIN D) 2000 UNITS  tablet Take 2,000 Units by mouth daily.        . clopidogrel (PLAVIX) 75 MG tablet Take 1 tablet (75 mg total) by mouth daily.  30 tablet  11  . colchicine 0.6 MG tablet Take 0.6 mg by mouth daily as needed (gout).       . fexofenadine (ALLEGRA) 180 MG tablet Take 180 mg by mouth daily as needed (allergies).       . furosemide (LASIX) 20 MG tablet Take 20 mg by mouth daily as needed for fluid or edema.       . nitroGLYCERIN (NITROSTAT) 0.4 MG SL tablet Place 0.4 mg under the tongue every 5 (five) minutes as needed for chest pain.       . pantoprazole (PROTONIX) 40 MG tablet Take 1 tablet (40 mg total) by mouth daily at 6 (six) AM.  30 tablet  3  . simvastatin (ZOCOR) 20 MG tablet Take 20 mg by mouth at bedtime.        . valACYclovir (VALTREX) 1000 MG tablet Take 1,000 mg by mouth daily as needed (fever blisters).        No current facility-administered medications for this visit.    Allergies  Allergen Reactions  . Ace Inhibitors     Cough  . Lisinopril     cough  . Losartan     Cough  Patient Active Problem List   Diagnosis Date Noted  . Sleep apnea 12/22/2012  . Atypical chest pain 11/06/2012  . Hiatal hernia 11/06/2012  . Stented coronary artery 11/06/2012  . Ischemic heart disease 10/01/2010  . Benign hypertensive heart disease without heart failure 10/01/2010  . Dyslipidemia 10/01/2010  . 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 Former Smoker  . Quit date: 04/21/1968  Smokeless tobacco  . Former Neurosurgeon  . Quit date: 04/21/1978    History  Alcohol Use  . Yes    Comment: occasional    Family History  Problem Relation Age of Onset  . Heart disease Mother   . Heart disease Father   . Diabetes Mother   . Diabetes Father     Review of Systems: Constitutional: no fever chills diaphoresis or fatigue or change in weight.  Head and neck: no hearing loss, no epistaxis, no photophobia or visual  disturbance. Respiratory: No cough, shortness of breath or wheezing. Cardiovascular: No chest pain peripheral edema, palpitations. Gastrointestinal: No abdominal distention, no abdominal pain, no change in bowel habits hematochezia or melena. Genitourinary: No dysuria, no frequency, no urgency, no nocturia. Musculoskeletal:No arthralgias, no back pain, no gait disturbance or myalgias. Neurological: No dizziness, no headaches, no numbness, no seizures, no syncope, no weakness, no tremors. Hematologic: No lymphadenopathy, no easy bruising. Psychiatric: No confusion, no hallucinations, no sleep disturbance.    Physical Exam: Filed Vitals:   12/22/12 1052  BP: 120/74  Pulse: 53   general appearance reveals a well-developed well-nourished gentleman in no distress.  Weight is down 3 pounds since last visit.The head and neck exam reveals pupils equal and reactive.  Extraocular movements are full.  There is no scleral icterus.  The mouth and pharynx are normal.  The neck is supple.  The carotids reveal no bruits.  The jugular venous pressure is normal.  The  thyroid is not enlarged.  There is no lymphadenopathy.  The chest is clear to percussion and auscultation.  There are no rales or rhonchi.  Expansion of the chest is symmetrical.  The precordium is quiet.  The first heart sound is normal.  The second heart sound is physiologically split.  There is no murmur gallop rub or click.  There is no abnormal lift or heave.  The abdomen is soft and nontender.  The bowel sounds are normal.  The liver and spleen are not enlarged.  There are no abdominal masses.  There are no abdominal bruits.  Extremities reveal good pedal pulses.  There is no phlebitis or edema.  There is no cyanosis or clubbing.  Strength is normal and symmetrical in all extremities.  There is no lateralizing weakness.  There are no sensory deficits.  The skin is warm and dry.  There is no rash.     Assessment / Plan:  Continue same  medication.  Refer to pulmonary for sleep study.  Recheck in 4 months for office visit CBC and fasting lipid panel hepatic function panel and basal metabolic panel.

## 2012-12-22 NOTE — Assessment & Plan Note (Signed)
The patient has had no further chest discomfort.

## 2012-12-24 ENCOUNTER — Encounter: Payer: Medicare Other | Admitting: Cardiology

## 2013-01-18 ENCOUNTER — Ambulatory Visit (HOSPITAL_BASED_OUTPATIENT_CLINIC_OR_DEPARTMENT_OTHER): Payer: Medicare Other | Attending: Cardiology | Admitting: Radiology

## 2013-01-18 VITALS — Ht 67.0 in | Wt 195.0 lb

## 2013-01-18 DIAGNOSIS — G4733 Obstructive sleep apnea (adult) (pediatric): Secondary | ICD-10-CM | POA: Insufficient documentation

## 2013-01-18 DIAGNOSIS — G4730 Sleep apnea, unspecified: Secondary | ICD-10-CM

## 2013-01-27 DIAGNOSIS — G4730 Sleep apnea, unspecified: Secondary | ICD-10-CM

## 2013-01-27 DIAGNOSIS — G4733 Obstructive sleep apnea (adult) (pediatric): Secondary | ICD-10-CM

## 2013-01-28 NOTE — Procedures (Signed)
NAME:  Douglas, [name] NO.:  [id number]  MEDICAL RECORD NO.:  [id number]          PATIENT TYPE:  OUT  LOCATION:  SLEEP CENTER                 FACILITY[facility]  [facility]  PHYSICIAN:  Barbaraann Share, MD,FCCPDATE OF BIRTH:  Oct 15, 1945  DATE OF STUDY:  01/18/2013                           NOCTURNAL POLYSOMNOGRAM  REFERRING PHYSICIAN:  Cassell Clement, M.D.  INDICATION FOR STUDY:  Hypersomnia with sleep apnea.  EPWORTH SLEEPINESS SCORE:  Twelve.  MEDICATIONS:  SLEEP ARCHITECTURE:  The patient had a total sleep time of 243 minutes with no slow-wave sleep and only 31 minute of REM.  Sleep onset latency was 26 minutes and therefore normal, and REM onset was delayed at 147 minutes.  Sleep efficiency was poor at 66%.  RESPIRATORY DATA:  The patient was found to have 4 apneas and 85 obstructive hypopneas, giving him an apnea-hypopnea index of 22 events per hour.  The events occurred in all body positions, and there was moderate to loud snoring noted throughout.  OXYGEN DATA:  There was O2 desaturation as low as 82% with the patient's obstructive events.  CARDIAC DATA:  Occasional PAC and PVC noted, but no clinically significant arrhythmias were seen.  MOVEMENT-PARASOMNIA:  The patient had no significant leg jerks or other abnormal behaviors noted.  IMPRESSIONS-RECOMMENDATIONS: 1. Moderate obstructive sleep apnea/hypopnea syndrome, with an AHI of     22 events per hour and oxygen desaturation as low as 82%.     Treatment for this degree of sleep apnea can include a trial of     weight loss alone, upper airway surgery, dental appliance, and also     CPAP.  Clinical correlation is suggested. 2. Occasional premature atrial contractions and premature ventricular     contraction noted, but no clinically significant arrhythmias were     seen.    Barbaraann Share, MD,FCCP Diplomate, American Board of Sleep Medicine   [facility]  D:  01/27/2013 08:51:56  T:  01/28/2013  02:01:00  Job:  [id number]

## 2013-02-01 ENCOUNTER — Encounter: Payer: Self-pay | Admitting: Cardiology

## 2013-02-04 ENCOUNTER — Telehealth: Payer: Self-pay | Admitting: *Deleted

## 2013-02-04 DIAGNOSIS — G4730 Sleep apnea, unspecified: Secondary | ICD-10-CM

## 2013-02-04 NOTE — Telephone Encounter (Signed)
Left message to call back   Sleep study reviewed by  Dr. Patty Sermons, needs consult with pulmonary for sleep apnea

## 2013-02-07 NOTE — Telephone Encounter (Signed)
Follow Up ° °Pt returning call.  °

## 2013-02-07 NOTE — Telephone Encounter (Signed)
Schedule ov with Dr Maple Hudson for 02/15/13, advised wife

## 2013-02-15 ENCOUNTER — Ambulatory Visit (INDEPENDENT_AMBULATORY_CARE_PROVIDER_SITE_OTHER): Payer: Medicare Other | Admitting: Internal Medicine

## 2013-02-15 ENCOUNTER — Encounter: Payer: Self-pay | Admitting: Internal Medicine

## 2013-02-15 VITALS — BP 114/64 | HR 66 | Ht 67.0 in | Wt 203.6 lb

## 2013-02-15 DIAGNOSIS — G4733 Obstructive sleep apnea (adult) (pediatric): Secondary | ICD-10-CM

## 2013-02-15 DIAGNOSIS — G4710 Hypersomnia, unspecified: Secondary | ICD-10-CM

## 2013-02-15 NOTE — Patient Instructions (Signed)
Order- DME  New CPAP autotitration 5- 20 cwp x 7 days for pressure recommendation, mask of choice, humidifier, supplies    Dx OSA

## 2013-02-15 NOTE — Progress Notes (Signed)
02/15/13- 57 yoM former smoker referred courtesy of Dr Tollie Pizza study attached Wife here Chronic complaints of daytime sleepiness loud snoring and witnessed apneas. He denies problems driving and uses little caffeine. Bedtime 10 and 11 PM, sleep latency 10 minutes, waking 2-3 times for bathroom before up between 6 and 7 AM. ENT surgery limited to tonsils. Medical history of myocardial infarction, hypertension without lung disease. NPSG 01/18/13 AHI  22/hr, moderate OSA, weight  195 lbs  Prior to Admission medications   Medication Sig Start Date End Date Taking? Authorizing Provider  amLODipine (NORVASC) 10 MG tablet Take 1 tablet (10 mg total) by mouth daily. 11/30/12  Yes Cassell Clement, MD  aspirin 81 MG EC tablet Take 81 mg by mouth daily.     Yes Historical Provider, MD  carvedilol (COREG) 25 MG tablet Take 25 mg by mouth 2 (two) times daily with a meal.     Yes Historical Provider, MD  Cholecalciferol (VITAMIN D) 2000 UNITS tablet Take 2,000 Units by mouth daily.     Yes Historical Provider, MD  clopidogrel (PLAVIX) 75 MG tablet Take 1 tablet (75 mg total) by mouth daily. 12/14/12  Yes Cassell Clement, MD  colchicine 0.6 MG tablet Take 0.6 mg by mouth daily as needed (gout).    Yes Historical Provider, MD  fexofenadine (ALLEGRA) 180 MG tablet Take 180 mg by mouth daily as needed (allergies).    Yes Historical Provider, MD  furosemide (LASIX) 20 MG tablet Take 20 mg by mouth daily as needed for fluid or edema.  10/14/10  Yes Cassell Clement, MD  nitroGLYCERIN (NITROSTAT) 0.4 MG SL tablet Place 0.4 mg under the tongue every 5 (five) minutes as needed for chest pain.    Yes Historical Provider, MD  pantoprazole (PROTONIX) 40 MG tablet Take 1 tablet (40 mg total) by mouth daily at 6 (six) AM. 11/29/12  Yes Cassell Clement, MD  simvastatin (ZOCOR) 20 MG tablet Take 20 mg by mouth at bedtime.     Yes Historical Provider, MD  valACYclovir (VALTREX) 1000 MG tablet Take 1,000 mg by mouth daily  as needed (fever blisters).    Yes Historical Provider, MD   Past Medical History  Diagnosis Date  . Heart disease     ischemic  . Heart attack 10/29/05    anteroseptal myocardial infaction Taxus stent to LAD  . Hypertension   . Hyperlipidemia   . H/O hiatal hernia   . Arthritis   . Anemia    Past Surgical History  Procedure Laterality Date  . Cardiovascular stress test Left 05/28/2009    EF 57% no reversible ischemia  . Doppler echocardiography  01/06/2007  . Eye surgery    . Shoulder arthroscopy w/ rotator cuff repair    . Cardiac catheterization    . Coronary angioplasty with stent placement Bilateral 2007   Family History  Problem Relation Age of Onset  . Heart disease Mother   . Heart disease Father   . Diabetes Mother   . Diabetes Father    History   Social History  . Marital Status: Married    Spouse Name: N/A    Number of Children: N/A  . Years of Education: N/A   Occupational History  . Not on file.   Social History Main Topics  . Smoking status: Former Smoker    Quit date: 04/21/1968  . Smokeless tobacco: Former Neurosurgeon    Quit date: 04/21/1978  . Alcohol Use: Yes     Comment: occasional  .  Drug Use: No  . Sexual Activity: Yes   Other Topics Concern  . Not on file   Social History Narrative  . No narrative on file   ROS-see HPI Constitutional:   No-   weight loss, night sweats, fevers, chills, +fatigue, lassitude. HEENT:   No-  headaches, difficulty swallowing, tooth/dental problems, sore throat,       No-  sneezing, itching, ear ache, nasal congestion, post nasal drip,  CV:  No-   chest pain, orthopnea, PND, swelling in lower extremities, anasarca, dizziness, palpitations Resp: No-   shortness of breath with exertion or at rest.              No-   productive cough,  No non-productive cough,  No- coughing up of blood.              No-   change in color of mucus.  No- wheezing.   Skin: No-   rash or lesions. GI:  No-   heartburn, indigestion,  abdominal pain, nausea, vomiting,  GU: No-   dysuria, change in color of urine, no urgency or frequency.  No- flank pain. MS:  No-   joint pain or swelling.  No- decreased range of motion.  No- back pain. Neuro-     nothing unusual Psych:  No- change in mood or affect. No depression or anxiety.  No memory loss.  OBJ- Physical Exam General- Alert, Oriented, Affect-appropriate, Distress- none acute. Overweight Skin- rash-none, lesions- none, excoriation- none Lymphadenopathy- none Head- atraumatic            Eyes- Gross vision intact, PERRLA, conjunctivae and secretions clear            Ears- Hearing, canals-normal            Nose- Clear, no-Septal dev, mucus, polyps, erosion, perforation             Throat- Mallampati II , mucosa clear , drainage- none, tonsils- atrophic Neck- flexible , trachea midline, no stridor , thyroid nl, carotid no bruit Chest - symmetrical excursion , unlabored           Heart/CV- RRR , no murmur , no gallop  , no rub, nl s1 s2                           - JVD- none , edema- none, stasis changes- none, varices- none           Lung- clear to P&A, wheeze- none, cough- none , dullness-none, rub- none           Chest wall-  Abd- tender-no, distended-no, bowel sounds-present, HSM- no Br/ Gen/ Rectal- Not done, not indicated Extrem- cyanosis- none, clubbing, none, atrophy- none, strength- nl Neuro- grossly intact to observation

## 2013-02-28 ENCOUNTER — Encounter: Payer: Self-pay | Admitting: Internal Medicine

## 2013-02-28 NOTE — Assessment & Plan Note (Signed)
Moderate obstructive sleep apnea. Weight loss, driving responsibility and ofthe medical problems and available treatments were reviewed Plan-CPAP, starting with auto titration for pressure recommendation.

## 2013-03-29 ENCOUNTER — Encounter: Payer: Self-pay | Admitting: Cardiology

## 2013-04-04 ENCOUNTER — Other Ambulatory Visit: Payer: Self-pay

## 2013-04-04 MED ORDER — PANTOPRAZOLE SODIUM 40 MG PO TBEC: 40.0000 mg | DELAYED_RELEASE_TABLET | Freq: Every day | ORAL | Status: DC

## 2013-04-18 ENCOUNTER — Ambulatory Visit (INDEPENDENT_AMBULATORY_CARE_PROVIDER_SITE_OTHER): Payer: Medicare Other | Admitting: Internal Medicine

## 2013-04-18 ENCOUNTER — Encounter: Payer: Self-pay | Admitting: Internal Medicine

## 2013-04-18 VITALS — BP 110/72 | HR 56 | Ht 67.0 in | Wt 203.0 lb

## 2013-04-18 DIAGNOSIS — G4733 Obstructive sleep apnea (adult) (pediatric): Secondary | ICD-10-CM

## 2013-04-18 NOTE — Progress Notes (Signed)
02/15/13- 51 yoM former smoker referred courtesy of Dr Tollie Pizza study attached Wife here Chronic complaints of daytime sleepiness loud snoring and witnessed apneas. He denies problems driving and uses little caffeine. Bedtime 10 and 11 PM, sleep latency 10 minutes, waking 2-3 times for bathroom before up between 6 and 7 AM. ENT surgery limited to tonsils. Medical history of myocardial infarction, hypertension without lung disease. NPSG 01/18/13 AHI  22/hr, moderate OSA, weight  195 lbs  04/18/13- 67 yoM former smoker followed for OSA, complicated by CAD, HBP Wearing CPAP Auto/ [facility] for 8 hours per night.  Discuss new face mask Download showed inadequate use, but patient and her husband say the CPAP machine modem signal is poor and often interrupted. There is also very poor cell phone resection at their home. He assures me he is using CPAP. Wife confirms he uses it. It stops his snoring.  ROS-see HPI Constitutional:   No-   weight loss, night sweats, fevers, chills, fatigue, lassitude. HEENT:   No-  headaches, difficulty swallowing, tooth/dental problems, sore throat,       No-  sneezing, itching, ear ache, nasal congestion, post nasal drip,  CV:  No-   chest pain, orthopnea, PND, swelling in lower extremities, anasarca, dizziness, palpitations Resp: No-   shortness of breath with exertion or at rest.              No-   productive cough,  No non-productive cough,  No- coughing up of blood.              No-   change in color of mucus.  No- wheezing.   Skin: No-   rash or lesions. GI:  No-   heartburn, indigestion, abdominal pain, nausea, vomiting,  GU:  MS:  No-   joint pain or swelling.   Neuro-     nothing unusual Psych:  No- change in mood or affect. No depression or anxiety.  No memory loss.  OBJ- Physical Exam General- Alert, Oriented, Affect-appropriate, Distress- none acute. Overweight Skin- rash-none, lesions- none, excoriation- none Lymphadenopathy- none Head-  atraumatic            Eyes- Gross vision intact, PERRLA, conjunctivae and secretions clear            Ears- Hearing, canals-normal            Nose- Clear, no-Septal dev, mucus, polyps, erosion, perforation             Throat- Mallampati II , mucosa clear , drainage- none, tonsils- atrophic Neck- flexible , trachea midline, no stridor , thyroid nl, carotid no bruit Chest - symmetrical excursion , unlabored           Heart/CV- RRR , no murmur , no gallop  , no rub, nl s1 s2                           - JVD- none , edema- none, stasis changes- none, varices- none           Lung- clear to P&A, wheeze- none, cough- none , dullness-none, rub- none           Chest wall-  Abd-  Br/ Gen/ Rectal- Not done, not indicated Extrem- cyanosis- none, clubbing, none, atrophy- none, strength- nl Neuro- grossly intact to observation

## 2013-04-18 NOTE — Patient Instructions (Signed)
Order- DME [facility] change CPAP to 12 fixed  Ok to try adjusting humidity to what seems comfortable for you.

## 2013-04-25 ENCOUNTER — Other Ambulatory Visit (INDEPENDENT_AMBULATORY_CARE_PROVIDER_SITE_OTHER): Payer: Medicare Other

## 2013-04-25 DIAGNOSIS — G4730 Sleep apnea, unspecified: Secondary | ICD-10-CM

## 2013-04-25 DIAGNOSIS — E7800 Pure hypercholesterolemia, unspecified: Secondary | ICD-10-CM

## 2013-04-25 LAB — CBC WITH DIFFERENTIAL/PLATELET
Basophils Absolute: 0 10*3/uL (ref 0.0–0.1)
Basophils Relative: 0.3 % (ref 0.0–3.0)
Eosinophils Absolute: 0.3 10*3/uL (ref 0.0–0.7)
Eosinophils Relative: 4.8 % (ref 0.0–5.0)
HCT: 44.9 % (ref 39.0–52.0)
Hemoglobin: 15 g/dL (ref 13.0–17.0)
Lymphocytes Relative: 16.7 % (ref 12.0–46.0)
Lymphs Abs: 0.9 10*3/uL (ref 0.7–4.0)
MCHC: 33.4 g/dL (ref 30.0–36.0)
MCV: 88.6 fl (ref 78.0–100.0)
Monocytes Absolute: 0.6 10*3/uL (ref 0.1–1.0)
Monocytes Relative: 11.3 % (ref 3.0–12.0)
Neutro Abs: 3.8 10*3/uL (ref 1.4–7.7)
Neutrophils Relative %: 66.9 % (ref 43.0–77.0)
Platelets: 183 10*3/uL (ref 150.0–400.0)
RBC: 5.07 Mil/uL (ref 4.22–5.81)
RDW: 14.4 % (ref 11.5–14.6)
WBC: 5.7 10*3/uL (ref 4.5–10.5)

## 2013-04-25 LAB — LIPID PANEL
Cholesterol: 132 mg/dL (ref 0–200)
HDL: 29.8 mg/dL — ABNORMAL LOW (ref >39.00)
LDL Cholesterol: 78 mg/dL (ref 0–99)
Total CHOL/HDL Ratio: 4
Triglycerides: 123 mg/dL (ref 0.0–149.0)
VLDL: 24.6 mg/dL (ref 0.0–40.0)

## 2013-04-25 LAB — HEPATIC FUNCTION PANEL
ALT: 16 U/L (ref 0–53)
AST: 14 U/L (ref 0–37)
Albumin: 3.9 g/dL (ref 3.5–5.2)
Alkaline Phosphatase: 58 U/L (ref 39–117)
Bilirubin, Direct: 0.1 mg/dL (ref 0.0–0.3)
Total Bilirubin: 0.8 mg/dL (ref 0.3–1.2)
Total Protein: 6.6 g/dL (ref 6.0–8.3)

## 2013-04-25 LAB — BASIC METABOLIC PANEL
BUN: 17 mg/dL (ref 6–23)
CO2: 27 mEq/L (ref 19–32)
Calcium: 9.1 mg/dL (ref 8.4–10.5)
Chloride: 104 mEq/L (ref 96–112)
Creatinine, Ser: 1 mg/dL (ref 0.4–1.5)
GFR: 80.83 mL/min (ref >60.00)
Glucose, Bld: 106 mg/dL — ABNORMAL HIGH (ref 70–99)
Potassium: 4.2 mEq/L (ref 3.5–5.1)
Sodium: 137 mEq/L (ref 135–145)

## 2013-04-25 NOTE — Progress Notes (Signed)
Quick Note:  Please make copy of labs for patient visit. ______ 

## 2013-05-03 ENCOUNTER — Encounter: Payer: Self-pay | Admitting: Cardiology

## 2013-05-03 ENCOUNTER — Ambulatory Visit (INDEPENDENT_AMBULATORY_CARE_PROVIDER_SITE_OTHER): Payer: Medicare Other | Admitting: Cardiology

## 2013-05-03 VITALS — BP 116/70 | HR 60 | Ht 67.0 in | Wt 200.0 lb

## 2013-05-03 DIAGNOSIS — I259 Chronic ischemic heart disease, unspecified: Secondary | ICD-10-CM

## 2013-05-03 DIAGNOSIS — D500 Iron deficiency anemia secondary to blood loss (chronic): Secondary | ICD-10-CM

## 2013-05-03 DIAGNOSIS — N401 Enlarged prostate with lower urinary tract symptoms: Secondary | ICD-10-CM

## 2013-05-03 DIAGNOSIS — G4733 Obstructive sleep apnea (adult) (pediatric): Secondary | ICD-10-CM

## 2013-05-03 DIAGNOSIS — R351 Nocturia: Secondary | ICD-10-CM

## 2013-05-03 DIAGNOSIS — E7800 Pure hypercholesterolemia, unspecified: Secondary | ICD-10-CM

## 2013-05-03 DIAGNOSIS — G4730 Sleep apnea, unspecified: Secondary | ICD-10-CM

## 2013-05-03 DIAGNOSIS — I119 Hypertensive heart disease without heart failure: Secondary | ICD-10-CM

## 2013-05-03 NOTE — Progress Notes (Signed)
Douglas [name] Date of Birth:  1945-07-30 [address] [place], [place]  [address] [phone]         Fax   [phone]  History of Present Illness: This pleasant 68 year old gentleman is seen for a four-month followup office visit. He has a history of ischemic heart disease. 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 He has not had bypass surgery. A nuclear stress test in February 2011 showed no reversible ischemia. He does have a large scar involving the apex and the anterior distal and anteroseptal walls. His ejection fraction was 57%.  More recently the patient went to the emergency room in July 2014 he ruled out for a myocardial infarction.  He had a subsequent outpatient nuclear stress test 11/18/12 showing no evidence of ischemia and there was an old anterior scar and his ejection fraction was 55%.  Patient also has a past history of chronic GI bleeding managed with low-dose intermittent oral iron therapy. Recently his iron therapy was stopped but his hemoglobin has remained in normal range. He reminds me that the source of the GI bleeding was never actually pinpointed. he has a history of high blood pressure with elevated diastolics here in the office but blood pressure often normal at home. 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  Since last visit he has seen Dr. Baird Lyons and has been treated for sleep apnea with CPAP machine.   Current Outpatient Prescriptions  Medication Sig Dispense Refill  . amLODipine (NORVASC) 10 MG tablet Take 1 tablet (10 mg total) by mouth daily.  30 tablet  5  . aspirin 81 MG EC tablet Take 81 mg by mouth daily.        . carvedilol (COREG) 25 MG tablet Take 25 mg by mouth 2 (two) times daily with a meal.        . Cholecalciferol (VITAMIN D) 2000 UNITS tablet Take 2,000 Units by mouth daily.          . clopidogrel (PLAVIX) 75 MG tablet Take 1 tablet (75 mg total) by mouth daily.  30 tablet  11  . colchicine 0.6 MG tablet Take 0.6 mg by mouth daily as needed (gout).       . fexofenadine (ALLEGRA) 180 MG tablet Take 180 mg by mouth daily as needed (allergies).       . furosemide (LASIX) 20 MG tablet Take 20 mg by mouth daily as needed for fluid or edema.       . nitroGLYCERIN (NITROSTAT) 0.4 MG SL tablet Place 0.4 mg under the tongue every 5 (five) minutes as needed for chest pain.       . pantoprazole (PROTONIX) 40 MG tablet Take 1 tablet (40 mg total) by mouth daily at 6 (six) AM.  30 tablet  3  . simvastatin (ZOCOR) 20 MG tablet Take 20 mg by mouth at bedtime.        . valACYclovir (VALTREX) 1000 MG tablet Take 1,000 mg by mouth daily as needed (fever blisters).        No current facility-administered medications for this visit.    Allergies  Allergen Reactions  . Ace Inhibitors     Cough  . Lisinopril     cough  . Losartan     Cough    Patient Active Problem  List   Diagnosis Date Noted  . Obstructive sleep apnea 12/22/2012  . Atypical chest pain 11/06/2012  . Hiatal hernia 11/06/2012  . Stented coronary artery 11/06/2012  . Ischemic heart disease 10/01/2010  . Benign hypertensive heart disease without heart failure 10/01/2010  . Dyslipidemia 10/01/2010  . 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 Former Smoker -- 5.00 packs/day for 10 years  . Types: Cigarettes  . Quit date: 04/21/1968  Smokeless tobacco  . Former Systems developer  . Quit date: 04/21/1978    History  Alcohol Use  . Yes    Comment: occasional    Family History  Problem Relation Age of Onset  . Heart disease Mother   . Diabetes Mother   . Heart disease Father   . Diabetes Father     Review of Systems: Constitutional: no fever chills diaphoresis or fatigue or change in weight.  Head and neck: no hearing loss, no epistaxis, no photophobia or  visual disturbance. Respiratory: No cough, shortness of breath or wheezing. Cardiovascular: No chest pain peripheral edema, palpitations. Gastrointestinal: No abdominal distention, no abdominal pain, no change in bowel habits hematochezia or melena. Genitourinary: No dysuria, no frequency, no urgency, no nocturia. Musculoskeletal:No arthralgias, no back pain, no gait disturbance or myalgias. Neurological: No dizziness, no headaches, no numbness, no seizures, no syncope, no weakness, no tremors. Hematologic: No lymphadenopathy, no easy bruising. Psychiatric: No confusion, no hallucinations, no sleep disturbance.    Physical Exam: Filed Vitals:   05/03/13 0952  BP: 116/70  Pulse: 60   general appearance reveals a well-developed well-nourished gentleman in no distress.  Weight is down 3 pounds since last visit.The head and neck exam reveals pupils equal and reactive.  Extraocular movements are full.  There is no scleral icterus.  The mouth and pharynx are normal.  The neck is supple.  The carotids reveal no bruits.  The jugular venous pressure is normal.  The  thyroid is not enlarged.  There is no lymphadenopathy.  The chest is clear to percussion and auscultation.  There are no rales or rhonchi.  Expansion of the chest is symmetrical.  The precordium is quiet.  The first heart sound is normal.  The second heart sound is physiologically split.  There is no murmur gallop rub or click.  There is no abnormal lift or heave.  The abdomen is soft and nontender.  The bowel sounds are normal.  The liver and spleen are not enlarged.  There are no abdominal masses.  There are no abdominal bruits.  Extremities reveal good pedal pulses.  There is no phlebitis or edema.  There is no cyanosis or clubbing.  Strength is normal and symmetrical in all extremities.  There is no lateralizing weakness.  There are no sensory deficits.  The skin is warm and dry.  There is no rash.     Assessment / Plan:  The patient  is to continue same medication.  He needs to work harder on weight loss.  We reviewed his lab work today which is not quite as good as last time.  He will return in 4 months for office visit EKG and fasting lab work and we will also check a PSA and a CBC

## 2013-05-03 NOTE — Assessment & Plan Note (Signed)
The patient has not been experiencing any recurrent chest pain or angina. 

## 2013-05-03 NOTE — Assessment & Plan Note (Signed)
The patient has felt much better overall since starting CPAP.  Interestingly using the CPAP machine has also cured his previous problem of nocturia.  Instead of getting up 3 or 4 times at night he gets up 0-1 time per night.

## 2013-05-03 NOTE — Assessment & Plan Note (Signed)
His blood pressure control has improved since starting CPAP.

## 2013-05-03 NOTE — Patient Instructions (Signed)
Your physician recommends that you continue on your current medications as directed. Please refer to the Current Medication list given to you today.  Your physician wants you to follow-up in: 4 months with fasting labs (lp/bmet/hfp/CBC/EKG) You will receive a reminder letter in the mail two months in advance. If you don't receive a letter, please call our office to schedule the follow-up appointment.

## 2013-05-08 NOTE — Assessment & Plan Note (Signed)
His wife confirms he is wearing CPAP all night every night and that it stops his snoring. We discussed humidifier. Plan-DME/[facility] change CPAP to a fixed pressure 12

## 2013-06-03 ENCOUNTER — Other Ambulatory Visit: Payer: Self-pay

## 2013-06-03 DIAGNOSIS — I119 Hypertensive heart disease without heart failure: Secondary | ICD-10-CM

## 2013-06-03 MED ORDER — AMLODIPINE BESYLATE 10 MG PO TABS: 10.0000 mg | ORAL_TABLET | Freq: Every day | ORAL | Status: DC

## 2013-07-13 ENCOUNTER — Telehealth: Payer: Self-pay | Admitting: Cardiology

## 2013-07-13 NOTE — Telephone Encounter (Signed)
Received request from Nurse fax box, documents faxed for surgical clearance. To: [facility] Fax number: [phone] Attention: [phone]/[phone]

## 2013-07-29 ENCOUNTER — Other Ambulatory Visit: Payer: Self-pay | Admitting: Gastroenterology

## 2013-08-02 ENCOUNTER — Other Ambulatory Visit: Payer: Self-pay | Admitting: *Deleted

## 2013-08-02 MED ORDER — PANTOPRAZOLE SODIUM 40 MG PO TBEC
40.0000 mg | DELAYED_RELEASE_TABLET | Freq: Every day | ORAL | Status: DC
Start: 2013-08-02 — End: 2013-09-02

## 2013-08-17 ENCOUNTER — Ambulatory Visit (INDEPENDENT_AMBULATORY_CARE_PROVIDER_SITE_OTHER): Payer: Medicare Other | Admitting: Internal Medicine

## 2013-08-17 ENCOUNTER — Encounter: Payer: Self-pay | Admitting: Internal Medicine

## 2013-08-17 VITALS — BP 114/76 | HR 56 | Ht 67.0 in | Wt 204.8 lb

## 2013-08-17 DIAGNOSIS — G4733 Obstructive sleep apnea (adult) (pediatric): Secondary | ICD-10-CM

## 2013-08-17 DIAGNOSIS — I119 Hypertensive heart disease without heart failure: Secondary | ICD-10-CM

## 2013-08-17 NOTE — Assessment & Plan Note (Signed)
Good compliance and control at 12 / [facility]

## 2013-08-17 NOTE — Patient Instructions (Signed)
We can continue CPAP 12/ [facility]  Please call ifwe can help 

## 2013-08-17 NOTE — Progress Notes (Signed)
02/15/13- 63 yoM former smoker referred courtesy of Dr Sharyon Medicus study attached Wife here Chronic complaints of daytime sleepiness loud snoring and witnessed apneas. He denies problems driving and uses little caffeine. Bedtime 10 and 11 PM, sleep latency 10 minutes, waking 2-3 times for bathroom before up between 6 and 7 AM. ENT surgery limited to tonsils. Medical history of myocardial infarction, hypertension without lung disease. NPSG 01/18/13 AHI  22/hr, moderate OSA, weight  195 lbs  04/18/13- 67 yoM former smoker followed for OSA, complicated by CAD, HBP Wearing CPAP Auto/ [facility] for 8 hours per night.  Discuss new face mask Download showed inadequate use, but patient and her husband say the CPAP machine modem signal is poor and often interrupted. There is also very poor cell phone reception at their home. He assures me he is using CPAP. Wife confirms he uses it. It stops his snoring.  08/17/13- 67 yoM former smoker followed for OSA, complicated by CAD, HBP CPAP 12/ [facility] all night every night Download confirms excellent compliance.  He is "used to CPAP now" and comfortable. Wife confirms.  ROS-see HPI Constitutional:   No-   weight loss, night sweats, fevers, chills, fatigue, lassitude. HEENT:   No-  headaches, difficulty swallowing, tooth/dental problems, sore throat,       No-  sneezing, itching, ear ache, nasal congestion, post nasal drip,  CV:  No-   chest pain, orthopnea, PND, swelling in lower extremities, anasarca, dizziness, palpitations Resp: No-   shortness of breath with exertion or at rest.              No-   productive cough,  No non-productive cough,  No- coughing up of blood.              No-   change in color of mucus.  No- wheezing.   Skin: No-   rash or lesions. GI:  No-   heartburn, indigestion, abdominal pain, nausea, vomiting,  GU:  MS:  No-   joint pain or swelling.   Neuro-     nothing unusual Psych:  No- change in mood or affect. No depression or anxiety.   No memory loss.  OBJ- Physical Exam General- Alert, Oriented, Affect-appropriate, Distress- none acute. Overweight Skin- rash-none, lesions- none, excoriation- none Lymphadenopathy- none Head- atraumatic            Eyes- Gross vision intact, PERRLA, conjunctivae and secretions clear            Ears- Hearing, canals-normal            Nose- Clear, no-Septal dev, mucus, polyps, erosion, perforation             Throat- Mallampati II-III , mucosa clear , drainage- none, tonsils- atrophic Neck- flexible , trachea midline, no stridor , thyroid nl, carotid no bruit Chest - symmetrical excursion , unlabored           Heart/CV- RRR , no murmur , no gallop  , no rub, nl s1 s2                           - JVD- none , edema- none, stasis changes- none, varices- none           Lung- clear to P&A, wheeze- none, cough- none , dullness-none, rub- none           Chest wall-  Abd-  Br/ Gen/ Rectal- Not done, not indicated Extrem- cyanosis- none, clubbing, none,  atrophy- none, strength- nl Neuro- grossly intact to observation

## 2013-08-17 NOTE — Assessment & Plan Note (Signed)
He says cardiology is satisfied, with no recent concerns.

## 2013-09-02 ENCOUNTER — Other Ambulatory Visit: Payer: Self-pay

## 2013-09-02 MED ORDER — PANTOPRAZOLE SODIUM 40 MG PO TBEC: 40.0000 mg | DELAYED_RELEASE_TABLET | Freq: Every day | ORAL | Status: DC

## 2013-09-09 ENCOUNTER — Encounter: Payer: Self-pay | Admitting: Cardiology

## 2013-10-04 ENCOUNTER — Other Ambulatory Visit: Payer: Self-pay | Admitting: *Deleted

## 2013-10-04 MED ORDER — PANTOPRAZOLE SODIUM 40 MG PO TBEC: 40.0000 mg | DELAYED_RELEASE_TABLET | Freq: Every day | ORAL | Status: DC

## 2013-10-24 ENCOUNTER — Other Ambulatory Visit (INDEPENDENT_AMBULATORY_CARE_PROVIDER_SITE_OTHER): Payer: Medicare Other

## 2013-10-24 DIAGNOSIS — E7800 Pure hypercholesterolemia, unspecified: Secondary | ICD-10-CM

## 2013-10-24 DIAGNOSIS — R351 Nocturia: Secondary | ICD-10-CM

## 2013-10-24 DIAGNOSIS — D500 Iron deficiency anemia secondary to blood loss (chronic): Secondary | ICD-10-CM

## 2013-10-24 DIAGNOSIS — N138 Other obstructive and reflux uropathy: Secondary | ICD-10-CM

## 2013-10-24 DIAGNOSIS — N401 Enlarged prostate with lower urinary tract symptoms: Secondary | ICD-10-CM

## 2013-10-24 LAB — HEPATIC FUNCTION PANEL
ALT: 16 U/L (ref 0–53)
AST: 19 U/L (ref 0–37)
Albumin: 3.5 g/dL (ref 3.5–5.2)
Alkaline Phosphatase: 56 U/L (ref 39–117)
Bilirubin, Direct: 0.1 mg/dL (ref 0.0–0.3)
Total Bilirubin: 0.6 mg/dL (ref 0.2–1.2)
Total Protein: 5.9 g/dL — ABNORMAL LOW (ref 6.0–8.3)

## 2013-10-24 LAB — LIPID PANEL
Cholesterol: 120 mg/dL (ref 0–200)
HDL: 30.9 mg/dL — ABNORMAL LOW (ref >39.00)
LDL Cholesterol: 69 mg/dL (ref 0–99)
NonHDL: 89.1
Total CHOL/HDL Ratio: 4
Triglycerides: 99 mg/dL (ref 0.0–149.0)
VLDL: 19.8 mg/dL (ref 0.0–40.0)

## 2013-10-24 LAB — BASIC METABOLIC PANEL
BUN: 18 mg/dL (ref 6–23)
CO2: 24 mEq/L (ref 19–32)
Calcium: 8.9 mg/dL (ref 8.4–10.5)
Chloride: 106 mEq/L (ref 96–112)
Creatinine, Ser: 1 mg/dL (ref 0.4–1.5)
GFR: 80.71 mL/min (ref >60.00)
Glucose, Bld: 113 mg/dL — ABNORMAL HIGH (ref 70–99)
Potassium: 4.1 mEq/L (ref 3.5–5.1)
Sodium: 138 mEq/L (ref 135–145)

## 2013-10-24 LAB — CBC WITH DIFFERENTIAL/PLATELET
Basophils Absolute: 0 10*3/uL (ref 0.0–0.1)
Basophils Relative: 0.5 % (ref 0.0–3.0)
Eosinophils Absolute: 0.3 10*3/uL (ref 0.0–0.7)
Eosinophils Relative: 5.9 % — ABNORMAL HIGH (ref 0.0–5.0)
HCT: 43.1 % (ref 39.0–52.0)
Hemoglobin: 14.4 g/dL (ref 13.0–17.0)
Lymphocytes Relative: 15.1 % (ref 12.0–46.0)
Lymphs Abs: 0.7 10*3/uL (ref 0.7–4.0)
MCHC: 33.3 g/dL (ref 30.0–36.0)
MCV: 89.4 fl (ref 78.0–100.0)
Monocytes Absolute: 0.6 10*3/uL (ref 0.1–1.0)
Monocytes Relative: 12.8 % — ABNORMAL HIGH (ref 3.0–12.0)
Neutro Abs: 3 10*3/uL (ref 1.4–7.7)
Neutrophils Relative %: 65.7 % (ref 43.0–77.0)
Platelets: 144 10*3/uL — ABNORMAL LOW (ref 150.0–400.0)
RBC: 4.82 Mil/uL (ref 4.22–5.81)
RDW: 15.3 % (ref 11.5–15.5)
WBC: 4.6 10*3/uL (ref 4.0–10.5)

## 2013-10-24 LAB — PSA: PSA: 0.32 ng/mL (ref 0.10–4.00)

## 2013-10-24 NOTE — Progress Notes (Signed)
Quick Note:  Please make copy of labs for patient visit. ______ 

## 2013-10-27 ENCOUNTER — Ambulatory Visit (INDEPENDENT_AMBULATORY_CARE_PROVIDER_SITE_OTHER): Payer: Medicare Other | Admitting: Cardiology

## 2013-10-27 ENCOUNTER — Encounter: Payer: Self-pay | Admitting: Cardiology

## 2013-10-27 VITALS — BP 105/67 | HR 56 | Ht 67.0 in | Wt 202.0 lb

## 2013-10-27 DIAGNOSIS — K429 Umbilical hernia without obstruction or gangrene: Secondary | ICD-10-CM

## 2013-10-27 DIAGNOSIS — Z79899 Other long term (current) drug therapy: Secondary | ICD-10-CM

## 2013-10-27 DIAGNOSIS — I259 Chronic ischemic heart disease, unspecified: Secondary | ICD-10-CM

## 2013-10-27 DIAGNOSIS — I119 Hypertensive heart disease without heart failure: Secondary | ICD-10-CM

## 2013-10-27 DIAGNOSIS — E785 Hyperlipidemia, unspecified: Secondary | ICD-10-CM

## 2013-10-27 DIAGNOSIS — D500 Iron deficiency anemia secondary to blood loss (chronic): Secondary | ICD-10-CM

## 2013-10-27 NOTE — Assessment & Plan Note (Signed)
The patient has had no recurrent chest pain or angina pectoris.

## 2013-10-27 NOTE — Progress Notes (Signed)
Douglas [name] Date of Birth:  1945/05/09 [facility] [address] [employer] [place], [place]  [address] [phone]        Fax   [phone]   History of Present Illness: This pleasant 68 year old gentleman is seen for a four-month followup office visit. He has a history of ischemic heart disease. 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 He has not had bypass surgery. A nuclear stress test in February 2011 showed no reversible ischemia. He does have a large scar involving the apex and the anterior distal and anteroseptal walls. His ejection fraction was 57%. More recently the patient went to the emergency room in July 2014 he ruled out for a myocardial infarction. He had a subsequent outpatient nuclear stress test 11/18/12 showing no evidence of ischemia and there was an old anterior scar and his ejection fraction was 55%. Patient also has a past history of chronic GI bleeding managed with low-dose intermittent oral iron therapy. Recently his iron therapy was stopped but his hemoglobin has remained in normal range. He reminds me that the source of the GI bleeding was never actually pinpointed. he has a history of high blood pressure with elevated diastolics here in the office but blood pressure often normal at home.  The patient has seen Dr. Baird Lyons and has been treated for sleep apnea with CPAP machine.  He feels that he has done very well with the CPAP machine. The patient has a known umbilical hernia.  Current Outpatient Prescriptions  Medication Sig Dispense Refill  . amLODipine (NORVASC) 10 MG tablet Take 1 tablet (10 mg total) by mouth daily.  30 tablet  6  . aspirin 81 MG EC tablet Take 81 mg by mouth daily.        . carvedilol (COREG) 25 MG tablet Take 25 mg by mouth 2 (two) times daily with a meal.        . Cholecalciferol (VITAMIN D) 2000 UNITS tablet Take 2,000 Units by mouth daily.        . clopidogrel (PLAVIX) 75 MG tablet Take 1 tablet  (75 mg total) by mouth daily.  30 tablet  11  . colchicine 0.6 MG tablet Take 0.6 mg by mouth daily as needed (gout).       . fexofenadine (ALLEGRA) 180 MG tablet Take 180 mg by mouth daily as needed (allergies).       . furosemide (LASIX) 20 MG tablet Take 20 mg by mouth daily as needed for fluid or edema.       . nitroGLYCERIN (NITROSTAT) 0.4 MG SL tablet Place 0.4 mg under the tongue every 5 (five) minutes as needed for chest pain.       . pantoprazole (PROTONIX) 40 MG tablet Take 40 mg by mouth every other day.      . simvastatin (ZOCOR) 20 MG tablet Take 20 mg by mouth at bedtime.        . valACYclovir (VALTREX) 1000 MG tablet Take 1,000 mg by mouth daily as needed (fever blisters).        No current facility-administered medications for this visit.    Allergies  Allergen Reactions  . Ace Inhibitors     Cough  . Lisinopril     cough  . Losartan     Cough    Patient Active Problem List   Diagnosis Date Noted  . Umbilical hernia 85/88/5027  . Obstructive sleep apnea 12/22/2012  . Atypical chest pain  11/06/2012  . Hiatal hernia 11/06/2012  . Stented coronary artery 11/06/2012  . Ischemic heart disease 10/01/2010  . Benign hypertensive heart disease without heart failure 10/01/2010  . Dyslipidemia 10/01/2010  . 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 Former Smoker -- 5.00 packs/day for 10 years  . Types: Cigarettes  . Quit date: 04/21/1968  Smokeless tobacco  . Former Systems developer  . Quit date: 04/21/1978    History  Alcohol Use  . Yes    Comment: occasional    Family History  Problem Relation Age of Onset  . Heart disease Mother   . Diabetes Mother   . Heart disease Father   . Diabetes Father     Review of Systems: Constitutional: no fever chills diaphoresis or fatigue or change in weight.  Head and neck: no hearing loss, no epistaxis, no photophobia or visual disturbance. Respiratory: No cough,  shortness of breath or wheezing. Cardiovascular: No chest pain peripheral edema, palpitations. Gastrointestinal: No abdominal distention, no abdominal pain, no change in bowel habits hematochezia or melena. Genitourinary: No dysuria, no frequency, no urgency, no nocturia. Musculoskeletal:No arthralgias, no back pain, no gait disturbance or myalgias. Neurological: No dizziness, no headaches, no numbness, no seizures, no syncope, no weakness, no tremors. Hematologic: No lymphadenopathy, no easy bruising. Psychiatric: No confusion, no hallucinations, no sleep disturbance.    Physical Exam: Filed Vitals:   10/27/13 0924  BP: 105/67  Pulse: 56   the general appearance reveals a well-developed well-nourished gentleman in no distress.The head and neck exam reveals pupils equal and reactive.  Extraocular movements are full.  There is no scleral icterus.  The mouth and pharynx are normal.  The neck is supple.  The carotids reveal no bruits.  The jugular venous pressure is normal.  The  thyroid is not enlarged.  There is no lymphadenopathy.  The chest is clear to percussion and auscultation.  There are no rales or rhonchi.  Expansion of the chest is symmetrical.  The precordium is quiet.  The first heart sound is normal.  The second heart sound is physiologically split.  There is no murmur gallop rub or click.  There is no abnormal lift or heave.  The abdomen is soft and nontender.  The bowel sounds are normal.  The liver and spleen are not enlarged.  There are no abdominal masses.  There is a umbilical hernia measuring approximately 2-3 inches in diameter.  It is nontender and is easily reducible.  There are no abdominal bruits.  Extremities reveal good pedal pulses.  There is no phlebitis or edema.  There is no cyanosis or clubbing.  Strength is normal and symmetrical in all extremities.  There is no lateralizing weakness.  There are no sensory deficits.  The skin is warm and dry.  There is no  rash.     Assessment / Plan: 1. ischemic heart disease status post large anterior wall myocardial infarction in July 2007.  Most recent nuclear stress test 11/18/12 showing large anterior scar, no ischemia, and overall ejection fraction 55%. 2. past history of iron deficiency anemia with chronic GI blood loss of undetermined source. 3. hiatal hernia 4. umbilical hernia 5. Hypercholesterolemia 6. benign hypertensive heart disease without heart failure 7. obstructive sleep apnea doing well on CPAP machine, followed by Dr. Annamaria Boots  Continue current medication except we will reduce his Protonix to just every other day at his request Discuss possible surgical referral again next  visit regarding his umbilical hernia.  In the meantime work harder on weight loss. Return in 4 months for office visit CBC lipid panel hepatic function panel nasal metabolic panel and vitamin D level.

## 2013-10-27 NOTE — Assessment & Plan Note (Signed)
The patient has a known the local hernia.  It is bothering him a little bit more.  We talked about a surgery referral.  He would like to work on his weight better and discuss again next time.  Dr. Johney Maine would be his preferred surgeon since Dr. Johney Maine has worked on his daughter.

## 2013-10-27 NOTE — Patient Instructions (Signed)
DECREASE YOUR PROTONIX TO EVERY OTHER DAY   Work harder on diet and weight loss  Your physician wants you to follow-up in: 4 months with fasting labs (lp/bmet/hfp/cbc)  You will receive a reminder letter in the mail two months in advance. If you don't receive a letter, please call our office to schedule the follow-up appointment.

## 2013-10-27 NOTE — Assessment & Plan Note (Signed)
The patient has a history of hypercholesterolemia.  He is on simvastatin 20 mg daily.  Blood work was reviewed and is satisfactory.

## 2013-11-14 ENCOUNTER — Other Ambulatory Visit: Payer: Self-pay | Admitting: Cardiology

## 2013-12-20 ENCOUNTER — Other Ambulatory Visit: Payer: Self-pay | Admitting: *Deleted

## 2013-12-20 DIAGNOSIS — I519 Heart disease, unspecified: Secondary | ICD-10-CM

## 2013-12-20 MED ORDER — CLOPIDOGREL BISULFATE 75 MG PO TABS: 75.0000 mg | ORAL_TABLET | Freq: Every day | ORAL | Status: DC

## 2014-02-01 ENCOUNTER — Other Ambulatory Visit: Payer: Self-pay | Admitting: *Deleted

## 2014-02-01 DIAGNOSIS — I119 Hypertensive heart disease without heart failure: Secondary | ICD-10-CM

## 2014-02-01 MED ORDER — AMLODIPINE BESYLATE 10 MG PO TABS: 10.0000 mg | ORAL_TABLET | Freq: Every day | ORAL | Status: DC

## 2014-02-23 ENCOUNTER — Other Ambulatory Visit: Payer: Self-pay

## 2014-02-23 ENCOUNTER — Other Ambulatory Visit: Payer: Self-pay | Admitting: Cardiology

## 2014-02-23 DIAGNOSIS — I519 Heart disease, unspecified: Secondary | ICD-10-CM

## 2014-02-23 MED ORDER — CLOPIDOGREL BISULFATE 75 MG PO TABS: 75.0000 mg | ORAL_TABLET | Freq: Every day | ORAL | Status: DC

## 2014-03-08 ENCOUNTER — Other Ambulatory Visit (INDEPENDENT_AMBULATORY_CARE_PROVIDER_SITE_OTHER): Payer: Medicare Other | Admitting: *Deleted

## 2014-03-08 DIAGNOSIS — D500 Iron deficiency anemia secondary to blood loss (chronic): Secondary | ICD-10-CM

## 2014-03-08 DIAGNOSIS — Z79899 Other long term (current) drug therapy: Secondary | ICD-10-CM

## 2014-03-08 DIAGNOSIS — I119 Hypertensive heart disease without heart failure: Secondary | ICD-10-CM

## 2014-03-08 LAB — HEPATIC FUNCTION PANEL
ALT: 17 U/L (ref 0–53)
AST: 15 U/L (ref 0–37)
Albumin: 4 g/dL (ref 3.5–5.2)
Alkaline Phosphatase: 62 U/L (ref 39–117)
Bilirubin, Direct: 0.1 mg/dL (ref 0.0–0.3)
Total Bilirubin: 1 mg/dL (ref 0.2–1.2)
Total Protein: 6.4 g/dL (ref 6.0–8.3)

## 2014-03-08 LAB — CBC WITH DIFFERENTIAL/PLATELET
Basophils Absolute: 0 10*3/uL (ref 0.0–0.1)
Basophils Relative: 0.3 % (ref 0.0–3.0)
Eosinophils Absolute: 0.2 10*3/uL (ref 0.0–0.7)
Eosinophils Relative: 1.5 % (ref 0.0–5.0)
HCT: 46.8 % (ref 39.0–52.0)
Hemoglobin: 15.2 g/dL (ref 13.0–17.0)
Lymphocytes Relative: 8.5 % — ABNORMAL LOW (ref 12.0–46.0)
Lymphs Abs: 0.9 10*3/uL (ref 0.7–4.0)
MCHC: 32.4 g/dL (ref 30.0–36.0)
MCV: 91.1 fl (ref 78.0–100.0)
Monocytes Absolute: 1.2 10*3/uL — ABNORMAL HIGH (ref 0.1–1.0)
Monocytes Relative: 12.3 % — ABNORMAL HIGH (ref 3.0–12.0)
Neutro Abs: 7.8 10*3/uL — ABNORMAL HIGH (ref 1.4–7.7)
Neutrophils Relative %: 77.4 % — ABNORMAL HIGH (ref 43.0–77.0)
Platelets: 162 10*3/uL (ref 150.0–400.0)
RBC: 5.14 Mil/uL (ref 4.22–5.81)
RDW: 14.9 % (ref 11.5–15.5)
WBC: 10.1 10*3/uL (ref 4.0–10.5)

## 2014-03-08 LAB — BASIC METABOLIC PANEL
BUN: 16 mg/dL (ref 6–23)
CO2: 25 mEq/L (ref 19–32)
Calcium: 9.3 mg/dL (ref 8.4–10.5)
Chloride: 109 mEq/L (ref 96–112)
Creatinine, Ser: 1.2 mg/dL (ref 0.4–1.5)
GFR: 64.44 mL/min (ref >60.00)
Glucose, Bld: 113 mg/dL — ABNORMAL HIGH (ref 70–99)
Potassium: 4.6 mEq/L (ref 3.5–5.1)
Sodium: 146 mEq/L — ABNORMAL HIGH (ref 135–145)

## 2014-03-08 LAB — LIPID PANEL
Cholesterol: 123 mg/dL (ref 0–200)
HDL: 34.4 mg/dL — ABNORMAL LOW
LDL Cholesterol: 73 mg/dL (ref 0–99)
NonHDL: 88.6
Total CHOL/HDL Ratio: 4
Triglycerides: 77 mg/dL (ref 0.0–149.0)
VLDL: 15.4 mg/dL (ref 0.0–40.0)

## 2014-03-08 LAB — VITAMIN D 25 HYDROXY (VIT D DEFICIENCY, FRACTURES): VITD: 33.49 ng/mL (ref 30.00–100.00)

## 2014-03-08 NOTE — Progress Notes (Signed)
Quick Note:  Please make copy of labs for patient visit. ______ 

## 2014-03-14 ENCOUNTER — Other Ambulatory Visit: Payer: Self-pay

## 2014-03-14 MED ORDER — PANTOPRAZOLE SODIUM 40 MG PO TBEC: 40.0000 mg | DELAYED_RELEASE_TABLET | ORAL | Status: DC

## 2014-03-15 ENCOUNTER — Encounter: Payer: Self-pay | Admitting: Cardiology

## 2014-03-15 ENCOUNTER — Ambulatory Visit (INDEPENDENT_AMBULATORY_CARE_PROVIDER_SITE_OTHER): Payer: Medicare Other | Admitting: Cardiology

## 2014-03-15 VITALS — BP 102/64 | HR 64 | Ht 67.0 in | Wt 196.0 lb

## 2014-03-15 DIAGNOSIS — I119 Hypertensive heart disease without heart failure: Secondary | ICD-10-CM

## 2014-03-15 DIAGNOSIS — R059 Cough, unspecified: Secondary | ICD-10-CM

## 2014-03-15 DIAGNOSIS — R05 Cough: Secondary | ICD-10-CM

## 2014-03-15 DIAGNOSIS — I259 Chronic ischemic heart disease, unspecified: Secondary | ICD-10-CM

## 2014-03-15 DIAGNOSIS — K429 Umbilical hernia without obstruction or gangrene: Secondary | ICD-10-CM

## 2014-03-15 MED ORDER — HYDROCOD POLST-CHLORPHEN POLST 10-8 MG/5ML PO LQCR: 5.0000 mL | Freq: Two times a day (BID) | ORAL | Status: DC | PRN

## 2014-03-15 MED ORDER — AZITHROMYCIN 250 MG PO TABS: ORAL_TABLET | ORAL | Status: DC

## 2014-03-15 NOTE — Assessment & Plan Note (Signed)
The patient has been walking for exercise.  He has not been experiencing any chest pain or angina.  His weight is down 6 pounds since last visit.  Weight is 196 today.

## 2014-03-15 NOTE — Progress Notes (Signed)
Douglas [name] Date of Birth:  03/13/1946 [facility] [address] [place], [place]  [address] [phone]        Fax   [phone]   History of Present Illness: This pleasant 68 year old gentleman is seen for a four-month followup office visit. He has a history of ischemic heart disease. 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 He has not had bypass surgery. A nuclear stress test in February 2011 showed no reversible ischemia. He does have a large scar involving the apex and the anterior distal and anteroseptal walls. His ejection fraction was 57%. More recently the patient went to the emergency room in July 2014 he ruled out for a myocardial infarction. He had a subsequent outpatient nuclear stress test 11/18/12 showing no evidence of ischemia and there was an old anterior scar and his ejection fraction was 55%. Patient also has a past history of chronic GI bleeding managed with low-dose intermittent oral iron therapy. Recently his iron therapy was stopped but his hemoglobin has remained in normal range. He reminds me that the source of the GI bleeding was never actually pinpointed. he has a history of high blood pressure with elevated diastolics here in the office but blood pressure often normal at home.  The patient has seen Dr. Baird Lyons and has been treated for sleep apnea with CPAP machine.  He feels that he has done very well with the CPAP machine. The patient has a known umbilical hernia.  He would like Dr. Neysa Bonito to fix this.  We will make the referral for him.  Dr. Johney Maine has taken care of their daughter.  Current Outpatient Prescriptions  Medication Sig Dispense Refill  . amLODipine (NORVASC) 10 MG tablet Take 1 tablet (10 mg total) by mouth daily. 30 tablet 11  . aspirin 81 MG EC tablet Take 81 mg by mouth daily.      . carvedilol (COREG) 25 MG tablet Take 25 mg by mouth 2 (two) times daily with a meal.      . Cholecalciferol  (VITAMIN D) 2000 UNITS tablet Take 2,000 Units by mouth daily.      . clopidogrel (PLAVIX) 75 MG tablet Take 1 tablet (75 mg total) by mouth daily. 30 tablet 0  . colchicine 0.6 MG tablet Take 0.6 mg by mouth daily as needed (gout).     . fexofenadine (ALLEGRA) 180 MG tablet Take 180 mg by mouth daily as needed (allergies).     . fluticasone (FLONASE) 50 MCG/ACT nasal spray     . furosemide (LASIX) 20 MG tablet Take 20 mg by mouth daily as needed for fluid or edema.     . nitroGLYCERIN (NITROSTAT) 0.4 MG SL tablet Place 0.4 mg under the tongue every 5 (five) minutes as needed for chest pain.     . pantoprazole (PROTONIX) 40 MG tablet Take 1 tablet (40 mg total) by mouth every other day. 15 tablet 3  . simvastatin (ZOCOR) 20 MG tablet Take 20 mg by mouth at bedtime.      . valACYclovir (VALTREX) 1000 MG tablet Take 1,000 mg by mouth daily as needed (fever blisters).     Marland Kitchen azithromycin (ZITHROMAX) 250 MG tablet 2 TABLETS BY MOUTH TODAY AND THEN 1 DAILY UNTIL FINISHES 6 each 0  . chlorpheniramine-HYDROcodone (TUSSIONEX PENNKINETIC ER) 10-8 MG/5ML LQCR Take 5 mLs by mouth every 12 (twelve) hours as needed for cough. 120 mL 0   No current facility-administered  medications for this visit.    Allergies  Allergen Reactions  . Ace Inhibitors     Cough  . Lisinopril     cough  . Losartan     Cough    Patient Active Problem List   Diagnosis Date Noted  . Cough 03/15/2014  . Umbilical hernia 40/98/1191  . Obstructive sleep apnea 12/22/2012  . Atypical chest pain 11/06/2012  . Hiatal hernia 11/06/2012  . Stented coronary artery 11/06/2012  . Ischemic heart disease 10/01/2010  . Benign hypertensive heart disease without heart failure 10/01/2010  . Dyslipidemia 10/01/2010  . 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 Former Smoker -- 5.00 packs/day for 10 years  . Types: Cigarettes  . Quit date: 04/21/1968  Smokeless tobacco  .  Former Systems developer  . Quit date: 04/21/1978    History  Alcohol Use  . Yes    Comment: occasional    Family History  Problem Relation Age of Onset  . Heart disease Mother   . Diabetes Mother   . Heart disease Father   . Diabetes Father     Review of Systems: Constitutional: no fever chills diaphoresis or fatigue or change in weight.  Head and neck: no hearing loss, no epistaxis, no photophobia or visual disturbance. Respiratory: No cough, shortness of breath or wheezing. Cardiovascular: No chest pain peripheral edema, palpitations. Gastrointestinal: No abdominal distention, no abdominal pain, no change in bowel habits hematochezia or melena. Genitourinary: No dysuria, no frequency, no urgency, no nocturia. Musculoskeletal:No arthralgias, no back pain, no gait disturbance or myalgias. Neurological: No dizziness, no headaches, no numbness, no seizures, no syncope, no weakness, no tremors. Hematologic: No lymphadenopathy, no easy bruising. Psychiatric: No confusion, no hallucinations, no sleep disturbance.    Physical Exam: Filed Vitals:   03/15/14 0831  BP: 102/64  Pulse: 64   the general appearance reveals a well-developed well-nourished gentleman in no distress.The head and neck exam reveals pupils equal and reactive.  Extraocular movements are full.  There is no scleral icterus.  The mouth and pharynx are normal.  The neck is supple.  The carotids reveal no bruits.  The jugular venous pressure is normal.  The  thyroid is not enlarged.  There is no lymphadenopathy.  The chest is clear to percussion and auscultation.  There are no rales or rhonchi.  Expansion of the chest is symmetrical.  The precordium is quiet.  The first heart sound is normal.  The second heart sound is physiologically split.  There is no murmur gallop rub or click.  There is no abnormal lift or heave.  The abdomen is soft and nontender.  The bowel sounds are normal.  The liver and spleen are not enlarged.  There are  no abdominal masses.  There is a umbilical hernia measuring approximately 2-3 inches in diameter.  It is nontender and is easily reducible.  There are no abdominal bruits.  Extremities reveal good pedal pulses.  There is no phlebitis or edema.  There is no cyanosis or clubbing.  Strength is normal and symmetrical in all extremities.  There is no lateralizing weakness.  There are no sensory deficits.  The skin is warm and dry.  There is no rash.  EKG today shows normal sinus rhythm with old anteroseptal myocardial infarction and no acute changes   Assessment / Plan: 1. ischemic heart disease status post large anterior wall myocardial infarction in July 2007.  Most recent nuclear  stress test 11/18/12 showing large anterior scar, no ischemia, and overall ejection fraction 55%. 2. past history of iron deficiency anemia with chronic GI blood loss of undetermined source. 3. hiatal hernia 4. umbilical hernia 5. Hypercholesterolemia 6. benign hypertensive heart disease without heart failure 7. obstructive sleep apnea doing well on CPAP machine, followed by Dr. Annamaria Boots 8.  Cough secondary to upper respiratory infection with bronchitis  Continue current medication.  Treat his cough with Mucinex, Zithromax, and Tussionex Surgical referral to Dr. Maxwell Marion in 4 months for office visit CBC lipid panel hepatic function panel and basal metabolic panel

## 2014-03-15 NOTE — Patient Instructions (Addendum)
START ZPAK AS DIRECTED  START [place] 5 CC EVERY 12 HOURS AS NEEDED FOR COUGH  Your physician recommends that you schedule a follow-up appointment in: 4 months with fasting labs (lp/bmet/hfp/cbc)  Will arrange for you to see Dr Johney Maine as CCS [phone] and call you with an appointment

## 2014-03-15 NOTE — Assessment & Plan Note (Signed)
Blood pressure is remaining stable on current therapy.  No dizziness or syncope.  No headaches.  No symptoms of CHF.

## 2014-03-15 NOTE — Assessment & Plan Note (Signed)
Referred to Dr. [name].

## 2014-03-15 NOTE — Assessment & Plan Note (Signed)
The patient has had a cough for about a week.  He has been on mucin neck.  His sputum is dark with some green coloring.  We will give him a Zithromax Z-Pak.  We will also give him Tussionex to suppress his cough at night at his request.

## 2014-03-20 ENCOUNTER — Other Ambulatory Visit: Payer: Self-pay | Admitting: Cardiology

## 2014-03-21 ENCOUNTER — Encounter: Payer: Self-pay | Admitting: Cardiology

## 2014-03-21 ENCOUNTER — Other Ambulatory Visit (INDEPENDENT_AMBULATORY_CARE_PROVIDER_SITE_OTHER): Payer: Self-pay | Admitting: Surgery

## 2014-03-21 NOTE — Progress Notes (Signed)
Thanks for the quick cardiac clearance

## 2014-03-21 NOTE — Progress Notes (Signed)
Yes the patient can stop his plavix 5 days before surgery. Continue baby ASA.

## 2014-03-21 NOTE — H&P (Signed)
Bunnie Philips. [place] 03/21/2014 2:36 PM Location: [facility] Patient #: [id number] DOB: 24-Dec-1945 Married / Language: Cleophus Molt / Race: White Male History of Present Illness Adin Hector MD; 03/21/2014 3:14 PM) Patient words: umb hernia.  The patient is a 68 year old male who presents with an umbilical hernia. Patient sent to me by Dr. Darlin Coco over concerns of an umbilical hernia.  I had operated on his daughter. He requested me for his surgeon. Patient with hernia at his belly button for over a decade. More recently he thinks it has gotten larger. It used to be reducible. Now stuck. It has increased in size. Some change in coloring as well. Discussed with his cardiologist. Surgical consultation requested. History of myocardial infarction with stents 2007. On chronic Plavix anticoagulation. Can walk several miles without difficulty. No cardiac or stroke events since his heart attack in 2007. Has 2 bowel movements a day. He comes today with his wife. No history of other abdominal surgeries. No fevers chills or sweats. No history of wound infections or MRSA. Other Problems Briant Cedar, CMA; 03/21/2014 2:36 PM) Gastroesophageal Reflux Disease High blood pressure Hypercholesterolemia Kidney Stone Myocardial infarction Sleep Apnea Umbilical Hernia Repair  Past Surgical History Briant Cedar, CMA; 03/21/2014 2:36 PM) Cataract Surgery Left. Colon Polyp Removal - Colonoscopy Shoulder Surgery Right. Tonsillectomy  Diagnostic Studies History Briant Cedar, CMA; 03/21/2014 2:36 PM) Colonoscopy within last year  Allergies Briant Cedar, [place]; 03/21/2014 2:37 PM) ACE Inhibitors Lisinopril *ANTIHYPERTENSIVES* Losartan Potassium *ANTIHYPERTENSIVES*  Medication History Briant Cedar, CMA; 03/21/2014 2:39 PM) Clopidogrel Bisulfate (75MG  Tablet, Oral) Active. Pantoprazole Sodium (40MG  Tablet DR, Oral) Active. Coreg (25MG  Tablet,  Oral) Active. Baby Aspirin (81MG  Tablet Chewable, Oral) Active. Simvastatin (20MG  Tablet, Oral) Active. AmLODIPine Besylate (10MG  Tablet, Oral) Active. Cholecalciferol (2000UNIT Capsule, Oral) Active.  Social History Briant Cedar, [place]; 03/21/2014 2:36 PM) Alcohol use Remotely quit alcohol use. Caffeine use Coffee. No drug use Tobacco use Former smoker.  Family History Briant Cedar, [place]; 03/21/2014 2:36 PM) Alcohol Abuse Brother. Arthritis Daughter, Mother. Colon Cancer Mother. Colon Polyps Mother. Heart Disease Daughter, Father, Mother. Heart disease in male family member before age 57 Seizure disorder Daughter.     Review of Systems Briant Cedar CMA; 03/21/2014 2:36 PM) General Not Present- Appetite Loss, Chills, Fatigue, Fever, Night Sweats, Weight Gain and Weight Loss. Skin Not Present- Change in Wart/Mole, Dryness, Hives, Jaundice, New Lesions, Non-Healing Wounds, Rash and Ulcer. HEENT Not Present- Earache, Hearing Loss, Hoarseness, Nose Bleed, Oral Ulcers, Ringing in the Ears, Seasonal Allergies, Sinus Pain, Sore Throat, Visual Disturbances, Wears glasses/contact lenses and Yellow Eyes. Respiratory Not Present- Bloody sputum, Chronic Cough, Difficulty Breathing, Snoring and Wheezing. Breast Not Present- Breast Mass, Breast Pain, Nipple Discharge and Skin Changes. Cardiovascular Not Present- Chest Pain, Difficulty Breathing Lying Down, Leg Cramps, Palpitations, Rapid Heart Rate, Shortness of Breath and Swelling of Extremities. Gastrointestinal Not Present- Abdominal Pain, Bloating, Bloody Stool, Change in Bowel Habits, Chronic diarrhea, Constipation, Difficulty Swallowing, Excessive gas, Gets full quickly at meals, Hemorrhoids, Indigestion, Nausea, Rectal Pain and Vomiting. Male Genitourinary Not Present- Blood in Urine, Change in Urinary Stream, Frequency, Impotence, Nocturia, Painful Urination, Urgency and Urine Leakage. Musculoskeletal Not Present-  Back Pain, Joint Pain, Joint Stiffness, Muscle Pain, Muscle Weakness and Swelling of Extremities. Neurological Not Present- Decreased Memory, Fainting, Headaches, Numbness, Seizures, Tingling, Tremor, Trouble walking and Weakness. Psychiatric Not Present- Anxiety, Bipolar, Change in Sleep Pattern, Depression, Fearful and Frequent crying. Endocrine Not Present- Cold Intolerance, Excessive Hunger, Hair Changes, Heat Intolerance, Hot  flashes and New Diabetes. Hematology Not Present- Easy Bruising, Excessive bleeding, Gland problems, HIV and Persistent Infections.  Vitals Briant Cedar CMA; 03/21/2014 2:42 PM) 03/21/2014 2:40 PM Weight: 198.25 lb Height: 67in Body Surface Area: 2.06 m Body Mass Index: 31.05 kg/m Temp.: 98.39F  Pulse: 61 (Regular)  BP: 104/72 (Sitting, Left Arm, Standard)     Physical Exam Adin Hector MD; 03/21/2014 3:09 PM)  General Mental Status-Alert. General Appearance-Not in acute distress, Not Sickly. Orientation-Oriented X3. Hydration-Well hydrated. Voice-Normal.  Integumentary Global Assessment Upon inspection and palpation of skin surfaces of the - Axillae: non-tender, no inflammation or ulceration, no drainage. and Distribution of scalp and body hair is normal. General Characteristics Temperature - normal warmth is noted.  Head and Neck Head-normocephalic, atraumatic with no lesions or palpable masses. Face Global Assessment - atraumatic, no absence of expression. Neck Global Assessment - no abnormal movements, no bruit auscultated on the right, no bruit auscultated on the left, no decreased range of motion, non-tender. Trachea-midline. Thyroid Gland Characteristics - non-tender.  Eye Eyeball - Left-Extraocular movements intact, No Nystagmus. Eyeball - Right-Extraocular movements intact, No Nystagmus. Cornea - Left-No Hazy. Cornea - Right-No Hazy. Sclera/Conjunctiva - Left-No scleral icterus, No  Discharge. Sclera/Conjunctiva - Right-No scleral icterus, No Discharge. Pupil - Left-Direct reaction to light normal. Pupil - Right-Direct reaction to light normal.  ENMT Ears Pinna - Left - no drainage observed, no generalized tenderness observed. Right - no drainage observed, no generalized tenderness observed. Nose and Sinuses External Inspection of the Nose - no destructive lesion observed. Inspection of the nares - Left - quiet respiration. Right - quiet respiration. Mouth and Throat Lips - Upper Lip - no fissures observed, no pallor noted. Lower Lip - no fissures observed, no pallor noted. Nasopharynx - no discharge present. Oral Cavity/Oropharynx - Tongue - no dryness observed. Oral Mucosa - no cyanosis observed. Hypopharynx - no evidence of airway distress observed.  Chest and Lung Exam Inspection Movements - Normal and Symmetrical. Accessory muscles - No use of accessory muscles in breathing. Palpation Palpation of the chest reveals - Non-tender. Auscultation Breath sounds - Normal and Clear.  Cardiovascular Auscultation Rhythm - Regular. Murmurs & Other Heart Sounds - Auscultation of the heart reveals - No Murmurs and No Systolic Clicks.  Abdomen Inspection Inspection of the abdomen reveals - No Visible peristalsis and No Abnormal pulsations. Umbilicus - No Bleeding, No Urine drainage. Palpation/Percussion Palpation and Percussion of the abdomen reveal - Soft, Non Tender, No Rebound tenderness, No Rigidity (guarding) and No Cutaneous hyperesthesia. Note: Obese but soft. Obvious umbilical hernia 3 cm. Incarcerated. Soft.   Male Genitourinary Sexual Maturity Tanner 5 - Adult hair pattern and Adult penile size and shape. Note: Normal external male genitalia. Testes normal. Left greater than right inguinal hernias. Reducible.   Peripheral Vascular Upper Extremity Inspection - Left - No Cyanotic nailbeds, Not Ischemic. Right - No Cyanotic nailbeds, Not  Ischemic.  Neurologic Neurologic evaluation reveals -normal attention span and ability to concentrate, able to name objects and repeat phrases. Appropriate fund of knowledge , normal sensation and normal coordination. Mental Status Affect - not angry, not paranoid. Cranial Nerves-Normal Bilaterally. Gait-Normal.  Neuropsychiatric Mental status exam performed with findings of-able to articulate well with normal speech/language, rate, volume and coherence, thought content normal with ability to perform basic computations and apply abstract reasoning and no evidence of hallucinations, delusions, obsessions or homicidal/suicidal ideation.  Musculoskeletal Global Assessment Spine, Ribs and Pelvis - no instability, subluxation or laxity. Right Upper Extremity -  no instability, subluxation or laxity.  Lymphatic Head & Neck  General Head & Neck Lymphatics: Bilateral - Description - No Localized lymphadenopathy. Axillary  General Axillary Region: Bilateral - Description - No Localized lymphadenopathy. Femoral & Inguinal  Generalized Femoral & Inguinal Lymphatics: Left - Description - No Localized lymphadenopathy. Right - Description - No Localized lymphadenopathy.    Assessment & Plan Adin Hector MD; 80/05/2334 1:22 PM)  UMBILICAL HERNIA, INCARCERATED (552.1  K42.0) Impression: I think he would benefit from surgical repair since it is becoming larger, is now incarcerated, has become symptomatic. Given his obesity recommend mesh underlay repair. Laparoscopic approach. I'd recommend bilateral inguinal hernia repairs as well.  I would need cardiac clearance to make sure it is safe to come off Plavix. I'm assuming that its OK since his cardiologist was the one that sent the patient to me for surgical evaluation, but just double-check  Current Plans Schedule for Surgery [facility] Consent - Hernia Repair - Ventral/Incisional/Umbilical (Creedence Kunesh) BILATERAL INGUINAL HERNIA WITHOUT  OBSTRUCTION OR GANGRENE, RECURRENCE NOT SPECIFIED (550.92  K40.20) Impression: They are not large, but risk of incarceration is sized with inguinal hernias. I would recommend repair of these at the same time. He seemed hesitant at first but I did caution them that there is hernias will get worse once the umbilical hernia is repaired. Laparoscopic preperitoneal approach. Increased bleeding risk on Plavix but we'll try to be diligent.  Current Plans [facility] Consent - Hernia Repair Inguinal/Pelvic (Elzy Tomasello): discussed with patient and provided information. Schedule for Surgery Pt Education - [facility] Hernia Post-Op HCI (Damariz Paganelli): discussed with patient and provided information. Pt Education - [facility] Pain Control (Iliany Losier) Pt Education - [facility] Good Bowel Health (Zylee Marchiano)  Adin Hector, M.D., F.A.C.S. Gastrointestinal and Minimally Invasive Surgery [facility] [address] [place], [place] [address] [phone] Main / Paging

## 2014-04-18 ENCOUNTER — Other Ambulatory Visit: Payer: Medicare Other

## 2014-04-26 ENCOUNTER — Encounter (HOSPITAL_COMMUNITY): Payer: Self-pay

## 2014-04-26 ENCOUNTER — Encounter (HOSPITAL_COMMUNITY)
Admission: RE | Admit: 2014-04-26 | Discharge: 2014-04-26 | Disposition: A | Discharge status: Home / Self Care | Payer: Medicare Other | Source: Ambulatory Visit | Attending: Surgery | Admitting: Surgery

## 2014-04-26 DIAGNOSIS — Z01812 Encounter for preprocedural laboratory examination: Secondary | ICD-10-CM | POA: Insufficient documentation

## 2014-04-26 LAB — BASIC METABOLIC PANEL
Anion gap: 9 (ref 5–15)
BUN: 14 mg/dL (ref 6–23)
CO2: 23 mmol/L (ref 19–32)
Calcium: 9 mg/dL (ref 8.4–10.5)
Chloride: 106 mEq/L (ref 96–112)
Creatinine, Ser: 1.03 mg/dL (ref 0.50–1.35)
GFR calc Af Amer: 84 mL/min — ABNORMAL LOW (ref >90)
GFR calc non Af Amer: 72 mL/min — ABNORMAL LOW (ref >90)
Glucose, Bld: 161 mg/dL — ABNORMAL HIGH (ref 70–99)
Potassium: 4 mmol/L (ref 3.5–5.1)
Sodium: 138 mmol/L (ref 135–145)

## 2014-04-26 LAB — CBC
HCT: 45 % (ref 39.0–52.0)
Hemoglobin: 15.2 g/dL (ref 13.0–17.0)
MCH: 29.7 pg (ref 26.0–34.0)
MCHC: 33.8 g/dL (ref 30.0–36.0)
MCV: 87.9 fL (ref 78.0–100.0)
Platelets: 162 10*3/uL (ref 150–400)
RBC: 5.12 MIL/uL (ref 4.22–5.81)
RDW: 13.9 % (ref 11.5–15.5)
WBC: 4.4 10*3/uL (ref 4.0–10.5)

## 2014-04-26 MED ORDER — CHLORHEXIDINE GLUCONATE 4 % EX LIQD: 1.0000 "application " | Freq: Once | CUTANEOUS | Status: DC

## 2014-04-26 NOTE — Pre-Procedure Instructions (Signed)
Douglas [name]  04/26/2014   Your procedure is scheduled on: Tuesday, Jan. 19th    Report to [facility] Admitting at 5:30 AM.   Call this number if you have problems the morning of surgery: [phone]   Remember:   Do not eat food or drink liquids after midnight Monday.   Take these medicines the morning of surgery with A SIP OF WATER: Prontonix, Coreg, Norvasc.  Please use Flonase the morning of surgery.   Do not wear jewelry - no rings or watches.  Do not wear lotions or colognes.   You may NOT wear deodorant.   Men may shave face and neck.   Do not bring valuables to the hospital.  [facility] is not responsible for any belongings or valuables.               Contacts, dentures or bridgework may not be worn into surgery.  Leave suitcase in the car. After surgery it may be brought to your room.  For patients admitted to the hospital, discharge time is determined by your treatment team.    Name and phone number of your driver:    Special Instructions: "Preparing for Surgery" instruction sheet.   Please read over the following fact sheets that you were given: Pain Booklet and Surgical Site Infection Prevention

## 2014-04-26 NOTE — Progress Notes (Signed)
[name] is aware of upcoming surgery.   He's cleared him for surgery.  [name] instructed him to stop plavix 5 days prior to surgery.

## 2014-05-02 ENCOUNTER — Telehealth: Payer: Self-pay | Admitting: Cardiology

## 2014-05-02 DIAGNOSIS — R059 Cough, unspecified: Secondary | ICD-10-CM

## 2014-05-02 DIAGNOSIS — R05 Cough: Secondary | ICD-10-CM

## 2014-05-02 MED ORDER — AZITHROMYCIN 250 MG PO TABS: ORAL_TABLET | ORAL | Status: DC

## 2014-05-02 NOTE — Telephone Encounter (Signed)
Has hernia surgery next week Tuesday  Patient has head congestion and using Mucinex Discussed with  [name] and ok to send in [place] patient

## 2014-05-02 NOTE — Telephone Encounter (Signed)
New message      Pt want something for a cold

## 2014-05-08 MED ORDER — CEFAZOLIN SODIUM-DEXTROSE 2-3 GM-% IV SOLR
2.0000 g | INTRAVENOUS | Status: AC
  Administered 2014-05-09: 2 g via INTRAVENOUS
  Filled 2014-05-08: qty 50

## 2014-05-09 ENCOUNTER — Ambulatory Visit (HOSPITAL_COMMUNITY): Payer: Medicare Other | Admitting: Critical Care Medicine

## 2014-05-09 ENCOUNTER — Observation Stay (HOSPITAL_COMMUNITY)
Admission: RE | Admit: 2014-05-09 | Discharge: 2014-05-10 | Disposition: A | Discharge status: Home / Self Care | Payer: Medicare Other | Source: Ambulatory Visit | Attending: Surgery | Admitting: Surgery

## 2014-05-09 ENCOUNTER — Encounter (HOSPITAL_COMMUNITY)
Admission: RE | Disposition: A | Discharge status: Home / Self Care | Payer: Self-pay | Source: Ambulatory Visit | Attending: Surgery

## 2014-05-09 ENCOUNTER — Encounter (HOSPITAL_COMMUNITY): Payer: Self-pay | Admitting: *Deleted

## 2014-05-09 DIAGNOSIS — Z87891 Personal history of nicotine dependence: Secondary | ICD-10-CM | POA: Insufficient documentation

## 2014-05-09 DIAGNOSIS — E780 Pure hypercholesterolemia: Secondary | ICD-10-CM | POA: Insufficient documentation

## 2014-05-09 DIAGNOSIS — E785 Hyperlipidemia, unspecified: Secondary | ICD-10-CM | POA: Insufficient documentation

## 2014-05-09 DIAGNOSIS — Z87442 Personal history of urinary calculi: Secondary | ICD-10-CM | POA: Diagnosis not present

## 2014-05-09 DIAGNOSIS — Z85828 Personal history of other malignant neoplasm of skin: Secondary | ICD-10-CM | POA: Diagnosis not present

## 2014-05-09 DIAGNOSIS — K4120 Bilateral femoral hernia, without obstruction or gangrene, not specified as recurrent: Secondary | ICD-10-CM | POA: Diagnosis not present

## 2014-05-09 DIAGNOSIS — M109 Gout, unspecified: Secondary | ICD-10-CM | POA: Insufficient documentation

## 2014-05-09 DIAGNOSIS — I2589 Other forms of chronic ischemic heart disease: Secondary | ICD-10-CM | POA: Insufficient documentation

## 2014-05-09 DIAGNOSIS — Z888 Allergy status to other drugs, medicaments and biological substances status: Secondary | ICD-10-CM | POA: Diagnosis not present

## 2014-05-09 DIAGNOSIS — K219 Gastro-esophageal reflux disease without esophagitis: Secondary | ICD-10-CM | POA: Diagnosis not present

## 2014-05-09 DIAGNOSIS — Z7901 Long term (current) use of anticoagulants: Secondary | ICD-10-CM | POA: Diagnosis not present

## 2014-05-09 DIAGNOSIS — I2510 Atherosclerotic heart disease of native coronary artery without angina pectoris: Secondary | ICD-10-CM | POA: Diagnosis not present

## 2014-05-09 DIAGNOSIS — Z9889 Other specified postprocedural states: Secondary | ICD-10-CM

## 2014-05-09 DIAGNOSIS — I252 Old myocardial infarction: Secondary | ICD-10-CM | POA: Insufficient documentation

## 2014-05-09 DIAGNOSIS — K420 Umbilical hernia with obstruction, without gangrene: Secondary | ICD-10-CM | POA: Diagnosis not present

## 2014-05-09 DIAGNOSIS — G4733 Obstructive sleep apnea (adult) (pediatric): Secondary | ICD-10-CM | POA: Insufficient documentation

## 2014-05-09 DIAGNOSIS — M1389 Other specified arthritis, multiple sites: Secondary | ICD-10-CM | POA: Diagnosis not present

## 2014-05-09 DIAGNOSIS — I10 Essential (primary) hypertension: Secondary | ICD-10-CM | POA: Diagnosis not present

## 2014-05-09 DIAGNOSIS — Z8719 Personal history of other diseases of the digestive system: Secondary | ICD-10-CM

## 2014-05-09 DIAGNOSIS — K4020 Bilateral inguinal hernia, without obstruction or gangrene, not specified as recurrent: Principal | ICD-10-CM | POA: Insufficient documentation

## 2014-05-09 HISTORY — DX: Personal history of other diseases of the musculoskeletal system and connective tissue: Z87.39

## 2014-05-09 HISTORY — DX: Gastro-esophageal reflux disease without esophagitis: K21.9

## 2014-05-09 HISTORY — DX: Personal history of other medical treatment: Z92.89

## 2014-05-09 HISTORY — DX: Unspecified malignant neoplasm of skin, unspecified: C44.90

## 2014-05-09 HISTORY — PX: INGUINAL HERNIA REPAIR: SHX194

## 2014-05-09 HISTORY — DX: Calculus of kidney: N20.0

## 2014-05-09 HISTORY — PX: INSERTION OF MESH: SHX5868

## 2014-05-09 HISTORY — DX: Atherosclerotic heart disease of native coronary artery without angina pectoris: I25.10

## 2014-05-09 HISTORY — DX: Chronic ischemic heart disease, unspecified: I25.9

## 2014-05-09 HISTORY — DX: Obstructive sleep apnea (adult) (pediatric): G47.33

## 2014-05-09 HISTORY — DX: Dependence on other enabling machines and devices: Z99.89

## 2014-05-09 HISTORY — PX: INGUINAL HERNIA REPAIR: SUR1180

## 2014-05-09 HISTORY — PX: UMBILICAL HERNIA REPAIR: SHX196

## 2014-05-09 SURGERY — REPAIR, HERNIA, INGUINAL, BILATERAL, LAPAROSCOPIC
Anesthesia: General | Site: Groin

## 2014-05-09 MED ORDER — ROCURONIUM BROMIDE 50 MG/5ML IV SOLN
INTRAVENOUS | Status: AC
  Filled 2014-05-09: qty 1

## 2014-05-09 MED ORDER — 0.9 % SODIUM CHLORIDE (POUR BTL) OPTIME
TOPICAL | Status: DC | PRN
  Administered 2014-05-09 (×2): 1000 mL

## 2014-05-09 MED ORDER — ROCURONIUM BROMIDE 100 MG/10ML IV SOLN
INTRAVENOUS | Status: DC | PRN
  Administered 2014-05-09: 5 mg via INTRAVENOUS
  Administered 2014-05-09 (×2): 10 mg via INTRAVENOUS
  Administered 2014-05-09: 40 mg via INTRAVENOUS
  Administered 2014-05-09: 10 mg via INTRAVENOUS
  Administered 2014-05-09: 5 mg via INTRAVENOUS

## 2014-05-09 MED ORDER — SUCCINYLCHOLINE CHLORIDE 20 MG/ML IJ SOLN
INTRAMUSCULAR | Status: AC
  Filled 2014-05-09: qty 1

## 2014-05-09 MED ORDER — ACETAMINOPHEN 325 MG PO TABS: 325.0000 mg | ORAL_TABLET | Freq: Four times a day (QID) | ORAL | Status: DC | PRN

## 2014-05-09 MED ORDER — NEOSTIGMINE METHYLSULFATE 10 MG/10ML IV SOLN
INTRAVENOUS | Status: AC
  Filled 2014-05-09: qty 1

## 2014-05-09 MED ORDER — MIDAZOLAM HCL 2 MG/2ML IJ SOLN
INTRAMUSCULAR | Status: AC
  Filled 2014-05-09: qty 2

## 2014-05-09 MED ORDER — EPHEDRINE SULFATE 50 MG/ML IJ SOLN
INTRAMUSCULAR | Status: DC | PRN
  Administered 2014-05-09: 5 mg via INTRAVENOUS
  Administered 2014-05-09: 15 mg via INTRAVENOUS
  Administered 2014-05-09: 5 mg via INTRAVENOUS
  Administered 2014-05-09: 10 mg via INTRAVENOUS

## 2014-05-09 MED ORDER — SODIUM CHLORIDE 0.9 % IR SOLN
Status: DC | PRN
  Administered 2014-05-09: 1000 mL

## 2014-05-09 MED ORDER — MEPERIDINE HCL 25 MG/ML IJ SOLN: 6.2500 mg | INTRAMUSCULAR | Status: DC | PRN

## 2014-05-09 MED ORDER — ONDANSETRON HCL 4 MG/2ML IJ SOLN: 4.0000 mg | Freq: Once | INTRAMUSCULAR | Status: DC | PRN

## 2014-05-09 MED ORDER — SODIUM CHLORIDE 0.9 % IJ SOLN
3.0000 mL | Freq: Two times a day (BID) | INTRAMUSCULAR | Status: DC
  Administered 2014-05-09: 3 mL via INTRAVENOUS

## 2014-05-09 MED ORDER — MENTHOL 3 MG MT LOZG: 1.0000 | LOZENGE | OROMUCOSAL | Status: DC | PRN

## 2014-05-09 MED ORDER — LACTATED RINGERS IV SOLN
INTRAVENOUS | Status: DC | PRN
  Administered 2014-05-09 (×2): via INTRAVENOUS

## 2014-05-09 MED ORDER — FENTANYL CITRATE 0.05 MG/ML IJ SOLN
INTRAMUSCULAR | Status: AC
  Filled 2014-05-09: qty 5

## 2014-05-09 MED ORDER — EPHEDRINE SULFATE 50 MG/ML IJ SOLN
INTRAMUSCULAR | Status: AC
  Filled 2014-05-09: qty 1

## 2014-05-09 MED ORDER — DEXAMETHASONE SODIUM PHOSPHATE 4 MG/ML IJ SOLN
INTRAMUSCULAR | Status: AC
  Filled 2014-05-09: qty 1

## 2014-05-09 MED ORDER — SODIUM CHLORIDE 0.9 % IJ SOLN
INTRAMUSCULAR | Status: AC
  Filled 2014-05-09: qty 10

## 2014-05-09 MED ORDER — FENTANYL CITRATE 0.05 MG/ML IJ SOLN
INTRAMUSCULAR | Status: DC | PRN
  Administered 2014-05-09 (×2): 50 ug via INTRAVENOUS
  Administered 2014-05-09: 150 ug via INTRAVENOUS
  Administered 2014-05-09: 50 ug via INTRAVENOUS

## 2014-05-09 MED ORDER — PHENYLEPHRINE HCL 10 MG/ML IJ SOLN
10.0000 mg | INTRAMUSCULAR | Status: DC | PRN
  Administered 2014-05-09: 20 ug/min via INTRAVENOUS

## 2014-05-09 MED ORDER — BUPIVACAINE-EPINEPHRINE (PF) 0.25% -1:200000 IJ SOLN
INTRAMUSCULAR | Status: AC
  Filled 2014-05-09: qty 90

## 2014-05-09 MED ORDER — LACTATED RINGERS IV BOLUS (SEPSIS): 1000.0000 mL | Freq: Three times a day (TID) | INTRAVENOUS | Status: DC | PRN

## 2014-05-09 MED ORDER — HYDROMORPHONE HCL 1 MG/ML IJ SOLN
INTRAMUSCULAR | Status: AC
  Filled 2014-05-09: qty 1

## 2014-05-09 MED ORDER — PROPOFOL 10 MG/ML IV BOLUS
INTRAVENOUS | Status: DC | PRN
  Administered 2014-05-09: 20 mg via INTRAVENOUS
  Administered 2014-05-09: 120 mg via INTRAVENOUS

## 2014-05-09 MED ORDER — HYDROMORPHONE HCL 1 MG/ML IJ SOLN
0.2500 mg | INTRAMUSCULAR | Status: DC | PRN
  Administered 2014-05-09 (×2): 0.25 mg via INTRAVENOUS
  Administered 2014-05-09: 0.5 mg via INTRAVENOUS

## 2014-05-09 MED ORDER — SODIUM CHLORIDE 0.9 % IV SOLN: 250.0000 mL | INTRAVENOUS | Status: DC | PRN

## 2014-05-09 MED ORDER — BLISTEX MEDICATED EX OINT
1.0000 "application " | TOPICAL_OINTMENT | Freq: Two times a day (BID) | CUTANEOUS | Status: DC
  Filled 2014-05-09: qty 10

## 2014-05-09 MED ORDER — ACETAMINOPHEN 650 MG RE SUPP: 650.0000 mg | Freq: Four times a day (QID) | RECTAL | Status: DC | PRN

## 2014-05-09 MED ORDER — MIDAZOLAM HCL 5 MG/5ML IJ SOLN
INTRAMUSCULAR | Status: DC | PRN
  Administered 2014-05-09 (×2): 1 mg via INTRAVENOUS

## 2014-05-09 MED ORDER — IPRATROPIUM-ALBUTEROL 0.5-2.5 (3) MG/3ML IN SOLN: 3.0000 mL | Freq: Four times a day (QID) | RESPIRATORY_TRACT | Status: DC | PRN

## 2014-05-09 MED ORDER — GLYCOPYRROLATE 0.2 MG/ML IJ SOLN
INTRAMUSCULAR | Status: DC | PRN
  Administered 2014-05-09 (×2): 0.1 mg via INTRAVENOUS
  Administered 2014-05-09: 0.6 mg via INTRAVENOUS

## 2014-05-09 MED ORDER — DEXAMETHASONE SODIUM PHOSPHATE 4 MG/ML IJ SOLN
INTRAMUSCULAR | Status: DC | PRN
  Administered 2014-05-09: 4 mg via INTRAVENOUS

## 2014-05-09 MED ORDER — FENTANYL CITRATE 0.05 MG/ML IJ SOLN
25.0000 ug | INTRAMUSCULAR | Status: DC | PRN
  Administered 2014-05-09: 25 ug via INTRAVENOUS
  Administered 2014-05-09: 19:00:00 via INTRAVENOUS
  Administered 2014-05-10 (×2): 50 ug via INTRAVENOUS
  Filled 2014-05-09 (×2): qty 2

## 2014-05-09 MED ORDER — NEOSTIGMINE METHYLSULFATE 10 MG/10ML IV SOLN
INTRAVENOUS | Status: DC | PRN
  Administered 2014-05-09: 4 mg via INTRAVENOUS

## 2014-05-09 MED ORDER — LIDOCAINE HCL (CARDIAC) 20 MG/ML IV SOLN
INTRAVENOUS | Status: DC | PRN
  Administered 2014-05-09: 100 mg via INTRAVENOUS

## 2014-05-09 MED ORDER — PROPOFOL 10 MG/ML IV BOLUS
INTRAVENOUS | Status: AC
  Filled 2014-05-09: qty 20

## 2014-05-09 MED ORDER — LIDOCAINE HCL (CARDIAC) 20 MG/ML IV SOLN
INTRAVENOUS | Status: AC
  Filled 2014-05-09: qty 5

## 2014-05-09 MED ORDER — PHENOL 1.4 % MT LIQD: 2.0000 | OROMUCOSAL | Status: DC | PRN

## 2014-05-09 MED ORDER — ALUM & MAG HYDROXIDE-SIMETH 200-200-20 MG/5ML PO SUSP: 30.0000 mL | Freq: Four times a day (QID) | ORAL | Status: DC | PRN

## 2014-05-09 MED ORDER — GLYCOPYRROLATE 0.2 MG/ML IJ SOLN
INTRAMUSCULAR | Status: AC
  Filled 2014-05-09: qty 3

## 2014-05-09 MED ORDER — CHLORHEXIDINE GLUCONATE 4 % EX LIQD
1.0000 "application " | Freq: Once | CUTANEOUS | Status: DC
  Filled 2014-05-09: qty 15

## 2014-05-09 MED ORDER — FENTANYL CITRATE 0.05 MG/ML IJ SOLN
INTRAMUSCULAR | Status: AC
  Filled 2014-05-09: qty 2

## 2014-05-09 MED ORDER — MAGIC MOUTHWASH: 15.0000 mL | Freq: Four times a day (QID) | ORAL | Status: DC | PRN

## 2014-05-09 MED ORDER — DIPHENHYDRAMINE HCL 50 MG/ML IJ SOLN: 12.5000 mg | Freq: Four times a day (QID) | INTRAMUSCULAR | Status: DC | PRN

## 2014-05-09 MED ORDER — OXYCODONE HCL 5 MG PO TABS
5.0000 mg | ORAL_TABLET | ORAL | Status: DC | PRN
  Administered 2014-05-09 – 2014-05-10 (×2): 10 mg via ORAL
  Filled 2014-05-09 (×2): qty 2
  Filled 2014-05-09 (×2): qty 1

## 2014-05-09 MED ORDER — OXYCODONE HCL 5 MG PO TABS: 5.0000 mg | ORAL_TABLET | ORAL | Status: DC | PRN

## 2014-05-09 MED ORDER — ONDANSETRON HCL 4 MG/2ML IJ SOLN
INTRAMUSCULAR | Status: AC
  Filled 2014-05-09: qty 2

## 2014-05-09 MED ORDER — SODIUM CHLORIDE 0.9 % IJ SOLN: 3.0000 mL | INTRAMUSCULAR | Status: DC | PRN

## 2014-05-09 MED ORDER — ONDANSETRON HCL 4 MG/2ML IJ SOLN
INTRAMUSCULAR | Status: DC | PRN
  Administered 2014-05-09: 4 mg via INTRAVENOUS

## 2014-05-09 MED ORDER — BUPIVACAINE-EPINEPHRINE 0.25% -1:200000 IJ SOLN
INTRAMUSCULAR | Status: DC | PRN
  Administered 2014-05-09: 89 mL

## 2014-05-09 MED ORDER — MORPHINE SULFATE 2 MG/ML IJ SOLN: 2.0000 mg | INTRAMUSCULAR | Status: DC | PRN

## 2014-05-09 SURGICAL SUPPLY — 74 items
APL SKNCLS STERI-STRIP NONHPOA (GAUZE/BANDAGES/DRESSINGS) ×3
APPLIER CLIP 5 13 M/L LIGAMAX5 (MISCELLANEOUS)
APR CLP MED LRG 5 ANG JAW (MISCELLANEOUS)
BENZOIN TINCTURE PRP APPL 2/3 (GAUZE/BANDAGES/DRESSINGS) ×1 IMPLANT
BINDER ABDOMINAL 12 ML 46-62 (SOFTGOODS) ×1 IMPLANT
BLADE SURG ROTATE 9660 (MISCELLANEOUS) ×1 IMPLANT
CANISTER SUCTION 2500CC (MISCELLANEOUS) ×1 IMPLANT
CHLORAPREP W/TINT 26ML (MISCELLANEOUS) ×4 IMPLANT
CLIP APPLIE 5 13 M/L LIGAMAX5 (MISCELLANEOUS) IMPLANT
COVER SURGICAL LIGHT HANDLE (MISCELLANEOUS) ×4 IMPLANT
DECANTER SPIKE VIAL GLASS SM (MISCELLANEOUS) ×2 IMPLANT
DEVICE SECURE STRAP 25 ABSORB (INSTRUMENTS) ×1 IMPLANT
DEVICE TROCAR PUNCTURE CLOSURE (ENDOMECHANICALS) ×1 IMPLANT
DRAPE LAPAROSCOPIC ABDOMINAL (DRAPES) ×4 IMPLANT
DRAPE PED LAPAROTOMY (DRAPES) ×3 IMPLANT
DRAPE UTILITY XL STRL (DRAPES) ×6 IMPLANT
DRAPE WARM FLUID 44X44 (DRAPE) ×4 IMPLANT
DRSG TEGADERM 2-3/8X2-3/4 SM (GAUZE/BANDAGES/DRESSINGS) ×4 IMPLANT
DRSG TEGADERM 4X4.75 (GAUZE/BANDAGES/DRESSINGS) ×4 IMPLANT
ELECT CAUTERY BLADE 6.4 (BLADE) ×4 IMPLANT
ELECT REM PT RETURN 9FT ADLT (ELECTROSURGICAL) ×4
ELECTRODE REM PT RTRN 9FT ADLT (ELECTROSURGICAL) ×3 IMPLANT
GAUZE SPONGE 2X2 8PLY STRL LF (GAUZE/BANDAGES/DRESSINGS) ×3 IMPLANT
GLOVE BIO SURGEON STRL SZ7 (GLOVE) ×2 IMPLANT
GLOVE BIOGEL PI IND STRL 6.5 (GLOVE) IMPLANT
GLOVE BIOGEL PI IND STRL 7.0 (GLOVE) IMPLANT
GLOVE BIOGEL PI IND STRL 8 (GLOVE) ×3 IMPLANT
GLOVE BIOGEL PI INDICATOR 6.5 (GLOVE) ×2
GLOVE BIOGEL PI INDICATOR 7.0 (GLOVE) ×2
GLOVE BIOGEL PI INDICATOR 8 (GLOVE) ×1
GLOVE ECLIPSE 8.0 STRL XLNG CF (GLOVE) ×4 IMPLANT
GLOVE SURG SS PI 7.0 STRL IVOR (GLOVE) ×1 IMPLANT
GOWN STRL REUS W/ TWL LRG LVL3 (GOWN DISPOSABLE) ×6 IMPLANT
GOWN STRL REUS W/ TWL XL LVL3 (GOWN DISPOSABLE) ×3 IMPLANT
GOWN STRL REUS W/TWL LRG LVL3 (GOWN DISPOSABLE) ×8
GOWN STRL REUS W/TWL XL LVL3 (GOWN DISPOSABLE) ×4
KIT BASIN OR (CUSTOM PROCEDURE TRAY) ×4 IMPLANT
KIT ROOM TURNOVER OR (KITS) ×4 IMPLANT
MESH ULTRAPRO 6X6 15CM15CM (Mesh General) ×2 IMPLANT
MESH VENTRALIGHT ST 6X8 (Mesh Specialty) ×4 IMPLANT
MESH VENTRLGHT ELLIPSE 8X6XMFL (Mesh Specialty) IMPLANT
NEEDLE 22X1 1/2 (OR ONLY) (NEEDLE) ×3 IMPLANT
NS IRRIG 1000ML POUR BTL (IV SOLUTION) ×4 IMPLANT
PACK SURGICAL SETUP 50X90 (CUSTOM PROCEDURE TRAY) ×4 IMPLANT
PAD ARMBOARD 7.5X6 YLW CONV (MISCELLANEOUS) ×8 IMPLANT
PENCIL BUTTON HOLSTER BLD 10FT (ELECTRODE) ×4 IMPLANT
SCISSORS LAP 5X35 DISP (ENDOMECHANICALS) ×1 IMPLANT
SET IRRIG TUBING LAPAROSCOPIC (IRRIGATION / IRRIGATOR) ×1 IMPLANT
SPONGE GAUZE 2X2 STER 10/PKG (GAUZE/BANDAGES/DRESSINGS) ×1
SPONGE LAP 18X18 X RAY DECT (DISPOSABLE) ×3 IMPLANT
STRIP CLOSURE SKIN 1/2X4 (GAUZE/BANDAGES/DRESSINGS) ×2 IMPLANT
SUT ETHIBOND NAB CT1 #1 30IN (SUTURE) IMPLANT
SUT MNCRL AB 4-0 PS2 18 (SUTURE) ×4 IMPLANT
SUT PDS AB 1 CT  36 (SUTURE) ×2
SUT PDS AB 1 CT 36 (SUTURE) ×6 IMPLANT
SUT PROLENE 0 CT 1 30 (SUTURE) ×16 IMPLANT
SUT PROLENE 1 CT (SUTURE) ×10 IMPLANT
SUT VIC AB 0 CT1 27 (SUTURE) ×4
SUT VIC AB 0 CT1 27XBRD ANBCTR (SUTURE) ×3 IMPLANT
SUT VIC AB 3-0 SH 27 (SUTURE)
SUT VIC AB 3-0 SH 27XBRD (SUTURE) IMPLANT
SUT VICRYL AB 2 0 TIES (SUTURE) ×4 IMPLANT
SYR BULB 3OZ (MISCELLANEOUS) ×2 IMPLANT
SYR CONTROL 10ML LL (SYRINGE) ×3 IMPLANT
TACKER 5MM HERNIA 3.5CML NAB (ENDOMECHANICALS) ×2 IMPLANT
TOWEL OR 17X24 6PK STRL BLUE (TOWEL DISPOSABLE) ×4 IMPLANT
TOWEL OR 17X26 10 PK STRL BLUE (TOWEL DISPOSABLE) ×4 IMPLANT
TRAY FOLEY CATH 16FRSI W/METER (SET/KITS/TRAYS/PACK) ×2 IMPLANT
TRAY LAPAROSCOPIC (CUSTOM PROCEDURE TRAY) ×4 IMPLANT
TROCAR XCEL BLUNT TIP 100MML (ENDOMECHANICALS) ×4 IMPLANT
TROCAR XCEL NON-BLD 5MMX100MML (ENDOMECHANICALS) ×8 IMPLANT
TUBE CONNECTING 12X1/4 (SUCTIONS) IMPLANT
TUBING INSUFFLATION (TUBING) ×4 IMPLANT
YANKAUER SUCT BULB TIP NO VENT (SUCTIONS) IMPLANT

## 2014-05-09 NOTE — Progress Notes (Signed)
Patient ID: Douglas [name], male   DOB: Jul 10, 1945, 69 y.o.   MRN: [id number] Admit overnight with home CPAP per anesthesia recs. Georganna Skeans, MD, MPH, FACS Trauma: [phone] General Surgery: [phone]

## 2014-05-09 NOTE — Transfer of Care (Signed)
Immediate Anesthesia Transfer of Care Note  Patient: Douglas [name]  Procedure(s) Performed: Procedure(s) with comments: LAPAROSCOPIC BILATERAL INGUINAL HERNIA REPAIR (Bilateral) HERNIA REPAIR UMBILICAL ADULT (N/A) INSERTION OF MESH (N/A) - INSERTION OF MESH TO ABDOMEN AND BILATERAL GROIN  Patient Location: PACU  Anesthesia Type:General  Level of Consciousness: awake and alert   Airway & Oxygen Therapy: Patient Spontanous Breathing and Patient connected to nasal cannula oxygen  Post-op Assessment: Report given to PACU RN, Post -op Vital signs reviewed and stable and Patient moving all extremities X 4  Post vital signs: Reviewed and stable  Complications: No apparent anesthesia complications

## 2014-05-09 NOTE — Anesthesia Procedure Notes (Signed)
Procedure Name: Intubation Date/Time: 05/09/2014 7:32 AM Performed by: Carola Frost Pre-anesthesia Checklist: Patient identified, Timeout performed, Emergency Drugs available, Suction available and Patient being monitored Patient Re-evaluated:Patient Re-evaluated prior to inductionOxygen Delivery Method: Circle system utilized Preoxygenation: Pre-oxygenation with 100% oxygen Intubation Type: IV induction and Cricoid Pressure applied Ventilation: Mask ventilation without difficulty and Oral airway inserted - appropriate to patient size Laryngoscope Size: Mac and 4 Grade View: Grade III Tube type: Oral Tube size: 7.5 mm Number of attempts: 1 Airway Equipment and Method: Stylet Placement Confirmation: CO2 detector,  positive ETCO2,  breath sounds checked- equal and bilateral and ETT inserted through vocal cords under direct vision Secured at: 23 cm Tube secured with: Tape Dental Injury: Teeth and Oropharynx as per pre-operative assessment

## 2014-05-09 NOTE — Op Note (Signed)
05/09/2014  10:34 AM  PATIENT:  Douglas [name]  69 y.o. male  Patient Care Team: [name], MD as PCP - General (Family Medicine)  PRE-OPERATIVE DIAGNOSIS:  BILATERAL INGUINAL HERNIA AND INCARCERATED UMBILICAL HERNIA  POST-OPERATIVE DIAGNOSIS:    BILATERAL INGUINAL HERNIA  BILATERAL FEMORAL HERNIA NCARCERATED UMBILICAL HERNIA  PROCEDURE:  Procedure(s):  LAPAROSCOPIC BILATERAL INGUINAL HERNIA REPAIRS LAPAROSCOPIC BILATERAL FEMORAL HERNIA REPAIRS LAPAROSCOPIC UMBILICAL VENTRAL WALL HERNIA REPAIR INSERTION OF MESH  SURGEON:  Surgeon(s): [name], MD  ASSISTANT: RN   ANESTHESIA:   local and general  EBL:  Total I/O In: 1200 [I.V.:1200] Out: 475 [Urine:425; Blood:50]  Delay start of Pharmacological VTE agent (>24hrs) due to surgical blood loss or risk of bleeding:  no  DRAINS: none   SPECIMEN:  No Specimen  DISPOSITION OF SPECIMEN:  N/A  COUNTS:  YES  PLAN OF CARE: Discharge to home after PACU  PATIENT DISPOSITION:  PACU - hemodynamically stable.  INDICATION: Pleasant obese gentleman with symptomatically incarcerated umbilical hernia.  Found to have bilateral hernias as well.  I recommended laparoscopic exploration and repair with mesh.  The anatomy & physiology of the abdominal wall and pelvic floor was discussed.  The pathophysiology of hernias in the inguinal and pelvic region was discussed.  Natural history risks such as progressive enlargement, pain, incarceration & strangulation was discussed.   Contributors to complications such as smoking, obesity, diabetes, prior surgery, etc were discussed.    I feel the risks of no intervention will lead to serious problems that outweigh the operative risks; therefore, I recommended surgery to reduce and repair the hernia.  I explained laparoscopic techniques with possible need for an open approach.  I noted usual use of mesh to patch and/or buttress hernia repair  Risks such as bleeding, infection, abscess,  need for further treatment, heart attack, death, and other risks were discussed.  I noted a good likelihood this will help address the problem.   Goals of post-operative recovery were discussed as well.  Possibility that this will not correct all symptoms was explained.  I stressed the importance of low-impact activity, aggressive pain control, avoiding constipation, & not pushing through pain to minimize risk of post-operative chronic pain or injury. Possibility of reherniation was discussed.  We will work to minimize complications.     An educational handout further explaining the pathology & treatment options was given as well.  Questions were answered.  The patient expresses understanding & wishes to proceed with surgery.  OR FINDINGS: Bilateral indirect and femoral hernias.  Right greater than left.  3x3 cm incarcerated umbilical hernia  with omentum and preperitoneal fat.    DESCRIPTION:   The patient was identified & brought into the operating room. The patient was positioned supine with arms tucked. SCDs were active during the entire case. The patient underwent general anesthesia without any difficulty.  The abdomen was prepped and draped in a sterile fashion. The patient's bladder was emptied.  A Surgical Timeout confirmed our plan.  With the patient asleep, loss finally able to reduce the periumbilical hernia.  3 x 3 cm fascial defect.  I made a transverse incision through the inferior umbilical fold.  I made a small transverse nick through the anterior rectus fascia contralateral to the inguinal hernia side and placed a 0-vicryl stitch through the fascia.  I placed a Hasson trocar into the preperitoneal plane.  Entry was clean.  We induced carbon dioxide insufflation. Camera inspection revealed no injury.  I used a  28mm angled scope to bluntly free the peritoneum off the infraumbilical anterior abdominal wall.  I created enough of a preperitoneal pocket to place 80mm ports into the right & left  mid-abdomen into this preperitoneal cavity.  I focused attention on the right side since that was the dominant hernia side.   I used blunt & focused sharp dissection to free the peritoneum off the flank and down to the pubic rim.  I freed the anteriolateral bladder wall off the anteriolateral pelvic wall, sparing midline attachments.   I located a swath of peritoneum going into a hernia fascial defect at the internal consistent with an indirect hernia.  I gradually freed the peritoneal hernia sac off safely and reduced it into the preperitoneal space.  I freed the peritoneum off the spermatic vessels & vas deferens.  I freed peritoneum off the retroperitoneum along the psoas muscle.    I checked & assured hemostasis.   patient had prominent spermatic cord lipomas.  These were reduced and skeletonized.  They were later removed through the midline son port.  Patient also had enlarged fatty iliac fat and lymph nodes.  I skeletonized and help reduce some at least define a moderate size femoral hernia as well.  I assured hemostasis.    I turned attention on the opposite side.  I did dissection in a similar, mirror-image fashion. The patient had a smaller but definite indirect hernia.   patient had less prominent iliac fat and lymph nodes but still had a definite femoral hernias well.  There is no evidence of direct space hernias.  No obturator hernias.    I chose 15x15 cm sheets of ultra-lightweight polypropylene mesh (Ultrapro), one for each side.  I cut a single sigmoid-shaped slit ~6cm from a corner of each mesh.  I placed the meshes into the preperitoneal space & laid them as overlapping diamonds such that at the inferior points, a 6x6 cm corner flap rested in the true anterolateral pelvis, covering the obturator & femoral foramina.   I allowed the bladder to fall back and help tuck the corners of the mesh in.  The medial corners overlapped each other across midline cephalad to the pubic rim.   This provided >2  inch coverage around the hernia.  Because the defects well covered and not particularly large, I did not need tacks to hold the mesh in place  I held the hernia sacs cephalad & evacuated carbon dioxide.     I then redirected 5 [place] into the peritoneal cavity.  I removed the infraumbilical son port.  I placed 5 [place]'s and the left upper quadrant and left lower quadrant as well.  I excised the preperitoneal fat and peritoneum this exposed the periumbilical ventral hernia.  Moderate diastases recti as well.  Cannot see any hernias.  Because of his obesity and diastases, chose a 20 x 15 cm dual sided mesh.  I secured #1 Prolene sutures around the edge 10.  Rolled the abdomen.  I secured to the anterior abdominal wall using a laparoscopic suture passer under resuscitation.  This provided at least 5-7 cm of overlap.   I did elapse Reinspection.  No signs of injury or bleeding or other abnormalities.  I used a laparoscopic tacker to tack around the edges and center.  I also tacked the preperitoneum to the infraumbilical part of the mesh.  I did reinspection saw no injury other abnormalities.  Hemostasis was good.    I closed the fascia  With absorbable suture.  I closed the skin using 4-0 monocryl stitch.  Sterile dressings were applied. The patient was extubated & arrived in the PACU in stable condition..  I had discussed postoperative care with the patient in the holding area.   I did discuss operative findings and postoperative goals / instructions to his wife & family as well.  Instructions are written in the chart.  Adin Hector, M.D., F.A.C.S. Gastrointestinal and Minimally Invasive Surgery [facility] [address] [place], [place] [address] [phone] Main / Paging

## 2014-05-09 NOTE — Progress Notes (Signed)
Placed patient on CPAP via FFM , 8.0 cm H20 per patient home settings.  Patient tolerating well at this time.

## 2014-05-09 NOTE — H&P (Signed)
Douglas [name]. [facility]  Location: [facility] Patient #: [id number] DOB: Mar 25, 1946 Married / Language: Douglas [name] / Race: White Male  History of Present Illness Douglas Hector MD; 03/21/2014 3:14 PM) Patient words: umb hernia.  The patient is a 69 year old male who presents with an umbilical hernia. Patient sent to me by Dr. Darlin Coco over concerns of an umbilical hernia.  I had operated on his daughter. He requested me for his surgeon. Patient with hernia at his belly button for over a decade. More recently he thinks it has gotten larger. It used to be reducible. Now stuck. It has increased in size. Some change in coloring as well. Discussed with his cardiologist. Surgical consultation requested. History of myocardial infarction with stents 2007. On chronic Plavix anticoagulation. Can walk several miles without difficulty. No cardiac or stroke events since his heart attack in 2007. Has 2 bowel movements a day. He comes today with his wife. No history of other abdominal surgeries. No fevers chills or sweats. No history of wound infections or MRSA.  No new events since last visit.  Cardiac clearance done   Other Problems Douglas [name], CMA; 03/21/2014 2:36 PM) Gastroesophageal Reflux Disease High blood pressure Hypercholesterolemia Kidney Stone Myocardial infarction Sleep Apnea Umbilical Hernia Repair  Past Surgical History Douglas [name], CMA; 03/21/2014 2:36 PM) Cataract Surgery Left. Colon Polyp Removal - Colonoscopy Shoulder Surgery Right. Tonsillectomy  Diagnostic Studies History Douglas [name], CMA; 03/21/2014 2:36 PM) Colonoscopy within last year  Allergies Douglas [name], [place]; 03/21/2014 2:37 PM) ACE Inhibitors Lisinopril *ANTIHYPERTENSIVES* Losartan Potassium *ANTIHYPERTENSIVES*  Medication History Douglas [name], CMA; 03/21/2014 2:39 PM) Clopidogrel Bisulfate (75MG  Tablet, Oral) Active. Pantoprazole Sodium (40MG   Tablet DR, Oral) Active. Coreg (25MG  Tablet, Oral) Active. Baby Aspirin (81MG  Tablet Chewable, Oral) Active. Simvastatin (20MG  Tablet, Oral) Active. AmLODIPine Besylate (10MG  Tablet, Oral) Active. Cholecalciferol (2000UNIT Capsule, Oral) Active.  Social History Douglas [name], [place]; 03/21/2014 2:36 PM) Alcohol use Remotely quit alcohol use. Caffeine use Coffee. No drug use Tobacco use Former smoker.  Family History Douglas [name], [place]; 03/21/2014 2:36 PM) Alcohol Abuse Brother. Arthritis Daughter, Mother. Colon Cancer Mother. Colon Polyps Mother. Heart Disease Daughter, Father, Mother. Heart disease in male family member before age 58 Seizure disorder Daughter.  Review of Systems Douglas [name] CMA; 03/21/2014 2:36 PM) General Not Present- Appetite Loss, Chills, Fatigue, Fever, Night Sweats, Weight Gain and Weight Loss. Skin Not Present- Change in Wart/Mole, Dryness, Hives, Jaundice, New Lesions, Non-Healing Wounds, Rash and Ulcer. HEENT Not Present- Earache, Hearing Loss, Hoarseness, Nose Bleed, Oral Ulcers, Ringing in the Ears, Seasonal Allergies, Sinus Pain, Sore Throat, Visual Disturbances, Wears glasses/contact lenses and Yellow Eyes. Respiratory Not Present- Bloody sputum, Chronic Cough, Difficulty Breathing, Snoring and Wheezing. Breast Not Present- Breast Mass, Breast Pain, Nipple Discharge and Skin Changes. Cardiovascular Not Present- Chest Pain, Difficulty Breathing Lying Down, Leg Cramps, Palpitations, Rapid Heart Rate, Shortness of Breath and Swelling of Extremities. Gastrointestinal Not Present- Abdominal Pain, Bloating, Bloody Stool, Change in Bowel Habits, Chronic diarrhea, Constipation, Difficulty Swallowing, Excessive gas, Gets full quickly at meals, Hemorrhoids, Indigestion, Nausea, Rectal Pain and Vomiting. Male Genitourinary Not Present- Blood in Urine, Change in Urinary Stream, Frequency, Impotence, Nocturia, Painful Urination, Urgency and Urine  Leakage. Musculoskeletal Not Present- Back Pain, Joint Pain, Joint Stiffness, Muscle Pain, Muscle Weakness and Swelling of Extremities. Neurological Not Present- Decreased Memory, Fainting, Headaches, Numbness, Seizures, Tingling, Tremor, Trouble walking and Weakness. Psychiatric Not Present- Anxiety, Bipolar, Change in Sleep Pattern, Depression, Fearful and Frequent crying. Endocrine Not Present-  Cold Intolerance, Excessive Hunger, Hair Changes, Heat Intolerance, Hot flashes and New Diabetes. Hematology Not Present- Easy Bruising, Excessive bleeding, Gland problems, HIV and Persistent Infections.   Vitals Douglas [name] CMA; 03/21/2014 2:42 PM) 03/21/2014 2:40 PM Weight: 198.25 lb Height: 67in Body Surface Area: 2.06 m Body Mass Index: 31.05 kg/m Temp.: 98.56F  Pulse: 61 (Regular)  BP: 104/72 (Sitting, Left Arm, Standard)    Physical Exam Douglas Hector MD; 03/21/2014 3:09 PM) General Mental Status-Alert. General Appearance-Not in acute distress, Not Sickly. Orientation-Oriented X3. Hydration-Well hydrated. Voice-Normal.  Integumentary Global Assessment Upon inspection and palpation of skin surfaces of the - Axillae: non-tender, no inflammation or ulceration, no drainage. and Distribution of scalp and body hair is normal. General Characteristics Temperature - normal warmth is noted.  Head and Neck Head-normocephalic, atraumatic with no lesions or palpable masses. Face Global Assessment - atraumatic, no absence of expression. Neck Global Assessment - no abnormal movements, no bruit auscultated on the right, no bruit auscultated on the left, no decreased range of motion, non-tender. Trachea-midline. Thyroid Gland Characteristics - non-tender.  Eye Eyeball - Left-Extraocular movements intact, No Nystagmus. Eyeball - Right-Extraocular movements intact, No Nystagmus. Cornea - Left-No Hazy. Cornea - Right-No Hazy. Sclera/Conjunctiva -  Left-No scleral icterus, No Discharge. Sclera/Conjunctiva - Right-No scleral icterus, No Discharge. Pupil - Left-Direct reaction to light normal. Pupil - Right-Direct reaction to light normal.  ENMT Ears Pinna - Left - no drainage observed, no generalized tenderness observed. Right - no drainage observed, no generalized tenderness observed. Nose and Sinuses External Inspection of the Nose - no destructive lesion observed. Inspection of the nares - Left - quiet respiration. Right - quiet respiration. Mouth and Throat Lips - Upper Lip - no fissures observed, no pallor noted. Lower Lip - no fissures observed, no pallor noted. Nasopharynx - no discharge present. Oral Cavity/Oropharynx - Tongue - no dryness observed. Oral Mucosa - no cyanosis observed. Hypopharynx - no evidence of airway distress observed.  Chest and Lung Exam Inspection Movements - Normal and Symmetrical. Accessory muscles - No use of accessory muscles in breathing. Palpation Palpation of the chest reveals - Non-tender. Auscultation Breath sounds - Normal and Clear.  Cardiovascular Auscultation Rhythm - Regular. Murmurs & Other Heart Sounds - Auscultation of the heart reveals - No Murmurs and No Systolic Clicks.  Abdomen Inspection Inspection of the abdomen reveals - No Visible peristalsis and No Abnormal pulsations. Umbilicus - No Bleeding, No Urine drainage. Palpation/Percussion Palpation and Percussion of the abdomen reveal - Soft, Non Tender, No Rebound tenderness, No Rigidity (guarding) and No Cutaneous hyperesthesia. Note: Obese but soft. Obvious umbilical hernia 3 cm. Incarcerated. Soft.   Male Genitourinary Sexual Maturity Tanner 5 - Adult hair pattern and Adult penile size and shape. Note: Normal external male genitalia. Testes normal. Left greater than right inguinal hernias. Reducible.   Peripheral Vascular Upper Extremity Inspection - Left - No Cyanotic nailbeds, Not Ischemic. Right - No  Cyanotic nailbeds, Not Ischemic.  Neurologic Neurologic evaluation reveals -normal attention span and ability to concentrate, able to name objects and repeat phrases. Appropriate fund of knowledge , normal sensation and normal coordination. Mental Status Affect - not angry, not paranoid. Cranial Nerves-Normal Bilaterally. Gait-Normal.  Neuropsychiatric Mental status exam performed with findings of-able to articulate well with normal speech/language, rate, volume and coherence, thought content normal with ability to perform basic computations and apply abstract reasoning and no evidence of hallucinations, delusions, obsessions or homicidal/suicidal ideation.  Musculoskeletal Global Assessment Spine, Ribs and Pelvis - no  instability, subluxation or laxity. Right Upper Extremity - no instability, subluxation or laxity.  Lymphatic Head & Neck  General Head & Neck Lymphatics: Bilateral - Description - No Localized lymphadenopathy. Axillary  General Axillary Region: Bilateral - Description - No Localized lymphadenopathy. Femoral & Inguinal  Generalized Femoral & Inguinal Lymphatics: Left - Description - No Localized lymphadenopathy. Right - Description - No Localized lymphadenopathy.    Assessment & Plan Douglas Hector MD; 29/08/1882 1:66 PM) UMBILICAL HERNIA, INCARCERATED (552.1  K42.0) Impression: I think he would benefit from surgical repair since it is becoming larger, is now incarcerated, has become symptomatic. Given his obesity recommend mesh underlay repair. Laparoscopic approach. I'd recommend bilateral inguinal hernia repairs as well.  I would need cardiac clearance to make sure it is safe to come off Plavix. I'm assuming that its OK since his cardiologist was the one that sent the patient to me for surgical evaluation, but just double-check  Current Plans  Schedule for Surgery [facility] Consent - Hernia Repair - Ventral/Incisional/Umbilical (Tarig Zimmers) BILATERAL INGUINAL  HERNIA WITHOUT OBSTRUCTION OR GANGRENE, RECURRENCE NOT SPECIFIED (550.92  K40.20)  Impression: They are not large, but risk of incarceration is sized with inguinal hernias. I would recommend repair of these at the same time. He seemed hesitant at first but I did caution them that there is hernias will get worse once the umbilical hernia is repaired.   Laparoscopic preperitoneal approach. Increased bleeding risk on Plavix but we'll try to be diligent. Cardiac clearance done.   Current Plans [facility] Consent - Hernia Repair Inguinal/Pelvic (Mamadou Breon): discussed with patient and provided information. Schedule for Surgery Pt Education - [facility] Hernia Post-Op HCI (Aijalon Demuro): discussed with patient and provided information. Pt Education - [facility] Pain Control (Jashan Cotten) Pt Education - [facility] Good Bowel Health (Loretto Belinsky)  Douglas [name], M.D., F.A.C.S. Gastrointestinal and Minimally Invasive Surgery [facility] [address] [place], [place] [address] [phone] Main / Paging

## 2014-05-09 NOTE — Discharge Instructions (Signed)
HERNIA REPAIR: POST OP INSTRUCTIONS  1. DIET: Follow a light bland diet the first 24 hours after arrival home, such as soup, liquids, crackers, etc.  Be sure to include lots of fluids daily.  Avoid fast food or heavy meals as your are more likely to get nauseated.  Eat a low fat the next few days after surgery. 2. Take your usually prescribed home medications unless otherwise directed. 3. PAIN CONTROL: a. Pain is best controlled by a usual combination of three different methods TOGETHER: i. Ice/Heat ii. Over the counter pain medication iii. Prescription pain medication b. Most patients will experience some swelling and bruising around the hernia(s) such as the bellybutton, groins, or old incisions.  Ice packs or heating pads (30-60 minutes up to 6 times a day) will help. Use ice for the first few days to help decrease swelling and bruising, then switch to heat to help relax tight/sore spots and speed recovery.  Some people prefer to use ice alone, heat alone, alternating between ice & heat.  Experiment to what works for you.  Swelling and bruising can take several weeks to resolve.   c. It is helpful to take an over-the-counter pain medication regularly for the first few weeks.  Choose Acetaminophen (Tylenol, etc) 325-650mg  four times a day (every meal & bedtime) d. A  prescription for pain medication should be given to you upon discharge.  Take your pain medication as prescribed.  i. If you are having problems/concerns with the prescription medicine (does not control pain, nausea, vomiting, rash, itching, etc), please call us [phone] to see if we need to switch you to a different pain medicine that will work better for you and/or control your side effect better. ii. If you need a refill on your pain medication, please contact your pharmacy.  They will contact our office to request authorization. Prescriptions will not be filled after 5 pm or on week-ends. 4. Avoid getting constipated.  Between  the surgery and the pain medications, it is common to experience some constipation.  Increasing fluid intake and taking a fiber supplement (such as Metamucil, Citrucel, FiberCon, MiraLax, etc) 1-2 times a day regularly will usually help prevent this problem from occurring.  A mild laxative (prune juice, Milk of Magnesia, MiraLax, etc) should be taken according to package directions if there are no bowel movements after 48 hours.   5. Wash / shower every day.  You may shower over the dressings as they are waterproof.   6. Remove your waterproof bandages 5 days after surgery.  You may leave the incision open to air.  You may replace a dressing/Band-Aid to cover the incision for comfort if you wish.  Continue to shower over incision(s) after the dressing is off.    7. ACTIVITIES as tolerated:   a. You may resume regular (light) daily activities beginning the next day--such as daily self-care, walking, climbing stairs--gradually increasing activities as tolerated.  If you can walk 30 minutes without difficulty, it is safe to try more intense activity such as jogging, treadmill, bicycling, low-impact aerobics, swimming, etc. b. Save the most intensive and strenuous activity for last such as sit-ups, heavy lifting, contact sports, etc  Refrain from any heavy lifting or straining until you are off narcotics for pain control.   c. DO NOT PUSH THROUGH PAIN.  Let pain be your guide: If it hurts to do something, don't do it.  Pain is your body warning you to avoid that activity for another week until  the pain goes down. d. You may drive when you are no longer taking prescription pain medication, you can comfortably wear a seatbelt, and you can safely maneuver your car and apply brakes. e. Dennis Bast may have sexual intercourse when it is comfortable.  8. FOLLOW UP in our office a. Please call [facility] at [phone] to set up an appointment to see your surgeon in the office for a follow-up appointment approximately 2-3  weeks after your surgery. b. Make sure that you call for this appointment the day you arrive home to insure a convenient appointment time. 9.  IF YOU HAVE DISABILITY OR FAMILY LEAVE FORMS, BRING THEM TO THE OFFICE FOR PROCESSING.  DO NOT GIVE THEM TO YOUR DOCTOR.  WHEN TO CALL us [phone]: 1. Poor pain control 2. Reactions / problems with new medications (rash/itching, nausea, etc)  3. Fever over 101.5 F (38.5 C) 4. Inability to urinate 5. Nausea and/or vomiting 6. Worsening swelling or bruising 7. Continued bleeding from incision. 8. Increased pain, redness, or drainage from the incision   The clinic staff is available to answer your questions during regular business hours (8:30am-5pm).  Please dont hesitate to call and ask to speak to one of our nurses for clinical concerns.   If you have a medical emergency, go to the nearest emergency room or call 911.  A surgeon from [facility] is always on call at the hospitals in [facility], [place], [place], [place], [place]  [address] ?  [address] [address], [place], [place]   [address] MAIN: [phone] ? TOLL FREE: [phone] ? FAX: [phone] [email or web address]  Managing Pain  Pain after surgery or related to activity is often due to strain/injury to muscle, tendon, nerves and/or incisions.  This pain is usually short-term and will improve in a few months.   Many people find it helpful to do the following things TOGETHER to help speed the process of healing and to get back to regular activity more quickly:  1. Avoid heavy physical activity a.  no lifting greater than 20 pounds b. Do not push through the pain.  Listen to your body and avoid positions and maneuvers than reproduce the pain c. Walking is okay as tolerated, but go slowly and stop when getting sore.  d. Remember: If it hurts to do it, then dont do it! 2. Take Anti-inflammatory medication  a. Take  with food/snack around the clock for 1-2 weeks i. This helps the muscle and nerve tissues become less irritable and calm down faster b. Choose ONE of the following over-the-counter medications: i. Naproxen 220mg  tabs (ex. Aleve) 1-2 pills twice a day  ii. Ibuprofen 200mg  tabs (ex. Advil, Motrin) 3-4 pills with every meal and just before bedtime iii. Acetaminophen 500mg  tabs (Tylenol) 1-2 pills with every meal and just before bedtime 3. Use a Heating pad or Ice/Cold Pack a. 4-6 times a day b. May use warm bath/hottub  or showers 4. Try Gentle Massage and/or Stretching  a. at the area of pain many times a day b. stop if you feel pain - do not overdo it  Try these steps together to help you body heal faster and avoid making things get worse.  Doing just one of these things may not be enough.    If you are not getting better after two weeks or are noticing you are getting worse, contact our office for further advice; we may need to re-evaluate you &  see what other things we can do to help.  GETTING TO GOOD BOWEL HEALTH. Irregular bowel habits such as constipation and diarrhea can lead to many problems over time.  Having one soft bowel movement a day is the most important way to prevent further problems.  The anorectal canal is designed to handle stretching and feces to safely manage our ability to get rid of solid waste (feces, poop, stool) out of our body.  BUT, hard constipated stools can act like ripping concrete bricks and diarrhea can be a burning fire to this very sensitive area of our body, causing inflamed hemorrhoids, anal fissures, increasing risk is perirectal abscesses, abdominal pain/bloating, an making irritable bowel worse.     The goal: ONE SOFT BOWEL MOVEMENT A DAY!  To have soft, regular bowel movements:   Drink at least 8 tall glasses of water a day.    Take plenty of fiber.  Fiber is the undigested part of plant food that passes into the colon, acting s natures broom to  encourage bowel motility and movement.  Fiber can absorb and hold large amounts of water. This results in a larger, bulkier stool, which is soft and easier to pass. Work gradually over several weeks up to 6 servings a day of fiber (25g a day even more if needed) in the form of: o Vegetables -- Root (potatoes, carrots, turnips), leafy green (lettuce, salad greens, celery, spinach), or cooked high residue (cabbage, broccoli, etc) o Fruit -- Fresh (unpeeled skin & pulp), Dried (prunes, apricots, cherries, etc ),  or stewed ( applesauce)  o Whole grain breads, pasta, etc (whole wheat)  o Bran cereals   Bulking Agents -- This type of water-retaining fiber generally is easily obtained each day by one of the following:  o Psyllium bran -- The psyllium plant is remarkable because its ground seeds can retain so much water. This product is available as Metamucil, Konsyl, Effersyllium, Per Diem Fiber, or the less expensive generic preparation in drug and health food stores. Although labeled a laxative, it really is not a laxative.  o Methylcellulose -- This is another fiber derived from wood which also retains water. It is available as Citrucel. o Polyethylene Glycol - and artificial fiber commonly called Miralax or Glycolax.  It is helpful for people with gassy or bloated feelings with regular fiber o Flax Seed - a less gassy fiber than psyllium  No reading or other relaxing activity while on the toilet. If bowel movements take longer than 5 minutes, you are too constipated  AVOID CONSTIPATION.  High fiber and water intake usually takes care of this.  Sometimes a laxative is needed to stimulate more frequent bowel movements, but   Laxatives are not a good long-term solution as it can wear the colon out. o Osmotics (Milk of Magnesia, Fleets phosphosoda, Magnesium citrate, MiraLax, GoLytely) are safer than  o Stimulants (Senokot, Castor Oil, Dulcolax, Ex Lax)    o Do not take laxatives for more than 7days  in a row.   IF SEVERELY CONSTIPATED, try a Bowel Retraining Program: o Do not use laxatives.  o Eat a diet high in roughage, such as bran cereals and leafy vegetables.  o Drink six (6) ounces of prune or apricot juice each morning.  o Eat two (2) large servings of stewed fruit each day.  o Take one (1) heaping tablespoon of a psyllium-based bulking agent twice a day. Use sugar-free sweetener when possible to avoid excessive calories.  o Eat a  normal breakfast.  o Set aside 15 minutes after breakfast to sit on the toilet, but do not strain to have a bowel movement.  o If you do not have a bowel movement by the third day, use an enema and repeat the above steps.   Controlling diarrhea o Switch to liquids and simpler foods for a few days to avoid stressing your intestines further. o Avoid dairy products (especially milk & ice cream) for a short time.  The intestines often can lose the ability to digest lactose when stressed. o Avoid foods that cause gassiness or bloating.  Typical foods include beans and other legumes, cabbage, broccoli, and dairy foods.  Every person has some sensitivity to other foods, so listen to our body and avoid those foods that trigger problems for you. o Adding fiber (Citrucel, Metamucil, psyllium, Miralax) gradually can help thicken stools by absorbing excess fluid and retrain the intestines to act more normally.  Slowly increase the dose over a few weeks.  Too much fiber too soon can backfire and cause cramping & bloating. o Probiotics (such as active yogurt, Align, etc) may help repopulate the intestines and colon with normal bacteria and calm down a sensitive digestive tract.  Most studies show it to be of mild help, though, and such products can be costly. o Medicines: - Bismuth subsalicylate (ex. Kayopectate, Pepto Bismol) every 30 minutes for up to 6 doses can help control diarrhea.  Avoid if pregnant. - Loperamide (Immodium) can slow down diarrhea.  Start with two  tablets (4mg  total) first and then try one tablet every 6 hours.  Avoid if you are having fevers or severe pain.  If you are not better or start feeling worse, stop all medicines and call your doctor for advice o Call your doctor if you are getting worse or not better.  Sometimes further testing (cultures, endoscopy, X-ray studies, bloodwork, etc) may be needed to help diagnose and treat the cause of the diarrhea. o  What to eat:  For your first meals, you should eat lightly; only small meals initially.  If you do not have nausea, you may eat larger meals.  Avoid spicy, greasy and heavy food.    General Anesthesia, Adult, Care After  Refer to this sheet in the next few weeks. These instructions provide you with information on caring for yourself after your procedure. Your health care provider may also give you more specific instructions. Your treatment has been planned according to current medical practices, but problems sometimes occur. Call your health care provider if you have any problems or questions after your procedure.  WHAT TO EXPECT AFTER THE PROCEDURE  After the procedure, it is typical to experience:  Sleepiness.  Nausea and vomiting. HOME CARE INSTRUCTIONS  For the first 24 hours after general anesthesia:  Have a responsible person with you.  Do not drive a car. If you are alone, do not take public transportation.  Do not drink alcohol.  Do not take medicine that has not been prescribed by your health care provider.  Do not sign important papers or make important decisions.  You may resume a normal diet and activities as directed by your health care provider.  Change bandages (dressings) as directed.  If you have questions or problems that seem related to general anesthesia, call the hospital and ask for the anesthetist or anesthesiologist on call. SEEK MEDICAL CARE IF:  You have nausea and vomiting that continue the day after anesthesia.  You develop a rash.  SEEK IMMEDIATE  MEDICAL CARE IF:  You have difficulty breathing.  You have chest pain.  You have any allergic problems. Document Released: 07/14/2000 Document Revised: 12/08/2012 Document Reviewed: 10/21/2012  [facility] Patient Information 2014 [place], [place].

## 2014-05-09 NOTE — Anesthesia Preprocedure Evaluation (Addendum)
Anesthesia Evaluation  Patient identified by MRN, date of birth, ID band Patient awake    Reviewed: Allergy & Precautions, NPO status , Patient's Chart, lab work & pertinent test results, reviewed documented beta blocker date and time   Airway Mallampati: I  TM Distance: >3 FB Neck ROM: Full    Dental  (+) Dental Advisory Given   Pulmonary sleep apnea , former smoker,          Cardiovascular hypertension, Pt. on medications and Pt. on home beta blockers + CAD, + Past MI and + Cardiac Stents  Echo (10/2012) - - Left ventricle: The cavity size was normal. There was mild concentric hypertrophy. Systolic function was normal. The estimated ejection fraction was in the range of 55% to60%. Mild hypokinesis of the anteroapical wall. - Aortic valve: Mild regurgitation. - Left atrium: The atrium was mildly dilated   Neuro/Psych    GI/Hepatic   Endo/Other    Renal/GU      Musculoskeletal  (+) Arthritis -,   Abdominal   Peds  Hematology   Anesthesia Other Findings   Reproductive/Obstetrics                           Anesthesia Physical Anesthesia Plan  ASA: III  Anesthesia Plan: General   Post-op Pain Management:    Induction: Intravenous  Airway Management Planned: Oral ETT  Additional Equipment:   Intra-op Plan:   Post-operative Plan: Extubation in OR  Informed Consent: I have reviewed the patients History and Physical, chart, labs and discussed the procedure including the risks, benefits and alternatives for the proposed anesthesia with the patient or authorized representative who has indicated his/her understanding and acceptance.   Dental advisory given  Plan Discussed with: Surgeon and CRNA  Anesthesia Plan Comments:        Anesthesia Quick Evaluation

## 2014-05-09 NOTE — Anesthesia Postprocedure Evaluation (Signed)
Anesthesia Post Note  Patient: Douglas [name]  Procedure(s) Performed: Procedure(s) (LRB): LAPAROSCOPIC BILATERAL INGUINAL HERNIA REPAIR (Bilateral) HERNIA REPAIR UMBILICAL ADULT (N/A) INSERTION OF MESH (N/A)  Anesthesia type: general  Patient location: PACU  Post pain: Pain level controlled  Post assessment: Patient's Cardiovascular Status Stable  Last Vitals:  Filed Vitals:   05/09/14 1115  BP: 123/78  Pulse: 57  Temp:   Resp: 20    Post vital signs: Reviewed and stable  Level of consciousness: sedated  Complications: No apparent anesthesia complications

## 2014-05-10 ENCOUNTER — Encounter (HOSPITAL_COMMUNITY): Payer: Self-pay | Admitting: Surgery

## 2014-05-10 DIAGNOSIS — K4020 Bilateral inguinal hernia, without obstruction or gangrene, not specified as recurrent: Secondary | ICD-10-CM | POA: Diagnosis not present

## 2014-05-10 MED ORDER — ACETAMINOPHEN 500 MG PO TABS
1000.0000 mg | ORAL_TABLET | Freq: Three times a day (TID) | ORAL | Status: DC
  Administered 2014-05-10: 1000 mg via ORAL
  Filled 2014-05-10: qty 2

## 2014-05-10 NOTE — Progress Notes (Signed)
Patient discharged to home with instructions. 
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
## 2014-05-15 ENCOUNTER — Other Ambulatory Visit: Payer: Self-pay | Admitting: *Deleted

## 2014-05-15 MED ORDER — FUROSEMIDE 20 MG PO TABS: 20.0000 mg | ORAL_TABLET | Freq: Every day | ORAL | Status: DC | PRN

## 2014-07-05 ENCOUNTER — Other Ambulatory Visit (INDEPENDENT_AMBULATORY_CARE_PROVIDER_SITE_OTHER): Payer: Medicare Other | Admitting: *Deleted

## 2014-07-05 DIAGNOSIS — I119 Hypertensive heart disease without heart failure: Secondary | ICD-10-CM

## 2014-07-05 LAB — HEPATIC FUNCTION PANEL
ALT: 13 U/L (ref 0–53)
AST: 14 U/L (ref 0–37)
Albumin: 4 g/dL (ref 3.5–5.2)
Alkaline Phosphatase: 65 U/L (ref 39–117)
Bilirubin, Direct: 0.1 mg/dL (ref 0.0–0.3)
Total Bilirubin: 0.5 mg/dL (ref 0.2–1.2)
Total Protein: 6.6 g/dL (ref 6.0–8.3)

## 2014-07-05 LAB — BASIC METABOLIC PANEL
BUN: 27 mg/dL — ABNORMAL HIGH (ref 6–23)
CO2: 27 mEq/L (ref 19–32)
Calcium: 9.1 mg/dL (ref 8.4–10.5)
Chloride: 105 mEq/L (ref 96–112)
Creatinine, Ser: 0.95 mg/dL (ref 0.40–1.50)
GFR: 83.48 mL/min (ref >60.00)
Glucose, Bld: 109 mg/dL — ABNORMAL HIGH (ref 70–99)
Potassium: 3.8 mEq/L (ref 3.5–5.1)
Sodium: 138 mEq/L (ref 135–145)

## 2014-07-05 LAB — CBC WITH DIFFERENTIAL/PLATELET
Basophils Absolute: 0 10*3/uL (ref 0.0–0.1)
Basophils Relative: 0.9 % (ref 0.0–3.0)
Eosinophils Absolute: 0.3 10*3/uL (ref 0.0–0.7)
Eosinophils Relative: 5.2 % — ABNORMAL HIGH (ref 0.0–5.0)
HCT: 44.1 % (ref 39.0–52.0)
Hemoglobin: 14.9 g/dL (ref 13.0–17.0)
Lymphocytes Relative: 14.7 % (ref 12.0–46.0)
Lymphs Abs: 0.8 10*3/uL (ref 0.7–4.0)
MCHC: 33.8 g/dL (ref 30.0–36.0)
MCV: 87.5 fl (ref 78.0–100.0)
Monocytes Absolute: 0.6 10*3/uL (ref 0.1–1.0)
Monocytes Relative: 11.3 % (ref 3.0–12.0)
Neutro Abs: 3.7 10*3/uL (ref 1.4–7.7)
Neutrophils Relative %: 67.9 % (ref 43.0–77.0)
Platelets: 176 10*3/uL (ref 150.0–400.0)
RBC: 5.04 Mil/uL (ref 4.22–5.81)
RDW: 15.4 % (ref 11.5–15.5)
WBC: 5.4 10*3/uL (ref 4.0–10.5)

## 2014-07-05 LAB — LIPID PANEL
Cholesterol: 141 mg/dL (ref 0–200)
HDL: 35.9 mg/dL — ABNORMAL LOW (ref >39.00)
LDL Cholesterol: 81 mg/dL (ref 0–99)
NonHDL: 105.1
Total CHOL/HDL Ratio: 4
Triglycerides: 123 mg/dL (ref 0.0–149.0)
VLDL: 24.6 mg/dL (ref 0.0–40.0)

## 2014-07-05 NOTE — Progress Notes (Signed)
Quick Note:  Please report to patient. The recent labs are stable. Continue same medication and careful diet. BUN higher. Drink more water ______

## 2014-07-05 NOTE — Addendum Note (Signed)
Addended by: Eulis Foster on: 07/05/2014 08:29 AM   Modules accepted: Orders

## 2014-07-10 ENCOUNTER — Other Ambulatory Visit: Payer: Self-pay | Admitting: Cardiology

## 2014-07-12 ENCOUNTER — Ambulatory Visit: Payer: Medicare Other | Admitting: Cardiology

## 2014-07-31 ENCOUNTER — Ambulatory Visit (INDEPENDENT_AMBULATORY_CARE_PROVIDER_SITE_OTHER): Payer: Medicare Other | Admitting: Physician Assistant

## 2014-07-31 ENCOUNTER — Encounter: Payer: Self-pay | Admitting: Physician Assistant

## 2014-07-31 VITALS — BP 124/80 | HR 56 | Ht 67.0 in | Wt 204.4 lb

## 2014-07-31 DIAGNOSIS — E785 Hyperlipidemia, unspecified: Secondary | ICD-10-CM

## 2014-07-31 DIAGNOSIS — I259 Chronic ischemic heart disease, unspecified: Secondary | ICD-10-CM | POA: Diagnosis not present

## 2014-07-31 DIAGNOSIS — I119 Hypertensive heart disease without heart failure: Secondary | ICD-10-CM
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
## 2014-07-31 NOTE — Patient Instructions (Addendum)
Medication Instructions:  No changes  Labwork: Will talk with Rip Harbour  Testing/Procedures: none  Follow-Up: 4 months with Dr. Domenic Polite  Any Other Special Instructions Will Be Listed Below (If Applicable).

## 2014-07-31 NOTE — Assessment & Plan Note (Signed)
Blood pressure controlled. 

## 2014-07-31 NOTE — Assessment & Plan Note (Signed)
Patient is stable without symptoms of chest pain. Continue Plavix and aspirin.

## 2014-07-31 NOTE — Assessment & Plan Note (Signed)
Patient is on Zocor. Most recent fasting lipid panel stable.

## 2014-08-01 ENCOUNTER — Telehealth: Payer: Self-pay | Admitting: *Deleted

## 2014-08-01 ENCOUNTER — Other Ambulatory Visit: Payer: Self-pay | Admitting: *Deleted

## 2014-08-01 DIAGNOSIS — I259 Chronic ischemic heart disease, unspecified: Secondary | ICD-10-CM

## 2014-08-01 DIAGNOSIS — I119 Hypertensive heart disease without heart failure: Secondary | ICD-10-CM

## 2014-08-01 NOTE — Telephone Encounter (Signed)
S/w pt to let pt know I put lab orders in per [name] and scheduled lab appointment for a couple days prior to ov appt,

## 2014-08-14 ENCOUNTER — Other Ambulatory Visit: Payer: Self-pay | Admitting: Cardiology

## 2014-08-18 ENCOUNTER — Encounter: Payer: Self-pay | Admitting: Internal Medicine

## 2014-08-18 ENCOUNTER — Ambulatory Visit (INDEPENDENT_AMBULATORY_CARE_PROVIDER_SITE_OTHER): Payer: Medicare Other | Admitting: Internal Medicine

## 2014-08-18 VITALS — BP 98/68 | HR 52 | Ht 67.0 in | Wt 203.2 lb

## 2014-08-18 DIAGNOSIS — G4733 Obstructive sleep apnea (adult) (pediatric): Secondary | ICD-10-CM

## 2014-08-18 NOTE — Patient Instructions (Signed)
You are doing great with your CPAP set at 12/ Advanced. We don't need to make any changes  Please call if we can help.

## 2014-08-18 NOTE — Progress Notes (Signed)
02/15/13- 80 yoM former smoker referred courtesy of Dr Sharyon Medicus study attached Wife here Chronic complaints of daytime sleepiness loud snoring and witnessed apneas. He denies problems driving and uses little caffeine. Bedtime 10 and 11 PM, sleep latency 10 minutes, waking 2-3 times for bathroom before up between 6 and 7 AM. ENT surgery limited to tonsils. Medical history of myocardial infarction, hypertension without lung disease. NPSG 01/18/13 [facility]  22/hr, moderate OSA, weight  195 lbs  04/18/13- 67 yoM former smoker followed for OSA, complicated by CAD, HBP Wearing CPAP Auto/ Advanced for 8 hours per night.  Discuss new face mask Download showed inadequate use, but patient and her husband say the CPAP machine modem signal is poor and often interrupted. There is also very poor cell phone reception at their home. He assures me he is using CPAP. Wife confirms he uses it. It stops his snoring.  08/17/13- 67 yoM former smoker followed for OSA, complicated by CAD, HBP CPAP 12/ [facility] all night every night Download confirms excellent compliance.  He is "used to CPAP now" and comfortable. Wife confirms.  08/18/14- 67 yoM former smoker followed for OSA, complicated by CAD, HBP CPAP 12/ [facility]  FOLLOWS FOR: Wears CPAP every night for about 6-7 hours; DME is [facility]. No new supplies needed at this time.    ROS-see HPI Constitutional:   No-   weight loss, night sweats, fevers, chills, fatigue, lassitude. HEENT:   No-  headaches, difficulty swallowing, tooth/dental problems, sore throat,       No-  sneezing, itching, ear ache, nasal congestion, post nasal drip,  CV:  No-   chest pain, orthopnea, PND, swelling in lower extremities, anasarca, dizziness, palpitations Resp: No-   shortness of breath with exertion or at rest.              No-   productive cough,  No non-productive cough,  No- coughing up of blood.              No-   change in color of mucus.  No- wheezing.   Skin: No-   rash or lesions. GI:   No-   heartburn, indigestion, abdominal pain, nausea, vomiting,  GU:  MS:  No-   joint pain or swelling.   Neuro-     nothing unusual Psych:  No- change in mood or affect. No depression or anxiety.  No memory loss.  OBJ- Physical Exam General- Alert, Oriented, Affect-appropriate, Distress- none acute. Overweight Skin- rash-none, lesions- none, excoriation- none Lymphadenopathy- none Head- atraumatic            Eyes- Gross vision intact, PERRLA, conjunctivae and secretions clear            Ears- Hearing, canals-normal            Nose- Clear, no-Septal dev, mucus, polyps, erosion, perforation             Throat- Mallampati II-III , mucosa clear , drainage- none, tonsils- atrophic Neck- flexible , trachea midline, no stridor , thyroid nl, carotid no bruit Chest - symmetrical excursion , unlabored           Heart/CV- RRR , no murmur , no gallop  , no rub, nl s1 s2                           - JVD- none , edema- none, stasis changes- none, varices- none  Lung- clear to P&A, wheeze- none, cough- none , dullness-none, rub- none           Chest wall-  Abd-  Br/ Gen/ Rectal- Not done, not indicated Extrem- cyanosis- none, clubbing, none, atrophy- none, strength- nl Neuro- grossly intact to observation

## 2014-09-27 ENCOUNTER — Telehealth: Payer: Self-pay | Admitting: *Deleted

## 2014-09-27 MED ORDER — AZITHROMYCIN 250 MG PO TABS: ORAL_TABLET | ORAL | Status: DC

## 2014-09-27 MED ORDER — HYDROCOD POLST-CPM POLST ER 10-8 MG/5ML PO SUER: 5.0000 mL | Freq: Two times a day (BID) | ORAL | Status: DC | PRN

## 2014-09-27 NOTE — Telephone Encounter (Signed)
Patient having productive cough with colored sputum Starts coughing at night when goes to bed and very difficult to wear his CPAP machine Discussed with  [name] and will Rx Zpak, Tussionex 5 cc every 12 hours as needed, and take Mucinex plain BID as needed Advised patient

## 2014-10-24 ENCOUNTER — Other Ambulatory Visit: Payer: Self-pay | Admitting: Cardiology

## 2014-11-03 ENCOUNTER — Other Ambulatory Visit: Payer: Self-pay | Admitting: Cardiology

## 2014-12-07 ENCOUNTER — Other Ambulatory Visit (INDEPENDENT_AMBULATORY_CARE_PROVIDER_SITE_OTHER): Payer: Medicare Other | Admitting: *Deleted

## 2014-12-07 DIAGNOSIS — I259 Chronic ischemic heart disease, unspecified: Secondary | ICD-10-CM

## 2014-12-07 DIAGNOSIS — I119 Hypertensive heart disease without heart failure: Secondary | ICD-10-CM | POA: Diagnosis not present

## 2014-12-07 LAB — CBC WITH DIFFERENTIAL/PLATELET
Basophils Absolute: 0 10*3/uL (ref 0.0–0.1)
Basophils Relative: 1 % (ref 0.0–3.0)
Eosinophils Absolute: 0.2 10*3/uL (ref 0.0–0.7)
Eosinophils Relative: 4.6 % (ref 0.0–5.0)
HCT: 46.1 % (ref 39.0–52.0)
Hemoglobin: 15.5 g/dL (ref 13.0–17.0)
Lymphocytes Relative: 18.2 % (ref 12.0–46.0)
Lymphs Abs: 0.9 10*3/uL (ref 0.7–4.0)
MCHC: 33.5 g/dL (ref 30.0–36.0)
MCV: 89.7 fl (ref 78.0–100.0)
Monocytes Absolute: 0.6 10*3/uL (ref 0.1–1.0)
Monocytes Relative: 11.8 % (ref 3.0–12.0)
Neutro Abs: 3.1 10*3/uL (ref 1.4–7.7)
Neutrophils Relative %: 64.4 % (ref 43.0–77.0)
Platelets: 175 10*3/uL (ref 150.0–400.0)
RBC: 5.14 Mil/uL (ref 4.22–5.81)
RDW: 15.1 % (ref 11.5–15.5)
WBC: 4.8 10*3/uL (ref 4.0–10.5)

## 2014-12-07 LAB — LIPID PANEL
Cholesterol: 130 mg/dL (ref 0–200)
HDL: 34.5 mg/dL — ABNORMAL LOW
LDL Cholesterol: 72 mg/dL (ref 0–99)
NonHDL: 95.59
Total CHOL/HDL Ratio: 4
Triglycerides: 120 mg/dL (ref 0.0–149.0)
VLDL: 24 mg/dL (ref 0.0–40.0)

## 2014-12-07 LAB — BASIC METABOLIC PANEL WITH GFR
BUN: 24 mg/dL — ABNORMAL HIGH (ref 6–23)
CO2: 32 meq/L (ref 19–32)
Calcium: 9.8 mg/dL (ref 8.4–10.5)
Chloride: 102 meq/L (ref 96–112)
Creatinine, Ser: 1.04 mg/dL (ref 0.40–1.50)
GFR: 75.11 mL/min
Glucose, Bld: 110 mg/dL — ABNORMAL HIGH (ref 70–99)
Potassium: 4.5 meq/L (ref 3.5–5.1)
Sodium: 139 meq/L (ref 135–145)

## 2014-12-07 LAB — HEPATIC FUNCTION PANEL
ALT: 16 U/L (ref 0–53)
AST: 16 U/L (ref 0–37)
Albumin: 4.1 g/dL (ref 3.5–5.2)
Alkaline Phosphatase: 61 U/L (ref 39–117)
Bilirubin, Direct: 0.2 mg/dL (ref 0.0–0.3)
Total Bilirubin: 0.7 mg/dL (ref 0.2–1.2)
Total Protein: 6.8 g/dL (ref 6.0–8.3)

## 2014-12-07 NOTE — Progress Notes (Signed)
Quick Note:  Please make copy of labs for patient visit. ______ 

## 2014-12-11 ENCOUNTER — Other Ambulatory Visit: Payer: Self-pay | Admitting: Cardiology

## 2014-12-12 ENCOUNTER — Ambulatory Visit (INDEPENDENT_AMBULATORY_CARE_PROVIDER_SITE_OTHER): Payer: Medicare Other | Admitting: Cardiology

## 2014-12-12 ENCOUNTER — Encounter: Payer: Self-pay | Admitting: Cardiology

## 2014-12-12 VITALS — BP 110/84 | HR 57 | Ht 66.0 in | Wt 199.8 lb

## 2014-12-12 DIAGNOSIS — E785 Hyperlipidemia, unspecified: Secondary | ICD-10-CM | POA: Diagnosis not present

## 2014-12-12 DIAGNOSIS — E559 Vitamin D deficiency, unspecified: Secondary | ICD-10-CM | POA: Insufficient documentation

## 2014-12-12 DIAGNOSIS — I119 Hypertensive heart disease without heart failure: Secondary | ICD-10-CM

## 2014-12-12 DIAGNOSIS — I259 Chronic ischemic heart disease, unspecified: Secondary | ICD-10-CM

## 2014-12-12 NOTE — Patient Instructions (Signed)
Medication Instructions:  Your physician recommends that you continue on your current medications as directed. Please refer to the Current Medication list given to you today.  Labwork: none  Testing/Procedures: none  Follow-Up: Your physician recommends that you schedule a follow-up appointment in: 4 months with fasting labs (lp/bmet/hfp,vit D)
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
## 2015-01-01 ENCOUNTER — Other Ambulatory Visit: Payer: Self-pay | Admitting: Cardiology

## 2015-01-22 ENCOUNTER — Other Ambulatory Visit: Payer: Self-pay

## 2015-01-22 DIAGNOSIS — I119 Hypertensive heart disease without heart failure: Secondary | ICD-10-CM

## 2015-01-22 MED ORDER — AMLODIPINE BESYLATE 10 MG PO TABS: 10.0000 mg | ORAL_TABLET | Freq: Every day | ORAL | Status: DC

## 2015-01-22 NOTE — Telephone Encounter (Signed)
Darlin Coco, MD at 12/12/2014 8:24 AM  amLODipine (NORVASC) 10 MG tabletTake 1 tablet (10 mg total) by mouth daily Current medicines are reviewed at length with the patient today. The patient does not have concerns regarding medicines. The following changes have been made: no change

## 2015-04-02 ENCOUNTER — Other Ambulatory Visit: Payer: Self-pay | Admitting: Cardiology

## 2015-04-05 IMAGING — CR DG CHEST 1V PORT
1 series · 1 of 1 positions shown · non-contrast
Comparison: 10/29/2005

CLINICAL DATA: Chest pain

PORTABLE CHEST - 1 VIEW

[AP]
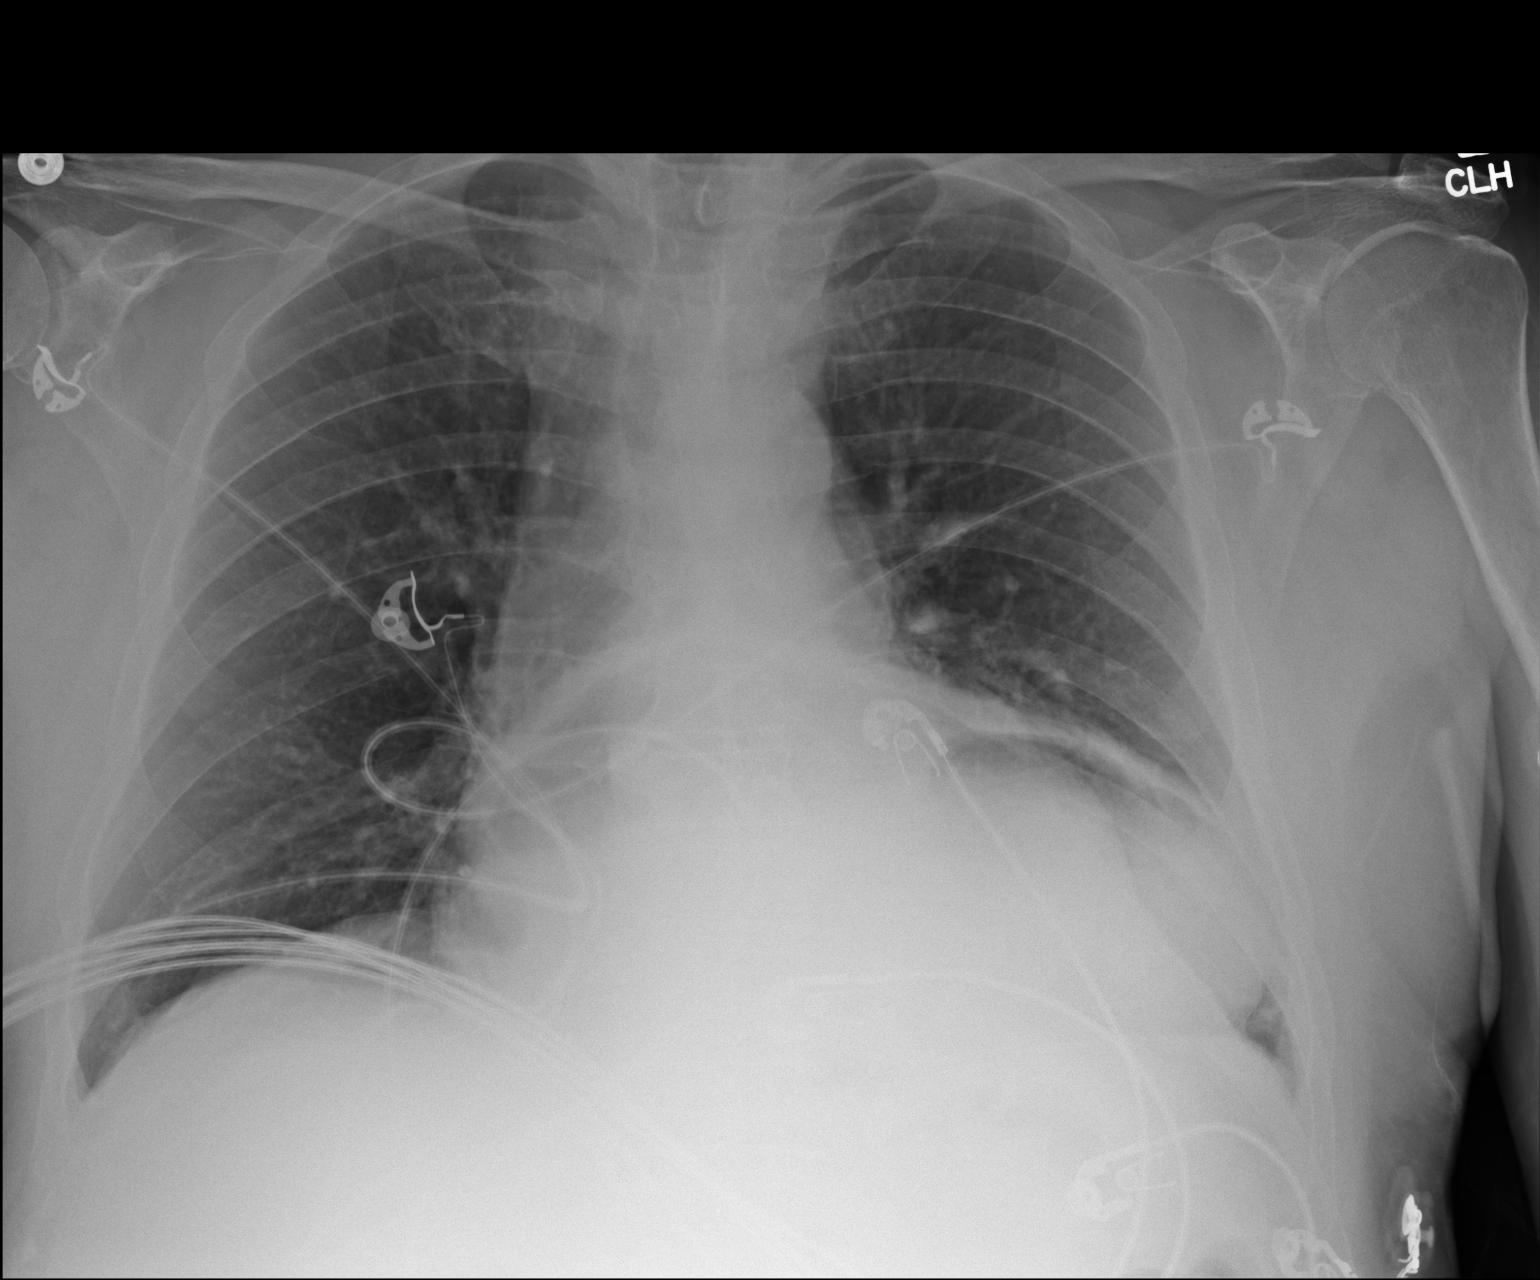

[1 of 1 positions shown; findings below may reference images not displayed]

FINDINGS: Large hiatal hernia.  This obscures underlying cardiac
silhouette. Aorta is ectatic and unfolded.  Lungs are clear.  Left
lung base obscured by hiatal hernia.  No acute osseous finding.
IMPRESSION: No acute cardiopulmonary process.

Large hiatal hernia.

## 2015-04-18 NOTE — Addendum Note (Signed)
Addended by: Eulis Foster on: 04/18/2015 04:01 PM   Modules accepted: Orders

## 2015-04-19 ENCOUNTER — Other Ambulatory Visit (INDEPENDENT_AMBULATORY_CARE_PROVIDER_SITE_OTHER): Payer: Medicare Other | Admitting: *Deleted

## 2015-04-19 DIAGNOSIS — I119 Hypertensive heart disease without heart failure: Secondary | ICD-10-CM | POA: Diagnosis not present

## 2015-04-19 DIAGNOSIS — K449 Diaphragmatic hernia without obstruction or gangrene: Secondary | ICD-10-CM

## 2015-04-19 DIAGNOSIS — G4733 Obstructive sleep apnea (adult) (pediatric): Secondary | ICD-10-CM

## 2015-04-19 DIAGNOSIS — I259 Chronic ischemic heart disease, unspecified: Secondary | ICD-10-CM

## 2015-04-19 DIAGNOSIS — R0789 Other chest pain: Secondary | ICD-10-CM

## 2015-04-19 DIAGNOSIS — E785 Hyperlipidemia, unspecified: Secondary | ICD-10-CM

## 2015-04-19 DIAGNOSIS — D500 Iron deficiency anemia secondary to blood loss (chronic): Secondary | ICD-10-CM | POA: Diagnosis not present

## 2015-04-19 DIAGNOSIS — Z955 Presence of coronary angioplasty implant and graft: Secondary | ICD-10-CM

## 2015-04-19 LAB — CBC WITH DIFFERENTIAL/PLATELET
Basophils Absolute: 0.1 10*3/uL (ref 0.0–0.1)
Basophils Relative: 1 % (ref 0–1)
Eosinophils Absolute: 0.3 10*3/uL (ref 0.0–0.7)
Eosinophils Relative: 5 % (ref 0–5)
HCT: 44.1 % (ref 39.0–52.0)
Hemoglobin: 15.1 g/dL (ref 13.0–17.0)
Lymphocytes Relative: 16 % (ref 12–46)
Lymphs Abs: 0.8 10*3/uL (ref 0.7–4.0)
MCH: 30.4 pg (ref 26.0–34.0)
MCHC: 34.2 g/dL (ref 30.0–36.0)
MCV: 88.7 fL (ref 78.0–100.0)
MPV: 11.6 fL (ref 8.6–12.4)
Monocytes Absolute: 0.8 10*3/uL (ref 0.1–1.0)
Monocytes Relative: 16 % — ABNORMAL HIGH (ref 3–12)
Neutro Abs: 3.2 10*3/uL (ref 1.7–7.7)
Neutrophils Relative %: 62 % (ref 43–77)
Platelets: 157 10*3/uL (ref 150–400)
RBC: 4.97 MIL/uL (ref 4.22–5.81)
RDW: 14.4 % (ref 11.5–15.5)
WBC: 5.1 10*3/uL (ref 4.0–10.5)

## 2015-04-19 LAB — HEPATIC FUNCTION PANEL
ALT: 15 U/L (ref 9–46)
AST: 15 U/L (ref 10–35)
Albumin: 3.8 g/dL (ref 3.6–5.1)
Alkaline Phosphatase: 51 U/L (ref 40–115)
Bilirubin, Direct: 0.1 mg/dL (ref <=0.2)
Indirect Bilirubin: 0.4 mg/dL (ref 0.2–1.2)
Total Bilirubin: 0.5 mg/dL (ref 0.2–1.2)
Total Protein: 6.1 g/dL (ref 6.1–8.1)

## 2015-04-19 LAB — BASIC METABOLIC PANEL
BUN: 16 mg/dL (ref 7–25)
CO2: 25 mmol/L (ref 20–31)
Calcium: 8.9 mg/dL (ref 8.6–10.3)
Chloride: 105 mmol/L (ref 98–110)
Creat: 0.94 mg/dL (ref 0.70–1.18)
Glucose, Bld: 103 mg/dL — ABNORMAL HIGH (ref 65–99)
Potassium: 4.3 mmol/L (ref 3.5–5.3)
Sodium: 137 mmol/L (ref 135–146)

## 2015-04-19 LAB — LIPID PANEL
Cholesterol: 111 mg/dL — ABNORMAL LOW (ref 125–200)
HDL: 30 mg/dL — ABNORMAL LOW (ref >=40)
LDL Cholesterol: 64 mg/dL (ref <130)
Total CHOL/HDL Ratio: 3.7 Ratio (ref <=5.0)
Triglycerides: 84 mg/dL (ref <150)
VLDL: 17 mg/dL (ref <30)

## 2015-04-19 LAB — VITAMIN D 25 HYDROXY (VIT D DEFICIENCY, FRACTURES): Vit D, 25-Hydroxy: 36 ng/mL (ref 30–100)

## 2015-04-19 NOTE — Addendum Note (Signed)
Addended by: Domenica Reamer R on: 04/19/2015 08:00 AM   Modules accepted: Orders

## 2015-04-19 NOTE — Progress Notes (Signed)
Quick Note:  Please make copy of labs for patient visit. ______ 

## 2015-04-25 ENCOUNTER — Encounter: Payer: Self-pay | Admitting: Cardiology

## 2015-04-25 ENCOUNTER — Ambulatory Visit (INDEPENDENT_AMBULATORY_CARE_PROVIDER_SITE_OTHER): Payer: Medicare Other | Admitting: Cardiology

## 2015-04-25 VITALS — BP 110/64 | HR 60 | Ht 67.0 in | Wt 203.0 lb

## 2015-04-25 DIAGNOSIS — I119 Hypertensive heart disease without heart failure: Secondary | ICD-10-CM | POA: Diagnosis not present

## 2015-04-25 NOTE — Patient Instructions (Signed)
Medication Instructions:  Your physician recommends that you continue on your current medications as directed. Please refer to the Current Medication list given to you today.  Labwork: none  Testing/Procedures: none  Follow-Up: Your physician wants you to follow-up in: 4 month ov with Dr Pat Kocher will receive a reminder letter in the mail two months in advance. If you don't receive a letter, please call our office to schedule the follow-up appointment.  If you need a refill on your cardiac medications before your next appointment, please call your pharmacy.
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
## 2015-05-21 ENCOUNTER — Other Ambulatory Visit: Payer: Self-pay | Admitting: Cardiology

## 2015-07-23 ENCOUNTER — Other Ambulatory Visit: Payer: Self-pay | Admitting: Cardiology

## 2015-08-20 ENCOUNTER — Ambulatory Visit (INDEPENDENT_AMBULATORY_CARE_PROVIDER_SITE_OTHER): Payer: Medicare Other | Admitting: Internal Medicine

## 2015-08-20 ENCOUNTER — Encounter: Payer: Self-pay | Admitting: Internal Medicine

## 2015-08-20 VITALS — BP 124/80 | HR 54 | Ht 67.0 in | Wt 201.8 lb

## 2015-08-20 DIAGNOSIS — G4733 Obstructive sleep apnea (adult) (pediatric): Secondary | ICD-10-CM

## 2015-08-20 NOTE — Patient Instructions (Signed)
We can continue CPAP 12/ Advanced. You seem to be doing very well  Please call if we can help

## 2015-08-20 NOTE — Progress Notes (Signed)
02/15/13- 61 yoM former smoker referred courtesy of Dr Sharyon Medicus study attached Wife here Chronic complaints of daytime sleepiness loud snoring and witnessed apneas. He denies problems driving and uses little caffeine. Bedtime 10 and 11 PM, sleep latency 10 minutes, waking 2-3 times for bathroom before up between 6 and 7 AM. ENT surgery limited to tonsils. Medical history of myocardial infarction, hypertension without lung disease. NPSG 01/18/13 [facility]  22/hr, moderate OSA, weight  195 lbs  04/18/13- 67 yoM former smoker followed for OSA, complicated by CAD, HBP Wearing CPAP Auto/ Advanced for 8 hours per night.  Discuss new face mask Download showed inadequate use, but patient and her husband say the CPAP machine modem signal is poor and often interrupted. There is also very poor cell phone reception at their home. He assures me he is using CPAP. Wife confirms he uses it. It stops his snoring.  08/17/13- 67 yoM former smoker followed for OSA, complicated by CAD, HBP CPAP 12/ [facility] all night every night Download confirms excellent compliance.  He is "used to CPAP now" and comfortable. Wife confirms.  08/18/14- 67 yoM former smoker followed for OSA, complicated by CAD, HBP CPAP 12/ [facility]  FOLLOWS FOR: Wears CPAP every night for about 6-7 hours; DME is [facility]. No new supplies needed at this time.  08/20/2015-70 year old male former smoker followed for OSA, complicated by CAD/stent, HBP CPAP 12/ [facility] all night every night FOLLOWS FOR: DME: [facility], Wears CPAP every night  and pressure working well.    ROS-see HPI Constitutional:   No-   weight loss, night sweats, fevers, chills, fatigue, lassitude. HEENT:   No-  headaches, difficulty swallowing, tooth/dental problems, sore throat,       No-  sneezing, itching, ear ache, nasal congestion, post nasal drip,  CV:  No-   chest pain, orthopnea, PND, swelling in lower extremities, anasarca, dizziness, palpitations Resp: No-   shortness of breath with  exertion or at rest.              No-   productive cough,  No non-productive cough,  No- coughing up of blood.              No-   change in color of mucus.  No- wheezing.   Skin: No-   rash or lesions. GI:  No-   heartburn, indigestion, abdominal pain, nausea, vomiting,  GU:  MS:  No-   joint pain or swelling.   Neuro-     nothing unusual Psych:  No- change in mood or affect. No depression or anxiety.  No memory loss.  OBJ- Physical Exam General- Alert, Oriented, Affect-appropriate, Distress- none acute. Overweight Skin- rash-none, lesions- none, excoriation- none Lymphadenopathy- none Head- atraumatic            Eyes- Gross vision intact, PERRLA, conjunctivae and secretions clear            Ears- Hearing, canals-normal            Nose- Clear, no-Septal dev, mucus, polyps, erosion, perforation             Throat- Mallampati II-III , mucosa clear , drainage- none, tonsils- atrophic Neck- flexible , trachea midline, no stridor , thyroid nl, carotid no bruit Chest - symmetrical excursion , unlabored           Heart/CV- RRR , no murmur , no gallop  , no rub, nl s1 s2                           -  JVD- none , edema- none, stasis changes- none, varices- none           Lung- clear to P&A, wheeze- none, cough- none , dullness-none, rub- none           Chest wall-  Abd-  Br/ Gen/ Rectal- Not done, not indicated Extrem- cyanosis- none, clubbing, none, atrophy- none, strength- nl Neuro- grossly intact to observation
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
## 2015-08-23 ENCOUNTER — Ambulatory Visit (INDEPENDENT_AMBULATORY_CARE_PROVIDER_SITE_OTHER): Payer: Medicare Other | Admitting: Cardiovascular Disease

## 2015-08-23 ENCOUNTER — Encounter: Payer: Self-pay | Admitting: Cardiovascular Disease

## 2015-08-23 VITALS — BP 107/67 | HR 54 | Ht 67.0 in | Wt 200.4 lb

## 2015-08-23 DIAGNOSIS — R05 Cough: Secondary | ICD-10-CM

## 2015-08-23 DIAGNOSIS — R600 Localized edema: Secondary | ICD-10-CM | POA: Insufficient documentation

## 2015-08-23 DIAGNOSIS — R059 Cough, unspecified: Secondary | ICD-10-CM

## 2015-08-23 DIAGNOSIS — I2583 Coronary atherosclerosis due to lipid rich plaque: Secondary | ICD-10-CM

## 2015-08-23 DIAGNOSIS — I119 Hypertensive heart disease without heart failure: Secondary | ICD-10-CM | POA: Diagnosis not present

## 2015-08-23 DIAGNOSIS — E785 Hyperlipidemia, unspecified: Secondary | ICD-10-CM | POA: Diagnosis not present

## 2015-08-23 DIAGNOSIS — I2510 Atherosclerotic heart disease of native coronary artery without angina pectoris: Secondary | ICD-10-CM | POA: Diagnosis not present

## 2015-08-23 HISTORY — DX: Localized edema: R60.0

## 2015-08-23 MED ORDER — FUROSEMIDE 40 MG PO TABS: 40.0000 mg | ORAL_TABLET | Freq: Every day | ORAL | Status: DC | PRN

## 2015-08-23 NOTE — Patient Instructions (Addendum)
Medication Instructions:  INCREASE LASIX (FUROSEMIDE) 40 MG DAILY FOR 5 DAYS AND THEN AS NEEDED FOR SWELLING  Labwork: NONE  Testing/Procedures: NONE  Follow-Up: Your physician recommends that you schedule a follow-up appointment in: [place]   If you need a refill on your cardiac medications before your next appointment, please call your pharmacy.

## 2015-08-28 ENCOUNTER — Encounter: Payer: Self-pay | Admitting: Cardiovascular Disease

## 2015-10-22 ENCOUNTER — Other Ambulatory Visit: Payer: Self-pay | Admitting: *Deleted

## 2015-10-22 MED ORDER — CLOPIDOGREL BISULFATE 75 MG PO TABS: 75.0000 mg | ORAL_TABLET | Freq: Every day | ORAL | Status: DC

## 2015-10-22 NOTE — Addendum Note (Signed)
Addended by: Domenica Reamer R on: 10/22/2015 04:25 PM   Modules accepted: Orders

## 2015-12-03 ENCOUNTER — Ambulatory Visit: Payer: Medicare Other | Admitting: Podiatry

## 2015-12-05 ENCOUNTER — Encounter: Payer: Self-pay | Admitting: Internal Medicine

## 2015-12-27 ENCOUNTER — Encounter: Payer: Self-pay | Admitting: Cardiovascular Disease

## 2015-12-27 ENCOUNTER — Ambulatory Visit (INDEPENDENT_AMBULATORY_CARE_PROVIDER_SITE_OTHER): Payer: Medicare Other | Admitting: Cardiovascular Disease

## 2015-12-27 VITALS — BP 123/82 | HR 49 | Ht 67.0 in | Wt 201.2 lb

## 2015-12-27 DIAGNOSIS — I10 Essential (primary) hypertension: Secondary | ICD-10-CM

## 2015-12-27 DIAGNOSIS — R600 Localized edema: Secondary | ICD-10-CM | POA: Diagnosis not present

## 2015-12-27 DIAGNOSIS — I2510 Atherosclerotic heart disease of native coronary artery without angina pectoris: Secondary | ICD-10-CM | POA: Diagnosis not present

## 2015-12-27 DIAGNOSIS — R001 Bradycardia, unspecified: Secondary | ICD-10-CM

## 2015-12-27 DIAGNOSIS — E785 Hyperlipidemia, unspecified: Secondary | ICD-10-CM
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
## 2015-12-27 NOTE — Patient Instructions (Addendum)
Medication Instructions:  DECREASE YOUR CARVEDILOL TO 12.5 MG (1/2 25 MG) TWICE A DAY  Labwork: NONE  Testing/Procedures: NONE  Follow-Up: Your physician recommends that you schedule a follow-up appointment in: 3 MONTH OV   Any Other Special Instructions Will Be Listed Below (If Applicable). KEEP A LOG OF YOUR BLOOD PRESSURE AND HEART RATE IF YOU BLOOD PRESSURE STARTS RUNNING ABOVE 140/90 THEN CALL THE OFFICE  If you need a refill on your cardiac medications before your next appointment, please call your pharmacy.

## 2016-01-22 ENCOUNTER — Other Ambulatory Visit: Payer: Self-pay

## 2016-01-22 DIAGNOSIS — I119 Hypertensive heart disease without heart failure: Secondary | ICD-10-CM

## 2016-01-22 MED ORDER — AMLODIPINE BESYLATE 10 MG PO TABS: 10.0000 mg | ORAL_TABLET | Freq: Every day | ORAL | 3 refills | Status: DC

## 2016-03-31 ENCOUNTER — Encounter: Payer: Self-pay | Admitting: Cardiovascular Disease

## 2016-03-31 ENCOUNTER — Ambulatory Visit (INDEPENDENT_AMBULATORY_CARE_PROVIDER_SITE_OTHER): Payer: Medicare Other | Admitting: Cardiovascular Disease

## 2016-03-31 VITALS — BP 118/80 | HR 60 | Ht 67.0 in | Wt 195.8 lb

## 2016-03-31 DIAGNOSIS — I119 Hypertensive heart disease without heart failure: Secondary | ICD-10-CM

## 2016-03-31 DIAGNOSIS — E7800 Pure hypercholesterolemia, unspecified: Secondary | ICD-10-CM | POA: Diagnosis not present

## 2016-03-31 DIAGNOSIS — R001 Bradycardia, unspecified: Secondary | ICD-10-CM

## 2016-03-31 DIAGNOSIS — Z955 Presence of coronary angioplasty implant and graft: Secondary | ICD-10-CM

## 2016-03-31 NOTE — Patient Instructions (Signed)
Medication Instructions:  Your physician recommends that you continue on your current medications as directed. Please refer to the Current Medication list given to you today.  Labwork: none  Testing/Procedures: none  Follow-Up: Your physician wants you to follow-up in: 6 month ov You will receive a reminder letter in the mail two months in advance. If you don't receive a letter, please call our office to schedule the follow-up appointment.  Any Other Special Instructions Will Be Listed Below (If Applicable). Follow up with ENT regarding your cough   If you need a refill on your cardiac medications before your next appointment, please call your pharmacy.
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
## 2016-08-19 ENCOUNTER — Encounter: Payer: Self-pay | Admitting: Internal Medicine

## 2016-08-19 ENCOUNTER — Ambulatory Visit (INDEPENDENT_AMBULATORY_CARE_PROVIDER_SITE_OTHER): Payer: Medicare Other | Admitting: Internal Medicine

## 2016-08-19 DIAGNOSIS — I259 Chronic ischemic heart disease, unspecified: Secondary | ICD-10-CM | POA: Diagnosis not present

## 2016-08-19 DIAGNOSIS — G4733 Obstructive sleep apnea (adult) (pediatric): Secondary | ICD-10-CM | POA: Diagnosis not present

## 2016-08-19 NOTE — Patient Instructions (Signed)
We can continue CPAP 12, mask of chice, humidifier, supplies, AirView   Dx OSA  You can ask [facility] when you may be eligible for a new CPAP machine, but as long as yours is working well, it is fine.  Please call if we can help

## 2016-08-19 NOTE — Assessment & Plan Note (Signed)
He denies any change in his cardiac status or new events since last here. Followed by cardiology.

## 2016-08-19 NOTE — Assessment & Plan Note (Signed)
He and his wife confirms good compliance and control. He feels he continues to benefit using CPAP 12 night every night. Sleeps well. No changes required. Options and humidifier adjustment discussed.

## 2016-08-19 NOTE — Progress Notes (Signed)
HPI male former smoker followed for OSA, complicated by CAD/stent, HBP, GERD NPSG 01/18/13 AHI  22/hr, moderate OSA, weight  195 lbs  ----------------------------------------------------------------------------  08/18/14- 67 yoM former smoker followed for OSA, complicated by CAD, HBP CPAP 12/ [facility]  FOLLOWS FOR: Wears CPAP every night for about 6-7 hours; DME is [facility]. No new supplies needed at this time.  08/20/2015-71 year old male former smoker followed for OSA, complicated by CAD/stent, HBP CPAP 12/ [facility] all night every night FOLLOWS FOR: DME: [facility], Wears CPAP every night  and pressure working well.  08/19/16- 71 year old male former smoker followed for OSA, complicated by CAD/stent, HBP, GERD CPAP 12/ [facility] follow up 1 year for OSA patient states that he is feeling good and that he is not having any problems.  Wife confirms he uses his CPAP all night every night and he says he definitely sleeps better using it. Download 99% 4 hour compliance, AHI 0.7/hour. He is comfortable with a fullface mask. Machine seems to be working well. He denies new medical problems or health status change.  ROS-see HPI Constitutional:   No-   weight loss, night sweats, fevers, chills, fatigue, lassitude. HEENT:   No-  headaches, difficulty swallowing, tooth/dental problems, sore throat,       No-  sneezing, itching, ear ache, nasal congestion, post nasal drip,  CV:  No-   chest pain, orthopnea, PND, swelling in lower extremities, anasarca, dizziness, palpitations Resp: No-   shortness of breath with exertion or at rest.              No-   productive cough,  No non-productive cough,  No- coughing up of blood.              No-   change in color of mucus.  No- wheezing.   Skin: No-   rash or lesions. GI:  No-   heartburn, indigestion, abdominal pain, nausea, vomiting,  GU:  MS:  No-   joint pain or swelling.   Neuro-     nothing unusual Psych:  No- change in mood or affect. No depression or anxiety.  No memory  loss.  OBJ- Physical Exam   stable baseline exam General- Alert, Oriented, Affect-appropriate, Distress- none acute. Overweight Skin- rash-none, lesions- none, excoriation- none Lymphadenopathy- none Head- atraumatic            Eyes- Gross vision intact, PERRLA, conjunctivae and secretions clear            Ears- Hearing, canals-normal            Nose- Clear, no-Septal dev, mucus, polyps, erosion, perforation             Throat- Mallampati II-III , mucosa clear , drainage- none, tonsils- atrophic Neck- flexible , trachea midline, no stridor , thyroid nl, carotid no bruit Chest - symmetrical excursion , unlabored           Heart/CV- RRR , no murmur , no gallop  , no rub, nl s1 s2                           - JVD- none , edema- none, stasis changes- none, varices- none           Lung- clear to P&A, wheeze- none, cough- none , dullness-none, rub- none           Chest wall-  Abd-  Br/ Gen/ Rectal- Not done, not indicated Extrem- cyanosis- none, clubbing, none, atrophy-  none, strength- nl Neuro- grossly intact to observation
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
## 2016-09-29 ENCOUNTER — Other Ambulatory Visit: Payer: Self-pay

## 2016-09-29 ENCOUNTER — Ambulatory Visit (INDEPENDENT_AMBULATORY_CARE_PROVIDER_SITE_OTHER): Payer: Medicare Other | Admitting: Cardiovascular Disease

## 2016-09-29 VITALS — BP 112/76 | HR 54

## 2016-09-29 DIAGNOSIS — Z955 Presence of coronary angioplasty implant and graft: Secondary | ICD-10-CM

## 2016-09-29 DIAGNOSIS — E7800 Pure hypercholesterolemia, unspecified: Secondary | ICD-10-CM | POA: Diagnosis not present

## 2016-09-29 DIAGNOSIS — R001 Bradycardia, unspecified: Secondary | ICD-10-CM

## 2016-09-29 DIAGNOSIS — I119 Hypertensive heart disease without heart failure: Secondary | ICD-10-CM | POA: Diagnosis not present

## 2016-09-29 NOTE — Patient Instructions (Signed)
Continue same medications.   Your physician wants you to follow-up in: 6 months.  You will receive a reminder letter in the mail two months in advance. If you don't receive a letter, please call our office to schedule the follow-up appointment.  

## 2016-10-13 ENCOUNTER — Other Ambulatory Visit: Payer: Self-pay | Admitting: Cardiovascular Disease

## 2016-10-13 NOTE — Telephone Encounter (Signed)
Please review for refill, thanks ! 

## 2017-01-12 ENCOUNTER — Other Ambulatory Visit: Payer: Self-pay | Admitting: *Deleted

## 2017-01-12 DIAGNOSIS — I119 Hypertensive heart disease without heart failure: Secondary | ICD-10-CM

## 2017-01-12 MED ORDER — AMLODIPINE BESYLATE 10 MG PO TABS: 10.0000 mg | ORAL_TABLET | Freq: Every day | ORAL | 3 refills | Status: DC

## 2017-02-02 ENCOUNTER — Telehealth: Payer: Self-pay | Admitting: Cardiovascular Disease

## 2017-02-02 NOTE — Telephone Encounter (Signed)
Mr. Mollenkopf wife presented for a clinic appointment and noted that her husband's blood pressure has been running in the 90s/60s.  He will reduce amlodipine to 5mg  and call if his BP isn't improved.  Creg Gilmer C. Oval Linsey, MD, [facility] 02/02/2017 11:15 AM

## 2017-03-31 ENCOUNTER — Ambulatory Visit: Payer: Medicare Other | Admitting: Cardiovascular Disease
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
## 2017-04-24 ENCOUNTER — Encounter: Payer: Self-pay | Admitting: Cardiovascular Disease

## 2017-04-24 ENCOUNTER — Ambulatory Visit: Payer: Medicare Other | Admitting: Cardiovascular Disease

## 2017-04-24 VITALS — BP 131/83 | HR 53 | Ht 67.0 in | Wt 198.0 lb

## 2017-04-24 DIAGNOSIS — Z955 Presence of coronary angioplasty implant and graft: Secondary | ICD-10-CM

## 2017-04-24 DIAGNOSIS — R001 Bradycardia, unspecified: Secondary | ICD-10-CM | POA: Diagnosis not present

## 2017-04-24 DIAGNOSIS — E7800 Pure hypercholesterolemia, unspecified: Secondary | ICD-10-CM

## 2017-04-24 DIAGNOSIS — I119 Hypertensive heart disease without heart failure: Secondary | ICD-10-CM

## 2017-04-24 MED ORDER — AMLODIPINE BESYLATE 10 MG PO TABS: 5.0000 mg | ORAL_TABLET | Freq: Every day | ORAL | 3 refills | Status: DC

## 2017-04-24 NOTE — Patient Instructions (Signed)
Medication Instructions:  Your physician recommends that you continue on your current medications as directed. Please refer to the Current Medication list given to you today.  Labwork: NONE  Testing/Procedures: NONE  Follow-Up: Your physician recommends that you schedule a follow-up appointment WITH PHARM D FOR BLOOD PRESSURE SOON   Your physician wants you to follow-up in: [place] will receive a reminder letter in the mail two months in advance. If you don't receive a letter, please call our office to schedule the follow-up appointment.   If you need a refill on your cardiac medications before your next appointment, please call your pharmacy.
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
## 2017-05-06 ENCOUNTER — Ambulatory Visit: Payer: Medicare Other | Admitting: Pharmacist

## 2017-05-06 VITALS — BP 116/78 | HR 52

## 2017-05-06 DIAGNOSIS — I119 Hypertensive heart disease without heart failure: Secondary | ICD-10-CM

## 2017-05-06 NOTE — Patient Instructions (Signed)
Return for a  follow up appointment as needed  Check your blood pressure at home daily (if able) and keep record of the readings.  Take your BP meds as follows: *ALL medication as previously prescribed*  Bring all of your meds, your BP cuff and your record of home blood pressures to your next appointment.  Exercise as you're able, try to walk approximately 30 minutes per day.  Keep salt intake to a minimum, especially watch canned and prepared boxed foods.  Eat more fresh fruits and vegetables and fewer canned items.  Avoid eating in fast food restaurants.    HOW TO TAKE YOUR BLOOD PRESSURE: . Rest 5 minutes before taking your blood pressure. .  Don't smoke or drink caffeinated beverages for at least 30 minutes before. . Take your blood pressure before (not after) you eat. . Sit comfortably with your back supported and both feet on the floor (don't cross your legs). . Elevate your arm to heart level on a table or a desk. . Use the proper sized cuff. It should fit smoothly and snugly around your bare upper arm. There should be enough room to slip a fingertip under the cuff. The bottom edge of the cuff should be 1 inch above the crease of the elbow. . Ideally, take 3 measurements at one sitting and record the average.

## 2017-05-07 ENCOUNTER — Encounter: Payer: Self-pay | Admitting: Pharmacist

## 2017-05-07 NOTE — Assessment & Plan Note (Signed)
Blood pressure today is well controlled. Noted average home BP readying at goal with average of 127/80. Patient denies problems with current therapy or issues with compliance. His home BP monitor was calibrated today and determined to be accurate with less than 72mmHg difference from manual readying. Will continue working with lifestyle modification, continue current pharmacological therapy without changes, and follow up as needed.

## 2017-05-13 ENCOUNTER — Telehealth: Payer: Self-pay | Admitting: *Deleted

## 2017-05-13 NOTE — Telephone Encounter (Signed)
Patient needs a script for recombinant zoster vaccine.  He will need a paper prescription to be taken to pharmacy.

## 2017-05-13 NOTE — Telephone Encounter (Signed)
Patient's wife calling inquiring about getting a prescription for the new shingles vaccine. She was wondering if her PCP should do this for her husband or if this office would. Message routed to the primary for her recommendation.

## 2017-05-15 NOTE — Telephone Encounter (Signed)
Left message to call back  

## 2017-05-19 MED ORDER — ZOSTER VAC RECOMB ADJUVANTED 50 MCG/0.5ML IM SUSR: 0.5000 mL | Freq: Once | INTRAMUSCULAR | 1 refills | Status: AC

## 2017-05-19 NOTE — Telephone Encounter (Signed)
Spoke with [facility] and verified [facility] correct Rx. Sent to [facility] via [facility]

## 2017-07-27 ENCOUNTER — Telehealth: Payer: Self-pay | Admitting: Cardiovascular Disease

## 2017-07-27 DIAGNOSIS — I119 Hypertensive heart disease without heart failure: Secondary | ICD-10-CM

## 2017-07-27 MED ORDER — CARVEDILOL 25 MG PO TABS: 25.0000 mg | ORAL_TABLET | ORAL | 0 refills | Status: DC

## 2017-07-27 MED ORDER — SIMVASTATIN 20 MG PO TABS: 20.0000 mg | ORAL_TABLET | Freq: Every day | ORAL | 0 refills | Status: DC

## 2017-07-27 MED ORDER — CLOPIDOGREL BISULFATE 75 MG PO TABS: 75.0000 mg | ORAL_TABLET | Freq: Every day | ORAL | 0 refills | Status: DC

## 2017-07-27 MED ORDER — AMLODIPINE BESYLATE 10 MG PO TABS: 5.0000 mg | ORAL_TABLET | Freq: Every day | ORAL | 0 refills | Status: DC

## 2017-07-27 NOTE — Telephone Encounter (Signed)
New Message:    Pt states he has forgotten his medication and is on vacation for 7 days. Pt asks if we could send a prescription just to last him until he gets back home.     *STAT* If patient is at the pharmacy, call can be transferred to refill team.   1. Which medications need to be refilled? (please list name of each medication and dose if known)  amLODipine (NORVASC) 10 MG tableamLODipine (NORVASC) 10 MG tablet  carvedilol (COREG) 25 MG tablet  clopidogrel (PLAVIX) 75 MG tablet  simvastatin (ZOCOR) 20 MG tablet  2. Which pharmacy/location (including street and city if local pharmacy) is medication to be sent to? [facility] [address] [place] [address] [phone] (phone)   3. Do they need a 30 day or 90 day supply? 7 day

## 2017-07-28 ENCOUNTER — Encounter: Payer: Self-pay | Admitting: Cardiovascular Disease

## 2017-08-03 ENCOUNTER — Encounter: Payer: Self-pay | Admitting: Cardiovascular Disease

## 2017-08-19 ENCOUNTER — Ambulatory Visit: Payer: Medicare Other | Admitting: Internal Medicine

## 2017-08-19 ENCOUNTER — Encounter: Payer: Self-pay | Admitting: Internal Medicine

## 2017-08-19 DIAGNOSIS — G4733 Obstructive sleep apnea (adult) (pediatric): Secondary | ICD-10-CM | POA: Diagnosis not present

## 2017-08-19 DIAGNOSIS — I259 Chronic ischemic heart disease, unspecified: Secondary | ICD-10-CM

## 2017-08-19 NOTE — Assessment & Plan Note (Signed)
He continues to benefit, demonstrating excellent compliance and control.  Wife makes sure he wears it because it prevents his snoring. Plan-continue CPAP 12

## 2017-08-19 NOTE — Patient Instructions (Signed)
We can continue CPAP 12, mask of choice, humidifier, supplies, AirView  Please call if we can help 

## 2017-08-19 NOTE — Assessment & Plan Note (Signed)
He denies new cardiac issues in the past year and is followed by cardiology.

## 2017-08-19 NOTE — Progress Notes (Signed)
HPI male former smoker followed for OSA, complicated by CAD/stent, HBP, GERD NPSG 01/18/13 AHI  22/hr, moderate OSA, weight  195 lbs  ---------------------------------------------------------------------------.  08/19/16- 72 year old male former smoker followed for OSA, complicated by CAD/MI/stent, HBP, GERD CPAP 12/ [facility] follow up 1 year for OSA patient states that he is feeling good and that he is not having any problems.  Wife confirms he uses his CPAP all night every night and he says he definitely sleeps better using it. Download 99% 4 hour compliance, AHI 0.7/hour. He is comfortable with a fullface mask. Machine seems to be working well. He denies new medical problems or health status change.  08/19/2017-72 year old male former smoker followed for OSA, complicated by CAD/stent, HBP, GERD CPAP 12/ [facility] ----OSA: DME [facility] Pt wears CPAP nightly for at least 7 hours and pressure works well. No supplies needed Download 100% compliance AHI 1.5/hour.  His wife says that he snores loudly if he does not wear his CPAP.  He sleeps better with it and is quite satisfied with no changes needed. He denies cardiac or other significant issues since last year.  ROS-see HPI   + = positive Constitutional:   No-   weight loss, night sweats, fevers, chills, fatigue, lassitude. HEENT:   No-  headaches, difficulty swallowing, tooth/dental problems, sore throat,       No-  sneezing, itching, ear ache, nasal congestion, post nasal drip,  CV:  No-   chest pain, orthopnea, PND, swelling in lower extremities, anasarca, dizziness, palpitations Resp: No-   shortness of breath with exertion or at rest.              No-   productive cough,  No non-productive cough,  No- coughing up of blood.              No-   change in color of mucus.  No- wheezing.   Skin: No-   rash or lesions. GI:  No-   heartburn, indigestion, abdominal pain, nausea, vomiting,  GU:  MS:  No-   joint pain or swelling.   Neuro-     nothing  unusual Psych:  No- change in mood or affect. No depression or anxiety.  No memory loss.  OBJ- Physical Exam   stable baseline exam General- Alert, Oriented, Affect-appropriate, Distress- none acute. Overweight Skin- rash-none, lesions- none, excoriation- none Lymphadenopathy- none Head- atraumatic            Eyes- Gross vision intact, PERRLA, conjunctivae and secretions clear            Ears- Hearing, canals-normal            Nose- Clear, no-Septal dev, mucus, polyps, erosion, perforation             Throat- Mallampati II-III , mucosa clear , drainage- none, tonsils- atrophic Neck- flexible , trachea midline, no stridor , thyroid nl, carotid no bruit Chest - symmetrical excursion , unlabored           Heart/CV- RRR , no murmur , no gallop  , no rub, nl s1 s2                           - JVD- none , edema- none, stasis changes- none, varices- none           Lung- clear to P&A, wheeze- none, cough- none , dullness-none, rub- none           Chest wall-  Abd-  Br/ Gen/ Rectal- Not done, not indicated Extrem- cyanosis- none, clubbing, none, atrophy- none, strength- nl Neuro- grossly intact to observation

## 2017-08-23 ENCOUNTER — Other Ambulatory Visit: Payer: Self-pay | Admitting: Cardiovascular Disease

## 2017-08-24 NOTE — Telephone Encounter (Signed)
REFILL 

## 2017-09-22 ENCOUNTER — Other Ambulatory Visit: Payer: Self-pay | Admitting: Cardiovascular Disease

## 2017-09-22 DIAGNOSIS — I119 Hypertensive heart disease without heart failure: Secondary | ICD-10-CM

## 2017-10-19 ENCOUNTER — Other Ambulatory Visit: Payer: Self-pay | Admitting: Cardiovascular Disease

## 2017-10-19 NOTE — Telephone Encounter (Signed)
Rx(s) sent to pharmacy electronically.  

## 2017-11-03 ENCOUNTER — Ambulatory Visit: Payer: Medicare Other | Admitting: Cardiovascular Disease

## 2017-12-14 ENCOUNTER — Encounter: Payer: Self-pay | Admitting: Cardiovascular Disease

## 2017-12-14 ENCOUNTER — Ambulatory Visit: Payer: Medicare Other | Admitting: Cardiovascular Disease

## 2017-12-14 VITALS — BP 142/74 | HR 49 | Ht 67.0 in | Wt 196.2 lb

## 2017-12-14 DIAGNOSIS — I119 Hypertensive heart disease without heart failure: Secondary | ICD-10-CM | POA: Diagnosis not present

## 2017-12-14 DIAGNOSIS — E7800 Pure hypercholesterolemia, unspecified: Secondary | ICD-10-CM

## 2017-12-14 DIAGNOSIS — Z955 Presence of coronary angioplasty implant and graft: Secondary | ICD-10-CM

## 2017-12-14 MED ORDER — CARVEDILOL 12.5 MG PO TABS: ORAL_TABLET | ORAL | 5 refills | Status: DC

## 2017-12-14 NOTE — Progress Notes (Signed)
Cardiology Office Note   Date:  12/14/2017   ID:  [name], [name] 1946/02/25, MRN [id number]  PCP:  [name], MD  Cardiologist:   [name], MD   No chief complaint on file.    History of Present Illness: Douglas [name] is a 72 y.o. male with CAD s/p MI, hypertension, hyperlipidemia, chronic occult GI bleed, and OSA on CPAP who presents for follow up.  Douglas [name] was previously a patient of [name].  He had an anterior MI 10/2005.  He had nuclear stress tests 05/2009 and 10/2012 that were negative for ischemia.  At previous appointments Carvedilol was reduced due to bradycardia. He has been doing well recently. He denies any chest pain, lightheadedness, or dizziness. He has mild shortness of breath with exertion that is unchanged from baseline. He denies any lower extremity edema, orthopnea, or PND. He has not needed to take any Lasix since his last appointment.  Douglas [name] has not been exercising regularly.  He does work in his garden and he works with 6 horses.  He denies exertional symptoms with these activities.    Douglas [name] wife was in for her appointment when she noted that his blood pressure had been running in the 90s over 60s.  He was directed to reduce amlodipine to 5 mg daily.  He brings a log of his blood pressure showing that it is been running mostly in the 110s to 130s before he takes his medication in the morning.  After taking his medicine it is always well-controlled.  He has not had any more episodes of very low blood pressures and has not had any lightheadedness or dizziness.  His heart rate at home has been in the 60s and 50s.  He has not had any lower extremity edema, orthopnea, or PND.  He has not been exercising much lately but is thinking about starting to walk again with his brother.   Past Medical History:  Diagnosis Date  . Anemia   . Arthritis    "joints pains; comes w/age" (05/09/2014)  . Coronary artery disease   . GERD  (gastroesophageal reflux disease)   . H/O hiatal hernia 1980  . Heart attack ([place]) 10/29/05   anteroseptal myocardial infaction Taxus stent to LAD  . History of blood transfusion 1980?   "while hospitalized w/kidney stones, tore my hiatal hernia"  . History of gout   . Hyperlipidemia   . Hypertension   . Ischemic heart disease   . Kidney stones   . Lower extremity edema 08/23/2015  . OSA on CPAP   . Skin cancer    "burnt most of them off; face, arms"    Past Surgical History:  Procedure Laterality Date  . CARDIOVASCULAR STRESS TEST Left 05/28/2009   EF 57% no reversible ischemia  . CATARACT EXTRACTION W/ INTRAOCULAR LENS IMPLANT Left 1997  . CORONARY ANGIOPLASTY WITH STENT PLACEMENT Bilateral 2007   "1"  . DOPPLER ECHOCARDIOGRAPHY  01/06/2007  . EYE SURGERY Left 1978   "injury"  . HERNIA REPAIR    . INGUINAL HERNIA REPAIR Bilateral 05/09/2014  . INGUINAL HERNIA REPAIR Bilateral 05/09/2014   Procedure: LAPAROSCOPIC BILATERAL INGUINAL HERNIA REPAIR;  Surgeon: [name], MD;  Location: [facility];  Service: General;  Laterality: Bilateral;  . INSERTION OF MESH N/A 05/09/2014   Procedure: INSERTION OF MESH;  Surgeon: [name], MD;  Location: [place];  Service: General;  Laterality: N/A;  INSERTION OF MESH TO ABDOMEN AND BILATERAL  GROIN  . SHOULDER ARTHROSCOPY W/ ROTATOR CUFF REPAIR Right 06/2010  . SKIN CANCER EXCISION Left    "arm"  . TONSILLECTOMY  1964  . UMBILICAL HERNIA REPAIR  05/09/2014  . UMBILICAL HERNIA REPAIR N/A 05/09/2014   Procedure: HERNIA REPAIR UMBILICAL ADULT;  Surgeon: [name], MD;  Location: [place];  Service: General;  Laterality: N/A;     Current Outpatient Medications  Medication Sig Dispense Refill  . acetaminophen (TYLENOL) 325 MG tablet Take 650 mg by mouth every 6 (six) hours as needed for headache (pain).    Marland Kitchen amLODipine (NORVASC) 10 MG tablet TAKE 1/2 TABLET BY MOUTH EVERY DAY 30 tablet 7  . aspirin 81 MG EC tablet Take 81 mg by mouth daily.      .  carvedilol (COREG) 12.5 MG tablet TAKE 1 TABLET BY MOUTH TWICE 60 tablet 5  . Cholecalciferol (VITAMIN D) 2000 UNITS tablet Take 2,000 Units by mouth daily.      . clopidogrel (PLAVIX) 75 MG tablet Take 1 tablet (75 mg total) by mouth daily. 30 tablet 6  . colchicine 0.6 MG tablet Take 0.6 mg by mouth 2 (two) times daily as needed (gout). Colcrys    . fexofenadine (ALLEGRA) 180 MG tablet Take 180 mg by mouth daily as needed for allergies.     . furosemide (LASIX) 40 MG tablet Take 1 tablet (40 mg total) by mouth daily as needed for fluid or edema. 30 tablet 5  . nitroGLYCERIN (NITROSTAT) 0.4 MG SL tablet Place 0.4 mg under the tongue every 5 (five) minutes as needed for chest pain.     . pantoprazole (PROTONIX) 40 MG tablet TAKE 1 TABLET BY MOUTH EVERY OTHER DAY 15 tablet 3  . ranitidine (ZANTAC) 75 MG tablet Take 75 mg by mouth 2 (two) times daily.    . simvastatin (ZOCOR) 20 MG tablet TAKE 1 TABLET BY MOUTH EVERYDAY AT BEDTIME 30 tablet 2  . valACYclovir (VALTREX) 1000 MG tablet Take 1,000 mg by mouth 2 (two) times daily as needed (fever blisters).      No current facility-administered medications for this visit.     Allergies:   Ace inhibitors; Lisinopril; and Losartan    Social History:  The patient  reports that he quit smoking about 49 years ago. His smoking use included cigarettes. He has a 5.00 pack-year smoking history. He quit smokeless tobacco use about 39 years ago.  His smokeless tobacco use included chew. He reports that he drinks alcohol. He reports that he does not use drugs.   Family History:  The patient's family history includes Diabetes in his father and mother; Heart disease in his father and mother.    ROS:  Please see the history of present illness.   Otherwise, review of systems are positive for none.   All other systems are reviewed and negative.    PHYSICAL EXAM: VS:  BP (!) 142/74   Pulse (!) 49   Ht 5\' 7"  (1.702 m)   Wt 196 lb 3.2 oz (89 kg)   BMI 30.73  kg/m  , BMI Body mass index is 30.73 kg/m. GENERAL:  Well appearing HEENT: Pupils equal round and reactive, fundi not visualized, oral mucosa unremarkable NECK:  No jugular venous distention, waveform within normal limits, carotid upstroke brisk and symmetric, no bruits, no thyromegaly LYMPHATICS:  No cervical adenopathy LUNGS:  Clear to auscultation bilaterally HEART:  RRR.  PMI not displaced or sustained,S1 and S2 within normal limits, no S3, no S4, no  clicks, no rubs, no murmurs ABD:  Flat, positive bowel sounds normal in frequency in pitch, no bruits, no rebound, no guarding, no midline pulsatile mass, no hepatomegaly, no splenomegaly EXT:  2 plus pulses throughout, no edema, no cyanosis no clubbing SKIN:  No rashes no nodules NEURO:  Cranial nerves II through XII grossly intact, motor grossly intact throughout PSYCH:  Cognitively intact, oriented to person place and time   EKG:  EKG is not ordered today. 12/27/15: Sinus bradycardia.  Rate 49 bpm.  Prior septal infarct.  Prior lateral infarct 09/29/16: Sinus bradycardia. Rate 54 bpm. Left anterior fascicular block. Prior septal infarct. 04/24/17: Sinus bradycardia.  Rate 53 bpm.  Prior anterior infarct.  Prior lateral infarct.    Recent Labs: No results found for requested labs within last 8760 hours.   08/17/15: WBC 5.2, hematocrit 47.8, hemoglobin 16.4, platelets 172 TSH 2.33 Sodium 141, potassium 4.8, BUN 16, creatinine 0.99 Albumin 3.7 AST 16, ALT 21 Triglycerides 117, HDL 37, LDL 59, total cholesterol 119  09/29/16: Total cholesterol 112, triglycerides 106, HDL 43, LDL 48 WBC 5.8, hemoglobin 16.7, hematocrit 48.4, platelets 181 Sodium 137, potassium 4.5, BUN 17, creatinine 0.91 AST 11, ALT 20  08/11/2017: WBC 5.8, hemoglobin 16.5, hematocrit 48.1, platelets 167 Sodium 139, potassium 4.1, BUN 13, creatinine 0.919 AST 15, ALT 22 Total cholesterol 112, triglycerides 93, HDL 36, LDL 57 TSH 2.37  Lipid Panel    Component  Value Date/Time   CHOL 111 (L) 04/19/2015 0801   TRIG 84 04/19/2015 0801   HDL 30 (L) 04/19/2015 0801   CHOLHDL 3.7 04/19/2015 0801   VLDL 17 04/19/2015 0801   LDLCALC 64 04/19/2015 0801      Wt Readings from Last 3 Encounters:  12/14/17 196 lb 3.2 oz (89 kg)  08/19/17 196 lb 6.4 oz (89.1 kg)  04/24/17 198 lb (89.8 kg)      ASSESSMENT AND PLAN:  # Bradycardia: Heart rate stable.  He has no lightheadedness or dizziness.  Continue carvedilol 12.5mg  bid.  If he becomes symptomatic we will reduce carvedilol and increase amlodipine.    # Dizziness: Reduced since lowering amlodipine.    # LE edema: Improved.  He has not needed to use Lasix recently.  I suspect this is due to chronic venous insufficiency.   # CAD: Currently asymptomatic.  Continue aspirin, carvedilol, Plavix, and simvastatin. We discussed  stopping Plavix, as his heart attack was 10 years ago.  However, he wishes to continue it at this time.  He was encouraged to start back exercising at least 30-40 minutes most days of the week.  # Hypertension: Blood pressure is above goal here but has been controlled at home.  Continue carvedilol and amlodipine.  He will track at home and increase exercise.    # Hyperlipidemia: LDL 57 on 07/2017.  Continue simvastatin.  Increase exercise to at least 150 minutes per week.      Current medicines are reviewed at length with the patient today.  The patient does not have concerns regarding medicines.  The following changes have been made:  no change  Labs/ tests ordered today include:  No orders of the defined types were placed in this encounter.    Disposition:   FU with Tanaisha Pittman C. Oval Linsey, MD, [facility] in 6 months.     Signed, Vonzell Lindblad C. Oval Linsey, MD, [facility], [facility], [facility]  12/14/2017 11:48 AM    [place]

## 2017-12-14 NOTE — Patient Instructions (Signed)
Medication Instructions:  Your physician recommends that you continue on your current medications as directed. Please refer to the Current Medication list given to you today.  Labwork: NONE  Testing/Procedures: NONE  Follow-Up: Your physician wants you to follow-up in: 6 MONTHS  You will receive a reminder letter in the mail two months in advance. If you don't receive a letter, please call our office to schedule the follow-up appointment.  Any Other Special Instructions Will Be Listed Below (If Applicable).  TRY EXERCISING [place]   If you need a refill on your cardiac medications before your next appointment, please call your pharmacy.

## 2018-04-18 ENCOUNTER — Other Ambulatory Visit: Payer: Self-pay

## 2018-04-18 ENCOUNTER — Emergency Department (HOSPITAL_COMMUNITY)
Admission: EM | Admit: 2018-04-18 | Discharge: 2018-04-19 | Disposition: A | Discharge status: Home / Self Care | Payer: Medicare Other | Attending: Emergency Medicine | Admitting: Emergency Medicine

## 2018-04-18 ENCOUNTER — Encounter (HOSPITAL_COMMUNITY): Payer: Self-pay | Admitting: Emergency Medicine

## 2018-04-18 ENCOUNTER — Emergency Department (HOSPITAL_COMMUNITY): Payer: Medicare Other

## 2018-04-18 DIAGNOSIS — Z87891 Personal history of nicotine dependence: Secondary | ICD-10-CM | POA: Insufficient documentation

## 2018-04-18 DIAGNOSIS — Z7901 Long term (current) use of anticoagulants: Secondary | ICD-10-CM | POA: Diagnosis not present

## 2018-04-18 DIAGNOSIS — I480 Paroxysmal atrial fibrillation: Secondary | ICD-10-CM

## 2018-04-18 DIAGNOSIS — I259 Chronic ischemic heart disease, unspecified: Secondary | ICD-10-CM | POA: Diagnosis not present

## 2018-04-18 DIAGNOSIS — R002 Palpitations: Secondary | ICD-10-CM | POA: Insufficient documentation

## 2018-04-18 DIAGNOSIS — R197 Diarrhea, unspecified: Secondary | ICD-10-CM | POA: Insufficient documentation

## 2018-04-18 DIAGNOSIS — I10 Essential (primary) hypertension: Secondary | ICD-10-CM | POA: Insufficient documentation

## 2018-04-18 DIAGNOSIS — R1032 Left lower quadrant pain: Secondary | ICD-10-CM | POA: Diagnosis not present

## 2018-04-18 DIAGNOSIS — Z79899 Other long term (current) drug therapy: Secondary | ICD-10-CM | POA: Insufficient documentation

## 2018-04-18 DIAGNOSIS — R0602 Shortness of breath: Secondary | ICD-10-CM | POA: Diagnosis present

## 2018-04-18 LAB — BASIC METABOLIC PANEL
Anion gap: 9 (ref 5–15)
BUN: 19 mg/dL (ref 8–23)
CO2: 28 mmol/L (ref 22–32)
Calcium: 9.5 mg/dL (ref 8.9–10.3)
Chloride: 102 mmol/L (ref 98–111)
Creatinine, Ser: 1.12 mg/dL (ref 0.61–1.24)
GFR calc Af Amer: >60 mL/min (ref >60)
GFR calc non Af Amer: >60 mL/min (ref >60)
Glucose, Bld: 134 mg/dL — ABNORMAL HIGH (ref 70–99)
Potassium: 4 mmol/L (ref 3.5–5.1)
Sodium: 139 mmol/L (ref 135–145)

## 2018-04-18 LAB — URINALYSIS, ROUTINE W REFLEX MICROSCOPIC
Bacteria, UA: NONE SEEN
Bilirubin Urine: NEGATIVE
Glucose, UA: NEGATIVE mg/dL
Ketones, ur: NEGATIVE mg/dL
Leukocytes, UA: NEGATIVE
Nitrite: NEGATIVE
Protein, ur: NEGATIVE mg/dL
Specific Gravity, Urine: 1.013 (ref 1.005–1.030)
pH: 5 (ref 5.0–8.0)

## 2018-04-18 LAB — I-STAT TROPONIN, ED: Troponin i, poc: 0.01 ng/mL (ref 0.00–0.08)

## 2018-04-18 LAB — CBC
HCT: 52.6 % — ABNORMAL HIGH (ref 39.0–52.0)
Hemoglobin: 18 g/dL — ABNORMAL HIGH (ref 13.0–17.0)
MCH: 31.2 pg (ref 26.0–34.0)
MCHC: 34.2 g/dL (ref 30.0–36.0)
MCV: 91.2 fL (ref 80.0–100.0)
Platelets: 200 10*3/uL (ref 150–400)
RBC: 5.77 MIL/uL (ref 4.22–5.81)
RDW: 14.2 % (ref 11.5–15.5)
WBC: 11.5 10*3/uL — ABNORMAL HIGH (ref 4.0–10.5)
nRBC: 0 % (ref 0.0–0.2)

## 2018-04-18 LAB — BRAIN NATRIURETIC PEPTIDE: B Natriuretic Peptide: 319.3 pg/mL — ABNORMAL HIGH (ref 0.0–100.0)

## 2018-04-18 NOTE — ED Provider Notes (Signed)
[facility] EMERGENCY DEPARTMENT Provider Note   CSN: [id number] Arrival date & time: 04/18/18  2058     History   Chief Complaint Chief Complaint  Patient presents with  . Hypertension  . Tachycardia  . Shortness of Breath  . Flank Pain    HPI Douglas [name] is a 72 y.o. male.  Patient with history of MI status post stenting, on Plavix --presents to the emergency department with complaint of elevated blood pressure (517'G systolic) with associated shortness of breath and palpitations starting at approximately 8 PM.  Patient states that it felt like his heart was racing.  He denies any chest pain, diaphoresis, vomiting.  He has noted some pain in his left flank that changes with position.  He states he thinks it was from the way he was sitting on his couch.  No urinary symptoms or fever.  Recent nasal congestion without cough.  No previous history of atrial fibrillation.  No history of heart failure.  Dr. Oval Linsey is his cardiologist.  The onset of this condition was acute. The course is constant. Aggravating factors: none. Alleviating factors: none.       Past Medical History:  Diagnosis Date  . Anemia   . Arthritis    "joints pains; comes w/age" (05/09/2014)  . Coronary artery disease   . GERD (gastroesophageal reflux disease)   . H/O hiatal hernia 1980  . Heart attack ([place]) 10/29/05   anteroseptal myocardial infaction Taxus stent to LAD  . History of blood transfusion 1980?   "while hospitalized w/kidney stones, tore my hiatal hernia"  . History of gout   . Hyperlipidemia   . Hypertension   . Ischemic heart disease   . Kidney stones   . Lower extremity edema 08/23/2015  . OSA on CPAP   . Skin cancer    "burnt most of them off; face, arms"    Patient Active Problem List   Diagnosis Date Noted  . Lower extremity edema 08/23/2015  . Vitamin D deficiency 12/12/2014  . S/P bilateral inguinal herniorrhaphy 05/09/2014  . Cough 03/15/2014  .  Umbilical hernia 01/74/9449  . Obstructive sleep apnea 12/22/2012  . Atypical chest pain 11/06/2012  . Hiatal hernia 11/06/2012  . Stented coronary artery 11/06/2012  . Ischemic heart disease 10/01/2010  . Benign hypertensive heart disease without heart failure 10/01/2010  . Dyslipidemia 10/01/2010  . Iron deficiency anemia secondary to blood loss (chronic) 10/01/2010    Past Surgical History:  Procedure Laterality Date  . CARDIOVASCULAR STRESS TEST Left 05/28/2009   EF 57% no reversible ischemia  . CATARACT EXTRACTION W/ INTRAOCULAR LENS IMPLANT Left 1997  . CORONARY ANGIOPLASTY WITH STENT PLACEMENT Bilateral 2007   "1"  . DOPPLER ECHOCARDIOGRAPHY  01/06/2007  . EYE SURGERY Left 1978   "injury"  . HERNIA REPAIR    . INGUINAL HERNIA REPAIR Bilateral 05/09/2014  . INGUINAL HERNIA REPAIR Bilateral 05/09/2014   Procedure: LAPAROSCOPIC BILATERAL INGUINAL HERNIA REPAIR;  Surgeon: [name], MD;  Location: [place];  Service: General;  Laterality: Bilateral;  . INSERTION OF MESH N/A 05/09/2014   Procedure: INSERTION OF MESH;  Surgeon: [name], MD;  Location: [place];  Service: General;  Laterality: N/A;  INSERTION OF MESH TO ABDOMEN AND BILATERAL GROIN  . SHOULDER ARTHROSCOPY W/ ROTATOR CUFF REPAIR Right 06/2010  . SKIN CANCER EXCISION Left    "arm"  . TONSILLECTOMY  1964  . UMBILICAL HERNIA REPAIR  05/09/2014  . UMBILICAL HERNIA REPAIR N/A 05/09/2014  Procedure: HERNIA REPAIR UMBILICAL ADULT;  Surgeon: [name], MD;  Location: [place];  Service: General;  Laterality: N/A;        Home Medications    Prior to Admission medications   Medication Sig Start Date End Date Taking? Authorizing Provider  acetaminophen (TYLENOL) 325 MG tablet Take 650 mg by mouth every 6 (six) hours as needed for headache (pain).    [provider]  amLODipine (NORVASC) 10 MG tablet TAKE 1/2 TABLET BY MOUTH EVERY DAY 09/22/17   [name], MD  aspirin 81 MG EC tablet Take 81 mg by mouth daily.       [provider]  carvedilol (COREG) 12.5 MG tablet TAKE 1 TABLET BY MOUTH TWICE 12/14/17   [name], MD  Cholecalciferol (VITAMIN D) 2000 UNITS tablet Take 2,000 Units by mouth daily.      [provider]  clopidogrel (PLAVIX) 75 MG tablet Take 1 tablet (75 mg total) by mouth daily. 10/19/17   [name], MD  colchicine 0.6 MG tablet Take 0.6 mg by mouth 2 (two) times daily as needed (gout). Colcrys    [provider]  fexofenadine (ALLEGRA) 180 MG tablet Take 180 mg by mouth daily as needed for allergies.     [provider]  furosemide (LASIX) 40 MG tablet Take 1 tablet (40 mg total) by mouth daily as needed for fluid or edema. 08/23/15   [name], MD  nitroGLYCERIN (NITROSTAT) 0.4 MG SL tablet Place 0.4 mg under the tongue every 5 (five) minutes as needed for chest pain.     [provider]  pantoprazole (PROTONIX) 40 MG tablet TAKE 1 TABLET BY MOUTH EVERY OTHER DAY 07/24/15   [name], MD  ranitidine (ZANTAC) 75 MG tablet Take 75 mg by mouth 2 (two) times daily.    [provider]  simvastatin (ZOCOR) 20 MG tablet TAKE 1 TABLET BY MOUTH EVERYDAY AT BEDTIME 08/24/17   [name], MD  valACYclovir (VALTREX) 1000 MG tablet Take 1,000 mg by mouth 2 (two) times daily as needed (fever blisters).     [provider]    Family History Family History  Problem Relation Age of Onset  . Heart disease Mother   . Diabetes Mother   . Heart disease Father   . Diabetes Father     Social History Social History   Tobacco Use  . Smoking status: Former Smoker    Packs/day: 0.50    Years: 10.00    Pack years: 5.00    Types: Cigarettes    Last attempt to quit: 04/21/1968    Years since quitting: 50.0  . Smokeless tobacco: Former Systems developer    Types: [place] date: 04/21/1978  Substance Use Topics  . Alcohol use: Yes    Comment: 05/09/2014 "stopped drinking in ~ 2007; never drank much"  . Drug use: No      Allergies   Ace inhibitors; Lisinopril; and Losartan   Review of Systems Review of Systems  Constitutional: Negative for fever.  HENT: Negative for rhinorrhea and sore throat.   Eyes: Negative for redness.  Respiratory: Positive for shortness of breath. Negative for cough.   Cardiovascular: Positive for palpitations. Negative for chest pain.  Gastrointestinal: Positive for diarrhea (after ED arrival). Negative for abdominal pain, nausea and vomiting.  Genitourinary: Positive for flank pain. Negative for dysuria.  Musculoskeletal: Negative for myalgias.  Skin: Negative for rash.  Neurological: Negative for headaches.     Physical Exam  Updated Vital Signs BP (!) 141/98 (BP Location: Left Arm)   Pulse 100   Temp 98.5 F (36.9 C) (Oral)   Resp 18   SpO2 94%   Physical Exam Vitals signs and nursing note reviewed.  Constitutional:      Appearance: He is well-developed. He is not diaphoretic.  HENT:     Head: Normocephalic and atraumatic.     Mouth/Throat:     Mouth: Mucous membranes are not dry.  Eyes:     Conjunctiva/sclera: Conjunctivae normal.  Neck:     Musculoskeletal: Normal range of motion and neck supple. No muscular tenderness.     Vascular: Normal carotid pulses. No carotid bruit or JVD.     Trachea: Trachea normal. No tracheal deviation.  Cardiovascular:     Rate and Rhythm: Normal rate. Rhythm regularly irregular.     Pulses: No decreased pulses.          Radial pulses are 2+ on the right side and 2+ on the left side.     Heart sounds: Normal heart sounds, S1 normal and S2 normal. Heart sounds not distant. No murmur.  Pulmonary:     Effort: Pulmonary effort is normal. No respiratory distress.     Breath sounds: Normal breath sounds. No wheezing.  Chest:     Chest wall: No tenderness.  Abdominal:     General: Bowel sounds are normal.     Palpations: Abdomen is soft.     Tenderness: There is no abdominal tenderness. There is no guarding or rebound.   Musculoskeletal:     Right lower leg: He exhibits no tenderness. No edema.     Left lower leg: He exhibits no tenderness. No edema.  Skin:    General: Skin is warm and dry.     Coloration: Skin is not pale.  Neurological:     Mental Status: He is alert.      ED Treatments / Results  Labs (all labs ordered are listed, but only abnormal results are displayed) Labs Reviewed  BASIC METABOLIC PANEL - Abnormal; Notable for the following components:      Result Value   Glucose, Bld 134 (*)    All other components within normal limits  CBC - Abnormal; Notable for the following components:   WBC 11.5 (*)    Hemoglobin 18.0 (*)    HCT 52.6 (*)    All other components within normal limits  URINALYSIS, ROUTINE W REFLEX MICROSCOPIC - Abnormal; Notable for the following components:   Hgb urine dipstick SMALL (*)    All other components within normal limits  BRAIN NATRIURETIC PEPTIDE - Abnormal; Notable for the following components:   B Natriuretic Peptide 319.3 (*)    All other components within normal limits  I-STAT TROPONIN, ED    EKG EKG Interpretation  Date/Time:  Sunday April 18 2018 21:04:26 EST Ventricular Rate:  95 PR Interval:    QRS Duration: 82 QT Interval:  328 QTC Calculation: 412 R Axis:   26 Text Interpretation:  Atrial fibrillation Septal infarct , age undetermined Lateral infarct , age undetermined Abnormal ECG Confirmed by Quintella Reichert [phone]) on 04/18/2018 10:12:04 PM   Radiology Dg Chest 2 View  Result Date: 04/18/2018 CLINICAL DATA:  Dyspnea and tachycardia with nausea x1 day. EXAM: CHEST - 2 VIEW COMPARISON:  11/05/2012 FINDINGS: Top-normal heart size with slight uncoiling of the thoracic aorta. No acute pulmonary consolidation. Left basilar atelectasis is noted. Redemonstration of large hiatal hernia containing and air-fluid level, unchanged  in appearance. No effusion or pneumothorax. No acute osseous abnormality. IMPRESSION: Large hiatal hernia. No  active pulmonary disease. Electronically Signed   By: Ashley Royalty M.D.   On: 04/18/2018 21:33    Procedures Procedures (including critical care time)  Medications Ordered in ED Medications - No data to display   Initial Impression / Assessment and Plan / ED Course  I have reviewed the triage vital signs and the nursing notes.  Pertinent labs & imaging results that were available during my care of the patient were reviewed by me and considered in my medical decision making (see chart for details).     Patient seen and examined. Work-up initiated. New onset afib.   Vital signs reviewed and are as follows: BP (!) 141/98 (BP Location: Left Arm)   Pulse 100   Temp 98.5 F (36.9 C) (Oral)   Resp 18   SpO2 94%   Patient discussed with Dr. Ralene Bathe who will see patient.  12:08 AM patient spontaneously converted to normal sinus rhythm.  He is asymptomatic.   EKG Interpretation  Date/Time:  Sunday April 18 2018 23:44:18 EST Ventricular Rate:  68 PR Interval:    QRS Duration: 102 QT Interval:  405 QTC Calculation: 431 R Axis:   -66 Text Interpretation:  Sinus rhythm LAD, consider left anterior fascicular block Low voltage, precordial leads Abnormal lateral Q waves Anteroseptal infarct, old Confirmed by Quintella Reichert [phone]) on 04/18/2018 11:46:10 PM      This patients CHA2DS2-VASc Score and unadjusted Ischemic Stroke Rate (% per year) is equal to 3.2 % stroke rate/year from a score of 3  Above score calculated as 1 point each if present [CHF, HTN, DM, Vascular=MI/PAD/Aortic Plaque, Age if 65-74, or Male] Above score calculated as 2 points each if present [Age > 75, or Stroke/TIA/TE]  Plan for discharge home at this time.  Had a discussion with the patient regarding his anticoagulation status.  He does warrant anticoagulation however is currently on Plavix and aspirin.  He does have a history of possible GI bleeding.  We will leave anticoagulation decision up to his  cardiologist/A. fib clinic.  I have placed a ambulatory referral.  Discussed return discharge instructions including development of chest pain, shortness of breath, lightheadedness or syncope.  Patient has a blood pressure monitor at home and we discussed how to check of his pulses are regular.  He seems to feel confident with going home at this point.  Of note, patient and wife had a daughter who dealt with atrial fibrillation for 9 years and they are very well versed in treatment and management of this disease.   Final Clinical Impressions(s) / ED Diagnoses   Final diagnoses:  Paroxysmal atrial fibrillation ([place])  Palpitations   Patient with palpitations, flank pain, and shortness of breath today.  Patient was found to be in rate controlled atrial fibrillation on arrival.  This is new for him.  While in the emergency department he spontaneously converted to a normal sinus rhythm.  He is asymptomatic.  No abdominal findings suggestive of aortic aneurysm or dissection today.  Patient is on Plavix and aspirin currently due to previous MI.  His previous cardiologist, [name], maintained him on this regimen and he continues to take this.  BNP elevated slightly.  Patient without signs of overt fluid overload.  Patient has Lasix to use as needed if he has increasing ankle swelling however is asymptomatic and I would not have him take Lasix just based on his value  here today.  Seems reliable to return if worsening and follow-up for management.   ED Discharge Orders         Ordered    Amb referral to AFIB Clinic     04/19/18 0024           Carlisle Cater, Hershal Coria 04/19/18 0038    Quintella Reichert, MD 04/20/18 [phone]

## 2018-04-18 NOTE — ED Triage Notes (Signed)
C/o high BP, heart racing, and SOB since this morning.  Reports L flank pain x 1-2 hours.  Denies nausea, vomiting, or urinary complaints.

## 2018-04-19 NOTE — Discharge Instructions (Signed)
Please read and follow all provided instructions.  Your diagnoses today include:  1. Paroxysmal atrial fibrillation (HCC)   2. Palpitations    Tests performed today include:  An EKG of your heart - shows atrial fibrillation  A chest x-ray  Cardiac enzymes - a blood test for heart muscle damage  Blood counts and electrolytes  Vital signs. See below for your results today.   Medications prescribed:   None  Take any prescribed medications only as directed.  Follow-up instructions: Please follow-up with your cardiologist and/or the atrial fibrillation clinic this week.  Return instructions:  SEEK IMMEDIATE MEDICAL ATTENTION IF:  You have severe chest pain, especially if the pain is crushing or pressure-like and spreads to the arms, back, neck, or jaw, or if you have sweating, nausea (feeling sick to your stomach), or shortness of breath. THIS IS AN EMERGENCY. Don't wait to see if the pain will go away. Get medical help at once. Call 911 or 0 (operator). DO NOT drive yourself to the hospital.   Your chest pain gets worse and does not go away with rest.   You have an attack of chest pain lasting longer than usual, despite rest and treatment with the medications your caregiver has prescribed.   You wake from sleep with chest pain or shortness of breath.  You feel dizzy or faint.  You have chest pain not typical of your usual pain for which you originally saw your caregiver.   You have any other emergent concerns regarding your health.  Additional Information: Chest pain comes from many different causes. Your caregiver has diagnosed you as having chest pain that is not specific for one problem, but does not require admission.  You are at low risk for an acute heart condition or other serious illness.   Your vital signs today were: BP 110/82    Pulse 64    Temp 98.5 F (36.9 C) (Oral)    Resp (!) 24    SpO2 95%  If your blood pressure (BP) was elevated above 135/85 this  visit, please have this repeated by your doctor within one month. --------------

## 2018-04-23 ENCOUNTER — Ambulatory Visit (HOSPITAL_COMMUNITY)
Admission: RE | Admit: 2018-04-23 | Discharge: 2018-04-23 | Disposition: A | Discharge status: Home / Self Care | Payer: Medicare Other | Source: Ambulatory Visit | Attending: Nurse Practitioner | Admitting: Nurse Practitioner

## 2018-04-23 ENCOUNTER — Encounter (HOSPITAL_COMMUNITY): Payer: Self-pay | Admitting: Nurse Practitioner

## 2018-04-23 VITALS — BP 118/82 | HR 61 | Ht 67.0 in | Wt 201.0 lb

## 2018-04-23 DIAGNOSIS — I2510 Atherosclerotic heart disease of native coronary artery without angina pectoris: Secondary | ICD-10-CM | POA: Insufficient documentation

## 2018-04-23 DIAGNOSIS — I4891 Unspecified atrial fibrillation: Secondary | ICD-10-CM | POA: Diagnosis present

## 2018-04-23 DIAGNOSIS — Z87891 Personal history of nicotine dependence: Secondary | ICD-10-CM | POA: Insufficient documentation

## 2018-04-23 DIAGNOSIS — M1990 Unspecified osteoarthritis, unspecified site: Secondary | ICD-10-CM | POA: Insufficient documentation

## 2018-04-23 DIAGNOSIS — I252 Old myocardial infarction: Secondary | ICD-10-CM | POA: Insufficient documentation

## 2018-04-23 DIAGNOSIS — Z79899 Other long term (current) drug therapy: Secondary | ICD-10-CM | POA: Insufficient documentation

## 2018-04-23 DIAGNOSIS — Z7982 Long term (current) use of aspirin: Secondary | ICD-10-CM | POA: Diagnosis not present

## 2018-04-23 DIAGNOSIS — Z7901 Long term (current) use of anticoagulants: Secondary | ICD-10-CM | POA: Diagnosis not present

## 2018-04-23 DIAGNOSIS — I480 Paroxysmal atrial fibrillation: Secondary | ICD-10-CM

## 2018-04-23 DIAGNOSIS — M109 Gout, unspecified: Secondary | ICD-10-CM | POA: Insufficient documentation

## 2018-04-23 DIAGNOSIS — K219 Gastro-esophageal reflux disease without esophagitis: Secondary | ICD-10-CM | POA: Insufficient documentation

## 2018-04-23 DIAGNOSIS — I259 Chronic ischemic heart disease, unspecified: Secondary | ICD-10-CM | POA: Insufficient documentation

## 2018-04-23 DIAGNOSIS — Z7902 Long term (current) use of antithrombotics/antiplatelets: Secondary | ICD-10-CM | POA: Insufficient documentation

## 2018-04-23 DIAGNOSIS — G4733 Obstructive sleep apnea (adult) (pediatric): Secondary | ICD-10-CM | POA: Diagnosis not present
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
## 2018-04-28 ENCOUNTER — Ambulatory Visit (INDEPENDENT_AMBULATORY_CARE_PROVIDER_SITE_OTHER): Payer: Medicare Other

## 2018-04-28 DIAGNOSIS — I480 Paroxysmal atrial fibrillation: Secondary | ICD-10-CM

## 2018-05-03 ENCOUNTER — Encounter: Payer: Self-pay | Admitting: Cardiology

## 2018-05-03 ENCOUNTER — Ambulatory Visit: Payer: Medicare Other | Admitting: Cardiology

## 2018-05-03 VITALS — BP 120/74 | HR 60 | Ht 67.0 in | Wt 202.5 lb

## 2018-05-03 DIAGNOSIS — D509 Iron deficiency anemia, unspecified: Secondary | ICD-10-CM

## 2018-05-03 DIAGNOSIS — I2510 Atherosclerotic heart disease of native coronary artery without angina pectoris: Secondary | ICD-10-CM | POA: Diagnosis not present

## 2018-05-03 DIAGNOSIS — I4891 Unspecified atrial fibrillation: Secondary | ICD-10-CM | POA: Diagnosis not present

## 2018-05-03 DIAGNOSIS — G4733 Obstructive sleep apnea (adult) (pediatric): Secondary | ICD-10-CM

## 2018-05-03 DIAGNOSIS — I480 Paroxysmal atrial fibrillation: Secondary | ICD-10-CM | POA: Diagnosis not present

## 2018-05-03 DIAGNOSIS — Z9861 Coronary angioplasty status: Secondary | ICD-10-CM

## 2018-05-03 LAB — CBC
Hematocrit: 43 % (ref 37.5–51.0)
Hemoglobin: 15.1 g/dL (ref 13.0–17.7)
MCH: 31.7 pg (ref 26.6–33.0)
MCHC: 35.1 g/dL (ref 31.5–35.7)
MCV: 90 fL (ref 79–97)
Platelets: 170 10*3/uL (ref 150–450)
RBC: 4.77 x10E6/uL (ref 4.14–5.80)
RDW: 13.2 % (ref 11.6–15.4)
WBC: 5.1 10*3/uL (ref 3.4–10.8)

## 2018-05-03 NOTE — Patient Instructions (Addendum)
Medication Instructions:  Your Physician recommend you continue on your current medication as directed.    If you need a refill on your cardiac medications before your next appointment, please call your pharmacy.   Lab work: Your physician recommends that you return for lab work today (TSH, Mg, CBC)    Testing/Procedures: None  Follow-Up: Your physician recommends that you keep scheduled appointment with Dr. Oval Linsey

## 2018-05-03 NOTE — Assessment & Plan Note (Signed)
Pt says this was picked up by the [place]- he had an unremarkable GI work up by Dr Oletta Lamas in 2015 and is no longer on iron. His Hgb was actually 18 in Dec - with a WBC of 11-(? Myelodysplastic syndrome)

## 2018-05-03 NOTE — Assessment & Plan Note (Signed)
Anterior MI- LAD PCI 2007 Myoview low risk 2011 and July 2014

## 2018-05-03 NOTE — Assessment & Plan Note (Signed)
On C-pap 
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
## 2018-05-03 NOTE — Assessment & Plan Note (Signed)
Pt seen in the ED Dec 2019 with new onset AF- rate 92. He has since converted spontaneously and in fact was only documented to be in AF for a couple of hours.

## 2018-05-03 NOTE — Progress Notes (Signed)
05/03/2018 Douglas [name]   1945-12-19  [id number]  Primary Physician [name], MD Primary Cardiologist: Dr Oval Linsey  HPI:  73 y.o. male, [name] and former patient of Dr Mare Ferrari, with a history of CAD s/p MI in 2007 treated with LAD PCI. He had nuclear stress tests 05/2009 and 10/2012 that were negative for ischemia. Other medical problems include hypertension, hyperlipidemia, chronic occult GI bleed, and OSA on CPAP.  The patient presented to the ED 04/18/18 with 2 hours of palpitations and found to be in new onset afib, rate controlled. He self converted in the ER. Since he was already on asa/plavix for CAD, he was not started on anticoagulation. He was referred to the afib clinic 04/23/18 for further evaluation. He was in NSR then. It was decided to place a 30 day monitor to assess his AF burden before committing him to anticoagulation. He is in the office today for follow up.   The pt tells me he discovered the AF after he checked his B/P at home and noted his HR was elevated. He said he "just didn't feel right" but denied palpitations, SOB, or chest pain. Today in the office he is asymptomatic- in NSR 60.    Current Outpatient Medications  Medication Sig Dispense Refill  . amLODipine (NORVASC) 10 MG tablet TAKE 1/2 TABLET BY MOUTH EVERY DAY (Patient taking differently: Take 5 mg by mouth daily. ) 30 tablet 7  . aspirin 81 MG EC tablet Take 81 mg by mouth daily.      . carvedilol (COREG) 12.5 MG tablet TAKE 1 TABLET BY MOUTH TWICE (Patient taking differently: Take 12.5 mg by mouth 2 (two) times daily with a meal. ) 60 tablet 5  . Cholecalciferol (VITAMIN D) 2000 UNITS tablet Take 2,000 Units by mouth daily.      . clopidogrel (PLAVIX) 75 MG tablet Take 1 tablet (75 mg total) by mouth daily. 30 tablet 6  . colchicine 0.6 MG tablet Take 0.6 mg by mouth 2 (two) times daily as needed (gout). Colcrys    . fexofenadine (ALLEGRA) 180 MG tablet Take 180 mg by mouth daily as  needed for allergies.     . furosemide (LASIX) 40 MG tablet Take 1 tablet (40 mg total) by mouth daily as needed for fluid or edema. 30 tablet 5  . nitroGLYCERIN (NITROSTAT) 0.4 MG SL tablet Place 0.4 mg under the tongue every 5 (five) minutes as needed for chest pain.     . ranitidine (ZANTAC) 150 MG tablet Take 150 mg by mouth at bedtime.     . simvastatin (ZOCOR) 20 MG tablet TAKE 1 TABLET BY MOUTH EVERYDAY AT BEDTIME (Patient taking differently: Take 20 mg by mouth daily at 6 PM. ) 30 tablet 2  . valACYclovir (VALTREX) 1000 MG tablet Take 1,000 mg by mouth 2 (two) times daily as needed (fever blisters).      No current facility-administered medications for this visit.     Allergies  Allergen Reactions  . Ace Inhibitors Cough  . Lisinopril Cough  . Losartan Cough    Past Medical History:  Diagnosis Date  . Anemia   . Arthritis    "joints pains; comes w/age" (05/09/2014)  . Coronary artery disease   . GERD (gastroesophageal reflux disease)   . H/O hiatal hernia 1980  . Heart attack ([place]) 10/29/05   anteroseptal myocardial infaction Taxus stent to LAD  . History of blood transfusion 1980?   "while hospitalized w/kidney stones,  tore my hiatal hernia"  . History of gout   . Hyperlipidemia   . Hypertension   . Ischemic heart disease   . Kidney stones   . Lower extremity edema 08/23/2015  . OSA on CPAP   . Skin cancer    "burnt most of them off; face, arms"    Social History   Socioeconomic History  . Marital status: Married    Spouse name: Not on file  . Number of children: Not on file  . Years of education: Not on file  . Highest education level: Not on file  Occupational History  . Not on file  Social Needs  . Financial resource strain: Not on file  . Food insecurity:    Worry: Not on file    Inability: Not on file  . Transportation needs:    Medical: Not on file    Non-medical: Not on file  Tobacco Use  . Smoking status: Former Smoker    Packs/day: 0.50     Years: 10.00    Pack years: 5.00    Types: Cigarettes    Last attempt to quit: 04/21/1968    Years since quitting: 50.0  . Smokeless tobacco: Former Systems developer    Types: [employer] date: 04/21/1978  Substance and Sexual Activity  . Alcohol use: Yes    Comment: 05/09/2014 "stopped drinking in ~ 2007; never drank much"  . Drug use: No  . Sexual activity: Yes  Lifestyle  . Physical activity:    Days per week: Not on file    Minutes per session: Not on file  . Stress: Not on file  Relationships  . Social connections:    Talks on phone: Not on file    Gets together: Not on file    Attends religious service: Not on file    Active member of club or organization: Not on file    Attends meetings of clubs or organizations: Not on file    Relationship status: Not on file  . Intimate partner violence:    Fear of current or ex partner: Not on file    Emotionally abused: Not on file    Physically abused: Not on file    Forced sexual activity: Not on file  Other Topics Concern  . Not on file  Social History Narrative  . Not on file     Family History  Problem Relation Age of Onset  . Heart disease Mother   . Diabetes Mother   . Heart disease Father   . Diabetes Father      Review of Systems: General: negative for chills, fever, night sweats or weight changes.  Cardiovascular: negative for chest pain, dyspnea on exertion, edema, orthopnea, palpitations, paroxysmal nocturnal dyspnea or shortness of breath Dermatological: negative for rash Respiratory: negative for cough or wheezing Urologic: negative for hematuria Abdominal: negative for nausea, vomiting, diarrhea, bright red blood per rectum, melena, or hematemesis Neurologic: negative for visual changes, syncope, or dizziness All other systems reviewed and are otherwise negative except as noted above.    Blood pressure 120/74, pulse 60, height 5\' 7"  (1.702 m), weight 202 lb 8 oz (91.9 kg), SpO2 96 %.  General appearance: alert,  cooperative, no distress and moderately obese Neck: no carotid bruit and no JVD Lungs: clear to auscultation bilaterally Heart: regular rate and rhythm Extremities: no edema Skin: warm and dry Neurologic: Grossly normal  EKG NSR- (machine broken did not print but I reviewed)  ASSESSMENT AND PLAN:  Atrial fibrillation, new onset ([place]) Pt seen in the ED Dec 2019 with new onset AF- rate 92. He has since converted spontaneously and in fact was only documented to be in AF for a couple of hours.  CAD S/P percutaneous coronary angioplasty Anterior MI- LAD PCI 2007 Myoview low risk 2011 and July 2014  Obstructive sleep apnea On C-pap  Iron deficiency anemia Pt says this was picked up by the [place]- he had an unremarkable GI work up by Dr Oletta Lamas in 2015 and is no longer on iron. His Hgb was actually 18 in Dec - with a WBC of 11-(? Myelodysplastic syndrome)   PLAN  He has his monitor in place. Check TSH, Mg++, and CBC. Echo is scheduled for tomorrow. F/U with Dr Oval Linsey in Feb as scheduled.  His CHADS VASC is 3. He could certainly stop Plavix if its determined he needs anticoagulation.   Kerin Ransom PA-C 05/03/2018 9:18 AM

## 2018-05-04 ENCOUNTER — Ambulatory Visit (HOSPITAL_COMMUNITY)
Admission: RE | Admit: 2018-05-04 | Discharge: 2018-05-04 | Disposition: A | Discharge status: Home / Self Care | Payer: Medicare Other | Source: Ambulatory Visit | Attending: Nurse Practitioner | Admitting: Nurse Practitioner

## 2018-05-04 ENCOUNTER — Other Ambulatory Visit: Payer: Self-pay | Admitting: Cardiovascular Disease

## 2018-05-04 DIAGNOSIS — I272 Pulmonary hypertension, unspecified: Secondary | ICD-10-CM | POA: Insufficient documentation

## 2018-05-04 DIAGNOSIS — I071 Rheumatic tricuspid insufficiency: Secondary | ICD-10-CM | POA: Diagnosis not present

## 2018-05-04 DIAGNOSIS — I480 Paroxysmal atrial fibrillation: Secondary | ICD-10-CM | POA: Diagnosis not present

## 2018-05-04 DIAGNOSIS — I5021 Acute systolic (congestive) heart failure: Secondary | ICD-10-CM

## 2018-05-04 LAB — TSH: TSH: 2.53 u[IU]/mL (ref 0.450–4.500)

## 2018-05-04 LAB — MAGNESIUM: Magnesium: 2.2 mg/dL (ref 1.6–2.3)

## 2018-05-04 NOTE — Progress Notes (Signed)
Echo shows a reduction in LVEF and he had NSVT on telemetry.  We will get a LHC to assess his coronaries.  Patient is amenable to this plan.  Risks and benefits of cardiac catheterization have been discussed with the patient.  The patient understands that risks included but are not limited to stroke (1 in 1000), death (1 in 33), kidney failure [usually temporary] (1 in 500), bleeding (1 in 200), allergic reaction [possibly serious] (1 in 200). The patient understands and agrees to proceed.   Tai Syfert C. Oval Linsey, MD, [facility]  05/04/2018 5:54 PM

## 2018-05-06 ENCOUNTER — Telehealth: Payer: Self-pay | Admitting: *Deleted

## 2018-05-06 ENCOUNTER — Encounter: Payer: Self-pay | Admitting: *Deleted

## 2018-05-06 NOTE — Telephone Encounter (Signed)
Dr Oval Linsey received Echo reports on patient and sinus brady w/run of V-Tach (5 beats)  noted on monitor 05/02/18, she reported to patient. She suggested to patient having cardiac cath, and reviewed with patient. Spoke with wife and reviewed instructions, date, time and location. He is scheduled with Dr Ellyn Hack 05/11/18 arrive at 7:00 am for 9:00 am procedure.

## 2018-05-10 ENCOUNTER — Telehealth: Payer: Self-pay | Admitting: *Deleted

## 2018-05-10 NOTE — Telephone Encounter (Signed)
Pt contacted pre-catheterization scheduled at [facility] for: Tuesday May 11, 2018 9 AM Verified arrival time and place: [place] En[facility] A at: 7 AM  No solid food after midnight prior to cath, clear liquids until 5 AM day of procedure.  Hold: Furosemide-AM of procedure.  Except hold medication AM meds can be  taken pre-cath with sip of water including: ASA 81 mg Clopidogrel 75 mg  Confirmed patient has responsible person to drive home post procedure and for 24 hours after you arrive home: yes

## 2018-05-11 ENCOUNTER — Other Ambulatory Visit: Payer: Self-pay

## 2018-05-11 ENCOUNTER — Encounter (HOSPITAL_COMMUNITY)
Admission: RE | Disposition: A | Discharge status: Home / Self Care | Payer: Self-pay | Source: Home / Self Care | Attending: Cardiology

## 2018-05-11 ENCOUNTER — Ambulatory Visit (HOSPITAL_COMMUNITY)
Admission: RE | Admit: 2018-05-11 | Discharge: 2018-05-11 | Disposition: A | Discharge status: Home / Self Care | Payer: Medicare Other | Attending: Cardiology | Admitting: Cardiology

## 2018-05-11 DIAGNOSIS — I472 Ventricular tachycardia: Secondary | ICD-10-CM | POA: Diagnosis present

## 2018-05-11 DIAGNOSIS — E785 Hyperlipidemia, unspecified: Secondary | ICD-10-CM | POA: Diagnosis not present

## 2018-05-11 DIAGNOSIS — I25118 Atherosclerotic heart disease of native coronary artery with other forms of angina pectoris: Secondary | ICD-10-CM | POA: Diagnosis not present

## 2018-05-11 DIAGNOSIS — Z888 Allergy status to other drugs, medicaments and biological substances status: Secondary | ICD-10-CM | POA: Diagnosis not present

## 2018-05-11 DIAGNOSIS — Z9861 Coronary angioplasty status: Secondary | ICD-10-CM

## 2018-05-11 DIAGNOSIS — I5021 Acute systolic (congestive) heart failure: Secondary | ICD-10-CM

## 2018-05-11 DIAGNOSIS — I252 Old myocardial infarction: Secondary | ICD-10-CM | POA: Diagnosis not present

## 2018-05-11 DIAGNOSIS — Z8249 Family history of ischemic heart disease and other diseases of the circulatory system: Secondary | ICD-10-CM | POA: Insufficient documentation

## 2018-05-11 DIAGNOSIS — Z955 Presence of coronary angioplasty implant and graft: Secondary | ICD-10-CM | POA: Diagnosis not present

## 2018-05-11 DIAGNOSIS — K219 Gastro-esophageal reflux disease without esophagitis: Secondary | ICD-10-CM | POA: Insufficient documentation

## 2018-05-11 DIAGNOSIS — Z79899 Other long term (current) drug therapy: Secondary | ICD-10-CM | POA: Diagnosis not present

## 2018-05-11 DIAGNOSIS — I480 Paroxysmal atrial fibrillation: Secondary | ICD-10-CM | POA: Diagnosis present

## 2018-05-11 DIAGNOSIS — D469 Myelodysplastic syndrome, unspecified: Secondary | ICD-10-CM | POA: Insufficient documentation

## 2018-05-11 DIAGNOSIS — I2510 Atherosclerotic heart disease of native coronary artery without angina pectoris: Secondary | ICD-10-CM | POA: Diagnosis not present

## 2018-05-11 DIAGNOSIS — I10 Essential (primary) hypertension: Secondary | ICD-10-CM | POA: Diagnosis not present

## 2018-05-11 DIAGNOSIS — Z7982 Long term (current) use of aspirin: Secondary | ICD-10-CM | POA: Diagnosis not present

## 2018-05-11 DIAGNOSIS — Z7902 Long term (current) use of antithrombotics/antiplatelets: Secondary | ICD-10-CM | POA: Insufficient documentation

## 2018-05-11 DIAGNOSIS — Z87891 Personal history of nicotine dependence: Secondary | ICD-10-CM | POA: Insufficient documentation

## 2018-05-11 DIAGNOSIS — M109 Gout, unspecified: Secondary | ICD-10-CM | POA: Insufficient documentation

## 2018-05-11 DIAGNOSIS — I4891 Unspecified atrial fibrillation: Secondary | ICD-10-CM | POA: Diagnosis not present

## 2018-05-11 DIAGNOSIS — M1990 Unspecified osteoarthritis, unspecified site: Secondary | ICD-10-CM | POA: Insufficient documentation

## 2018-05-11 DIAGNOSIS — I209 Angina pectoris, unspecified: Secondary | ICD-10-CM

## 2018-05-11 DIAGNOSIS — G4733 Obstructive sleep apnea (adult) (pediatric): Secondary | ICD-10-CM | POA: Diagnosis not present

## 2018-05-11 DIAGNOSIS — I259 Chronic ischemic heart disease, unspecified: Secondary | ICD-10-CM | POA: Diagnosis present

## 2018-05-11 DIAGNOSIS — I4729 Other ventricular tachycardia: Secondary | ICD-10-CM

## 2018-05-11 HISTORY — PX: LEFT HEART CATH AND CORONARY ANGIOGRAPHY: CATH118249

## 2018-05-11 HISTORY — PX: CORONARY STENT INTERVENTION: CATH118234

## 2018-05-11 LAB — POCT ACTIVATED CLOTTING TIME
Activated Clotting Time: 257 seconds
Activated Clotting Time: 351 seconds

## 2018-05-11 SURGERY — LEFT HEART CATH AND CORONARY ANGIOGRAPHY
Anesthesia: LOCAL

## 2018-05-11 MED ORDER — SODIUM CHLORIDE 0.9 % WEIGHT BASED INFUSION: 1.0000 mL/kg/h | INTRAVENOUS | Status: DC

## 2018-05-11 MED ORDER — SODIUM CHLORIDE 0.9% FLUSH: 3.0000 mL | INTRAVENOUS | Status: DC | PRN

## 2018-05-11 MED ORDER — HEPARIN SODIUM (PORCINE) 1000 UNIT/ML IJ SOLN
INTRAMUSCULAR | Status: DC | PRN
  Administered 2018-05-11: 3000 [IU] via INTRAVENOUS
  Administered 2018-05-11: 5000 [IU] via INTRAVENOUS
  Administered 2018-05-11: 4500 [IU] via INTRAVENOUS

## 2018-05-11 MED ORDER — FENTANYL CITRATE (PF) 100 MCG/2ML IJ SOLN
INTRAMUSCULAR | Status: DC | PRN
  Administered 2018-05-11: 25 ug via INTRAVENOUS

## 2018-05-11 MED ORDER — SODIUM CHLORIDE 0.9 % IV SOLN: 250.0000 mL | INTRAVENOUS | Status: DC | PRN

## 2018-05-11 MED ORDER — IOHEXOL 350 MG/ML SOLN
INTRAVENOUS | Status: DC | PRN
  Administered 2018-05-11: 135 mL via INTRACARDIAC

## 2018-05-11 MED ORDER — HEPARIN SODIUM (PORCINE) 1000 UNIT/ML IJ SOLN
INTRAMUSCULAR | Status: AC
  Filled 2018-05-11: qty 1

## 2018-05-11 MED ORDER — HEPARIN (PORCINE) IN NACL 1000-0.9 UT/500ML-% IV SOLN
INTRAVENOUS | Status: DC | PRN
  Administered 2018-05-11 (×2): 500 mL

## 2018-05-11 MED ORDER — FENTANYL CITRATE (PF) 100 MCG/2ML IJ SOLN
INTRAMUSCULAR | Status: AC
  Filled 2018-05-11: qty 2

## 2018-05-11 MED ORDER — ASPIRIN 81 MG PO CHEW: 81.0000 mg | CHEWABLE_TABLET | ORAL | Status: DC

## 2018-05-11 MED ORDER — HYDRALAZINE HCL 20 MG/ML IJ SOLN: 5.0000 mg | INTRAMUSCULAR | Status: DC | PRN

## 2018-05-11 MED ORDER — LABETALOL HCL 5 MG/ML IV SOLN: 10.0000 mg | INTRAVENOUS | Status: DC | PRN

## 2018-05-11 MED ORDER — ACETAMINOPHEN 325 MG PO TABS: 650.0000 mg | ORAL_TABLET | ORAL | Status: DC | PRN

## 2018-05-11 MED ORDER — LIDOCAINE HCL (PF) 1 % IJ SOLN
INTRAMUSCULAR | Status: DC | PRN
  Administered 2018-05-11: 2 mL via INTRADERMAL

## 2018-05-11 MED ORDER — ONDANSETRON HCL 4 MG/2ML IJ SOLN: 4.0000 mg | Freq: Four times a day (QID) | INTRAMUSCULAR | Status: DC | PRN

## 2018-05-11 MED ORDER — LIDOCAINE HCL (PF) 1 % IJ SOLN
INTRAMUSCULAR | Status: AC
  Filled 2018-05-11: qty 30

## 2018-05-11 MED ORDER — HEPARIN (PORCINE) IN NACL 1000-0.9 UT/500ML-% IV SOLN
INTRAVENOUS | Status: AC
  Filled 2018-05-11: qty 1000

## 2018-05-11 MED ORDER — SODIUM CHLORIDE 0.9% FLUSH: 3.0000 mL | Freq: Two times a day (BID) | INTRAVENOUS | Status: DC

## 2018-05-11 MED ORDER — MIDAZOLAM HCL 2 MG/2ML IJ SOLN
INTRAMUSCULAR | Status: AC
  Filled 2018-05-11: qty 2

## 2018-05-11 MED ORDER — SODIUM CHLORIDE 0.9 % WEIGHT BASED INFUSION
3.0000 mL/kg/h | INTRAVENOUS | Status: DC
  Administered 2018-05-11: 3 mL/kg/h via INTRAVENOUS

## 2018-05-11 MED ORDER — SODIUM CHLORIDE 0.9 % IV SOLN: INTRAVENOUS | Status: AC

## 2018-05-11 MED ORDER — VERAPAMIL HCL 2.5 MG/ML IV SOLN
INTRAVENOUS | Status: AC
  Filled 2018-05-11: qty 2

## 2018-05-11 MED ORDER — MORPHINE SULFATE (PF) 2 MG/ML IV SOLN: 2.0000 mg | INTRAVENOUS | Status: DC | PRN

## 2018-05-11 MED ORDER — MIDAZOLAM HCL 2 MG/2ML IJ SOLN
INTRAMUSCULAR | Status: DC | PRN
  Administered 2018-05-11: 1 mg via INTRAVENOUS

## 2018-05-11 MED ORDER — VERAPAMIL HCL 2.5 MG/ML IV SOLN
INTRAVENOUS | Status: DC | PRN
  Administered 2018-05-11: 10 mL via INTRA_ARTERIAL

## 2018-05-11 SURGICAL SUPPLY — 19 items
BALLN SAPPHIRE 2.5X15 (BALLOONS) ×2
BALLN SAPPHIRE ~~LOC~~ 3.5X12 (BALLOONS) ×1 IMPLANT
BALLOON SAPPHIRE 2.5X15 (BALLOONS) IMPLANT
CATH INFINITI 5FR ANG PIGTAIL (CATHETERS) ×1 IMPLANT
CATH OPTITORQUE TIG 4.0 5F (CATHETERS) ×2 IMPLANT
CATH VISTA GUIDE 6FR JR4 (CATHETERS) ×1 IMPLANT
DEVICE RAD COMP TR BAND LRG (VASCULAR PRODUCTS) ×1 IMPLANT
GLIDESHEATH SLEND SS 6F .021 (SHEATH) ×1 IMPLANT
GUIDEWIRE INQWIRE 1.5J.035X260 (WIRE) IMPLANT
INQWIRE 1.5J .035X260CM (WIRE) ×2
KIT ENCORE 26 ADVANTAGE (KITS) ×1 IMPLANT
KIT HEART LEFT (KITS) ×2 IMPLANT
PACK CARDIAC CATHETERIZATION (CUSTOM PROCEDURE TRAY) ×2 IMPLANT
SHEATH PROBE COVER 6X72 (BAG) ×1 IMPLANT
STENT ORSIRO 3.0X18 (Permanent Stent) ×2 IMPLANT
TRANSDUCER W/STOPCOCK (MISCELLANEOUS) ×2 IMPLANT
TUBING CIL FLEX 10 FLL-RA (TUBING) ×2 IMPLANT
WIRE ASAHI PROWATER 180CM (WIRE) ×1 IMPLANT
WIRE HI TORQ VERSACORE-J 145CM (WIRE) ×1 IMPLANT

## 2018-05-11 NOTE — Interval H&P Note (Signed)
History and Physical Interval Note:  05/11/2018 9:03 AM  Douglas [name]  has presented today for surgery, with the diagnosis of abnormal echo - cad / nsvt..   **  Signed     Echo shows a reduction in LVEF and he had NSVT on telemetry.  We will get a LHC to assess his coronaries.  Patient is amenable to this plan.  Risks and benefits of cardiac catheterization have been discussed with the patient.  The patient understands that risks included but are not limited to stroke (1 in 1000), death (1 in 35), kidney failure [usually temporary] (1 in 500), bleeding (1 in 200), allergic reaction [possibly serious] (1 in 200). The patient understands and agrees to proceed.   Tiffany C. Oval Linsey, MD, [facility]  05/04/2018 5:54 PM             The various methods of treatment have been discussed with the patient and family. After consideration of risks, benefits and other options for treatment, the patient has consented to  Procedure(s): LEFT HEART CATH AND CORONARY ANGIOGRAPHY (N/A) as a surgical intervention .  The patient's history has been reviewed, patient examined, no change in status, stable for surgery.  I have reviewed the patient's chart and labs.  Questions were answered to the patient's satisfaction.     Glenetta Hew

## 2018-05-11 NOTE — Progress Notes (Signed)
Ed completed with good comprehension. Wife is present. Understands importance of Plavix. Will refer to [place], ACSM 11:41 AM 05/11/2018

## 2018-05-11 NOTE — Discharge Summary (Signed)
Discharge Summary    Patient ID: Douglas [name] MRN: [id number]; DOB: [birth year]  Admit date: 05/11/2018 Discharge date: 05/11/2018  Primary Care Provider: [name], MD  Primary Cardiologist: Douglas Latch, MD   Discharge Diagnoses    Principal Problem:   NSVT (nonsustained ventricular tachycardia) (Douglas [name]) Active Problems:   Ischemic heart disease   CAD S/P percutaneous coronary angioplasty   Atrial fibrillation, new onset (Douglas [name])  Allergies Allergies  Allergen Reactions  . Ace Inhibitors Cough  . Lisinopril Cough  . Losartan Cough   Diagnostic Studies/Procedures    Cardiac catheterization 05/11/18:  Prox RCA lesion is 75% stenosed.  A drug-eluting stent was successfully placed using a STENT ORSIRO 3.0X18. -Postdilated to 3.6 mm  Post intervention, there is a 0% residual stenosis.  Mid RCA lesion is 20% stenosed. Dist RCA lesion is 20% stenosed with 20% stenosed side branch in Ost RPDA.  There is mild left ventricular systolic dysfunction. The left ventricular ejection fraction is 45-50% by visual estimate. LV end diastolic pressure is moderately elevated.  Prox LAD to Mid LAD long stented segment is 10% stenosed.   SUMMARY  Two-vessel disease with widely patent stent in the proximal LAD and 75-80% proximal RCA stenosis.  Successful DES PCI of proximal RCA using Osiro DES 3.0 mm x 18 mm postdilated to 3.6 mm.  Moderately elevated LVEDP.  Low normal EF of 45 and 50%.  Difficult to assess regional wall motion normality.  RECOMMENDATIONS  Continue aspirin plus Plavix, if the plan will be to convert to DOAC because of A. fib, would simply stop aspirin after 1 month.  Uncomplicated PCI, okay for discharge home today as same-day discharge stable.  Continue respect modification.  Continue beta-blocker   Follow-up with Douglas [name].  History of Present Illness     73 y.o.male, [name] and former patient of Douglas [name], with  a history of CAD s/p MI in 2007 treated with LAD PCI. He had nuclear stress tests 05/2009 and 10/2012 that were negative for ischemia. Other medical problems include hypertension, hyperlipidemia, chronic occult GI bleed, and OSA on CPAP.The patient presented to the ED 04/18/18 with 2 hours of palpitations and found to be in new onset afib, rate controlled. He self converted in the ER. Since he was already on asa/plavix for CAD, he was not started on anticoagulation. He was referred to the afib clinic 04/23/18 for further evaluation. He was in NSR then. It was decided to place a 30 day monitor to assess his AF burden before committing him to anticoagulation. He was seen in the office on 05/03/18 for follow up.  He was scheduled for an echocardiogram in the setting of AF. It was found to be abnormal with a LVEF of 45-50% with hypokinesis of the apical myocardium and G1DD. Results were sent to his primary cardiologist who recommended cardiac catheterization for further evaluation.   Hospital Course   On 05/11/18 the patient was scheduled for an elective cath which showed two-vessel disease with widely patent stent in the proximal LAD and 75-80% proximal RCA stenosis with successful DES/PCI of proximal RCA using Osiro DES 3.0 mm x 18 mm postdilated to 3.6 mm. There was moderately elevated LVEDP and low normal EF of 45 and 50%. Recommendations are for the continuation of aspirin plus Plavix, if the plan will be to convert to DOAC because of Afib, would simply stop aspirin after 1 month.  Pt tolerated the procedure well without complication. Cath site unremarkable. Post cath instructions  reviewed prior to discharge. Has follow up already in place.   Consultants: None   The patient has been seen and examined by Douglas [name] who feels that he is stable and ready for discharge today after bedrest is complete. Pt has ambulated with cardiac rehabilitation without symptoms.  _____________  Discharge Vitals Blood  pressure (!) 143/98, pulse (!) 51, temperature 97.8 F (36.6 C), temperature source Oral, resp. rate 18, height 5\' 7"  (1.702 m), weight 90.7 kg, SpO2 98 %.  Filed Weights   05/11/18 0654  Weight: 90.7 kg   Labs & Radiologic Studies    Dg Chest 2 View  Result Date: 04/18/2018 CLINICAL DATA:  Dyspnea and tachycardia with nausea x1 day. EXAM: CHEST - 2 VIEW COMPARISON:  11/05/2012 FINDINGS: Top-normal heart size with slight uncoiling of the thoracic aorta. No acute pulmonary consolidation. Left basilar atelectasis is noted. Redemonstration of large hiatal hernia containing and air-fluid level, unchanged in appearance. No effusion or pneumothorax. No acute osseous abnormality. IMPRESSION: Large hiatal hernia. No active pulmonary disease. Electronically Signed   By: Douglas [name] M.D.   On: 04/18/2018 21:33   Disposition   Pt is being discharged home today in good condition.  Follow-up Plans & Appointments    Follow-up Information    Douglas Quan, PA-C Follow up on 05/18/2018.   Specialties:  Cardiology, Radiology Why:  Your follow up appointment will be on 05/18/2018 at 0900am Contact information: [address] [place] [address] [phone]          Discharge Instructions    Amb Referral to Cardiac Rehabilitation   Complete by:  As directed    Diagnosis:   Coronary Stents PTCA     Call MD for:  difficulty breathing, headache or visual disturbances   Complete by:  As directed    Call MD for:  extreme fatigue   Complete by:  As directed    Call MD for:  hives   Complete by:  As directed    Call MD for:  persistant dizziness or light-headedness   Complete by:  As directed    Call MD for:  persistant nausea and vomiting   Complete by:  As directed    Call MD for:  redness, tenderness, or signs of infection (pain, swelling, redness, odor or green/yellow discharge around incision site)   Complete by:  As directed    Call MD for:  severe uncontrolled pain    Complete by:  As directed    Call MD for:  temperature >100.4   Complete by:  As directed    Diet - low sodium heart healthy   Complete by:  As directed    Discharge instructions   Complete by:  As directed    No driving for 3 days. No lifting over 5 lbs for 1 week. No sexual activity for 1 week.  Keep procedure site clean & dry. If you notice increased pain, swelling, bleeding or pus, call/return!  You may shower, but no soaking baths/hot tubs/pools for 1 week.   Increase activity slowly   Complete by:  As directed      Discharge Medications   Allergies as of 05/11/2018      Reactions   Ace Inhibitors Cough   Lisinopril Cough   Losartan Cough      Medication List    TAKE these medications   amLODipine 10 MG tablet Commonly known as:  NORVASC TAKE 1/2 TABLET BY MOUTH EVERY DAY   aspirin  81 MG EC tablet Take 81 mg by mouth daily.   carvedilol 12.5 MG tablet Commonly known as:  COREG TAKE 1 TABLET BY MOUTH TWICE What changed:    how much to take  how to take this  when to take this  additional instructions   clopidogrel 75 MG tablet Commonly known as:  PLAVIX Take 1 tablet (75 mg total) by mouth daily.   colchicine 0.6 MG tablet Take 0.6 mg by mouth 2 (two) times daily as needed (gout). Colcrys   fexofenadine 180 MG tablet Commonly known as:  ALLEGRA Take 180 mg by mouth daily as needed for allergies.   furosemide 40 MG tablet Commonly known as:  LASIX Take 1 tablet (40 mg total) by mouth daily as needed for fluid or edema.   nitroGLYCERIN 0.4 MG SL tablet Commonly known as:  NITROSTAT Place 0.4 mg under the tongue every 5 (five) minutes as needed for chest pain.   ranitidine 150 MG tablet Commonly known as:  ZANTAC Take 150 mg by mouth at bedtime.   simvastatin 20 MG tablet Commonly known as:  ZOCOR TAKE 1 TABLET BY MOUTH EVERYDAY AT BEDTIME What changed:  See the new instructions.   valACYclovir 1000 MG tablet Commonly known as:  VALTREX Take  1,000 mg by mouth 2 (two) times daily as needed (fever blisters).   Vitamin D 50 MCG (2000 UT) tablet Take 2,000 Units by mouth daily.        Acute coronary syndrome (MI, NSTEMI, STEMI, etc) this admission?: No.    Outstanding Labs/Studies   None   Duration of Discharge Encounter   Greater than 30 minutes including physician time.  Signed, Kathyrn Drown, NP 05/11/2018, 5:08 PM

## 2018-05-11 NOTE — Discharge Instructions (Signed)
Drink plenty of fluids over next 48 hours and keep right wrist elevated at heart level for 24 hours  Radial Site Care  This sheet gives you information about how to care for yourself after your procedure. Your health care provider may also give you more specific instructions. If you have problems or questions, contact your health care provider. What can I expect after the procedure? After the procedure, it is common to have:  Bruising and tenderness at the catheter insertion area. Follow these instructions at home: Medicines  Take over-the-counter and prescription medicines only as told by your health care provider. Insertion site care  Follow instructions from your health care provider about how to take care of your insertion site. Make sure you: ? Wash your hands with soap and water before you remove your bandage (dressing). If soap and water are not available, use hand sanitizer. ? Remove dressing in 24-48 hours. Then you may shower.  Check your insertion site every day for signs of infection. Check for: ? Redness, swelling, or pain. ? Fluid or blood. ? Pus or a bad smell. ? Warmth.  Do not take baths, swim, or use a hot tub until your health care provider approves.  You may shower 24-48 hours after the procedure, or as directed by your health care provider. ? Remove the dressing and gently wash the site with plain soap and water. ? Pat the area dry with a clean towel. ? Do not rub the site. That could cause bleeding.  Do not apply powder or lotion to the site. Activity   For 24 hours after the procedure, or as directed by your health care provider: ? Do not flex or bend the affected arm. ? Do not push or pull heavy objects with the affected arm. ? Do not drive yourself home from the hospital or clinic. You may drive 24 hours after the procedure unless your health care provider tells you not to. ? Do not operate machinery or power tools.  Do not lift anything that is  heavier than 5lb, or the limit that you are told, until your health care provider says that it is safe. For 5 days  Ask your health care provider when it is okay to: ? Return to work or school. ? Resume usual physical activities or sports. ? Resume sexual activity. General instructions  If the catheter site starts to bleed, raise your arm and put firm pressure on the site. If the bleeding does not stop, get help right away. This is a medical emergency.  If you went home on the same day as your procedure, a responsible adult should be with you for the first 24 hours after you arrive home.  Keep all follow-up visits as told by your health care provider. This is important. Contact a health care provider if:  You have a fever.  You have redness, swelling, or yellow drainage around your insertion site. Get help right away if:  You have unusual pain at the radial site.  The catheter insertion area swells very fast.  The insertion area is bleeding, and the bleeding does not stop when you hold steady pressure on the area.  Your arm or hand becomes pale, cool, tingly, or numb. These symptoms may represent a serious problem that is an emergency. Do not wait to see if the symptoms will go away. Get medical help right away. Call your local emergency services (911 in the [place] Do not drive yourself to the hospital. Summary  After the procedure, it is common to have bruising and tenderness at the site.  Follow instructions from your health care provider about how to take care of your radial site wound. Check the wound every day for signs of infection.  Do not lift anything that is heavier than 5 lb, or the limit that you are told, until your health care provider says that it is safe. This information is not intended to replace advice given to you by your health care provider. Make sure you discuss any questions you have with your health care provider. Document Released: 05/10/2010 Document  Revised: 05/13/2017 Document Reviewed: 05/13/2017 Elsevier Interactive Patient Education  2019 [employer].

## 2018-05-12 ENCOUNTER — Encounter (HOSPITAL_COMMUNITY): Payer: Self-pay | Admitting: Cardiology

## 2018-05-18 ENCOUNTER — Encounter: Payer: Self-pay | Admitting: Cardiology

## 2018-05-18 ENCOUNTER — Ambulatory Visit (INDEPENDENT_AMBULATORY_CARE_PROVIDER_SITE_OTHER): Payer: Medicare Other | Admitting: Cardiology

## 2018-05-18 VITALS — BP 114/66 | HR 53 | Ht 67.0 in | Wt 200.2 lb

## 2018-05-18 DIAGNOSIS — E785 Hyperlipidemia, unspecified: Secondary | ICD-10-CM

## 2018-05-18 DIAGNOSIS — I480 Paroxysmal atrial fibrillation: Secondary | ICD-10-CM | POA: Diagnosis not present

## 2018-05-18 DIAGNOSIS — G4733 Obstructive sleep apnea (adult) (pediatric): Secondary | ICD-10-CM

## 2018-05-18 DIAGNOSIS — I2510 Atherosclerotic heart disease of native coronary artery without angina pectoris: Secondary | ICD-10-CM | POA: Diagnosis not present

## 2018-05-18 DIAGNOSIS — I255 Ischemic cardiomyopathy: Secondary | ICD-10-CM | POA: Diagnosis not present

## 2018-05-18 DIAGNOSIS — Z9861 Coronary angioplasty status: Secondary | ICD-10-CM

## 2018-05-18 NOTE — Assessment & Plan Note (Addendum)
Anterior MI- LAD PCI 2007 Myoview low risk 2011 and July 2014 Cath -05/11/2018 after new LVD on echo with apical AK and NSVT on monitor showed 75% pRCA treated with  PCI with DES.  Prior LAD stent patent.

## 2018-05-18 NOTE — Assessment & Plan Note (Signed)
Pt seen in the ED Dec 2019 with new onset AF- rate 92. No anticoagulation for now secondary to short duration (monitor results pending)

## 2018-05-18 NOTE — Assessment & Plan Note (Signed)
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

## 2018-05-18 NOTE — Assessment & Plan Note (Signed)
His last LDL was 64 in 2016

## 2018-05-18 NOTE — Assessment & Plan Note (Signed)
On C-pap 

## 2018-05-18 NOTE — Progress Notes (Signed)
05/18/2018 Sharyn Dross [facility]   05-Oct-1945  [id number]  Primary Physician [name], MD Primary Cardiologist: Dr Oval Linsey  HPI:  73 y.o.male with a history of CAD - s/p MI in 2007 treated with LAD PCI.  He had nuclear stress tests 05/2009 and 10/2012 that were negative for ischemia.  Other medical problems include hypertension, hyperlipidemia, chronic occult GI bleed, and OSA on CPAP. The patient presented to the ED 04/18/18 with 2 hours of palpitations and found to be in new onset afib, rate controlled.  He converted spontaneously in the ED.  Since he was already on asa/plavix for CAD, and his episode was brief-2 hours-he was not started on anticoagulation. He was referred to the afib clinic 04/23/18 for further evaluation. He was in NSR then. It was decided to place a 30 day monitor to assess his AF burden before committing him to anticoagulation. His echo showed new LVD- 45-50% with apical AK.  His monitor apparently showed NSVT.  Dr Oval Linsey decided to set the patient up for diagnostic cath 05/11/2018.  This revealed a patent  LAD stent and a 75% pRCA that was intervened on and DES placed. He is in the office today for follow up.  He say he feels less SOB than he did pre PCI.     Current Outpatient Medications  Medication Sig Dispense Refill  . amLODipine (NORVASC) 10 MG tablet TAKE 1/2 TABLET BY MOUTH EVERY DAY (Patient taking differently: Take 5 mg by mouth daily. ) 30 tablet 7  . aspirin 81 MG EC tablet Take 81 mg by mouth daily.      . carvedilol (COREG) 12.5 MG tablet TAKE 1 TABLET BY MOUTH TWICE (Patient taking differently: Take 12.5 mg by mouth 2 (two) times daily with a meal. ) 60 tablet 5  . Cholecalciferol (VITAMIN D) 2000 UNITS tablet Take 2,000 Units by mouth daily.      . clopidogrel (PLAVIX) 75 MG tablet Take 1 tablet (75 mg total) by mouth daily. 30 tablet 6  . colchicine 0.6 MG tablet Take 0.6 mg by mouth 2 (two) times daily as needed (gout). Colcrys    .  fexofenadine (ALLEGRA) 180 MG tablet Take 180 mg by mouth daily as needed for allergies.     . furosemide (LASIX) 40 MG tablet Take 1 tablet (40 mg total) by mouth daily as needed for fluid or edema. 30 tablet 5  . nitroGLYCERIN (NITROSTAT) 0.4 MG SL tablet Place 0.4 mg under the tongue every 5 (five) minutes as needed for chest pain.     . ranitidine (ZANTAC) 150 MG tablet Take 150 mg by mouth at bedtime.     . simvastatin (ZOCOR) 20 MG tablet TAKE 1 TABLET BY MOUTH EVERYDAY AT BEDTIME (Patient taking differently: Take 20 mg by mouth daily at 6 PM. ) 30 tablet 2  . valACYclovir (VALTREX) 1000 MG tablet Take 1,000 mg by mouth 2 (two) times daily as needed (fever blisters).      No current facility-administered medications for this visit.     Allergies  Allergen Reactions  . Ace Inhibitors Cough  . Lisinopril Cough  . Losartan Cough    Past Medical History:  Diagnosis Date  . Anemia   . Arthritis    "joints pains; comes w/age" (05/09/2014)  . Coronary artery disease   . GERD (gastroesophageal reflux disease)   . H/O hiatal hernia 1980  . Heart attack ([place]) 10/29/05   anteroseptal myocardial infaction Taxus stent to LAD  .  History of blood transfusion 1980?   "while hospitalized w/kidney stones, tore my hiatal hernia"  . History of gout   . Hyperlipidemia   . Hypertension   . Ischemic heart disease   . Kidney stones   . Lower extremity edema 08/23/2015  . OSA on CPAP   . Skin cancer    "burnt most of them off; face, arms"    Social History   Socioeconomic History  . Marital status: Married    Spouse name: Not on file  . Number of children: Not on file  . Years of education: Not on file  . Highest education level: Not on file  Occupational History  . Not on file  Social Needs  . Financial resource strain: Not on file  . Food insecurity:    Worry: Not on file    Inability: Not on file  . Transportation needs:    Medical: Not on file    Non-medical: Not on file    Tobacco Use  . Smoking status: Former Smoker    Packs/day: 0.50    Years: 10.00    Pack years: 5.00    Types: Cigarettes    Last attempt to quit: 04/21/1968    Years since quitting: 50.1  . Smokeless tobacco: Former Systems developer    Types: Latexo date: 04/21/1978  Substance and Sexual Activity  . Alcohol use: Yes    Comment: 05/09/2014 "stopped drinking in ~ 2007; never drank much"  . Drug use: No  . Sexual activity: Yes  Lifestyle  . Physical activity:    Days per week: Not on file    Minutes per session: Not on file  . Stress: Not on file  Relationships  . Social connections:    Talks on phone: Not on file    Gets together: Not on file    Attends religious service: Not on file    Active member of club or organization: Not on file    Attends meetings of clubs or organizations: Not on file    Relationship status: Not on file  . Intimate partner violence:    Fear of current or ex partner: Not on file    Emotionally abused: Not on file    Physically abused: Not on file    Forced sexual activity: Not on file  Other Topics Concern  . Not on file  Social History Narrative  . Not on file     Family History  Problem Relation Age of Onset  . Heart disease Mother   . Diabetes Mother   . Heart disease Father   . Diabetes Father      Review of Systems: General: negative for chills, fever, night sweats or weight changes.  Cardiovascular: negative for chest pain, dyspnea on exertion, edema, orthopnea, palpitations, paroxysmal nocturnal dyspnea or shortness of breath Dermatological: negative for rash Respiratory: negative for cough or wheezing Urologic: negative for hematuria Abdominal: negative for nausea, vomiting, diarrhea, bright red blood per rectum, melena, or hematemesis Neurologic: negative for visual changes, syncope, or dizziness All other systems reviewed and are otherwise negative except as noted above.    Blood pressure 114/66, pulse (!) 53, height 5\' 7"  (1.702  m), weight 200 lb 3.2 oz (90.8 kg), SpO2 98 %.  General appearance: alert, cooperative and no distress Lungs: clear to auscultation bilaterally Heart: regular rate and rhythm Extremities: ecchymosis Rt wrist and forearm Skin: Skin color, texture, turgor normal. No rashes or lesions Neurologic: Grossly normal  EKG  NSR, SB-53, septal Qs, lateral TWI (old)  ASSESSMENT AND PLAN:   CAD S/P percutaneous coronary angioplasty Anterior MI- LAD PCI 2007 Myoview low risk 2011 and July 2014 Cath -05/11/2018 after new LVD on echo with apical AK and NSVT on monitor showed 75% pRCA treated with  PCI with DES.  Prior LAD stent patent.    Ischemic cardiomyopathy EF 45-50% with apical AK with moderate ascending aortic dilatation  Obstructive sleep apnea On C-pap  PAF (paroxysmal atrial fibrillation) ([place]) Pt seen in the ED Dec 2019 with new onset AF- rate 92. No anticoagulation for now secondary to short duration (monitor results pending)  Dyslipidemia His last LDL was 64 in 2016   PLAN  Continue current Rx- awaiting final monitor results to determine if he needs anticoagulation. He did have a moderately dilated ascending aorta on echo- will discuss f/u with Dr Oval Linsey.  We should probably repeat a fasting lipid panel as well at next OV.   Kerin Ransom PA-C 05/18/2018 9:28 AM

## 2018-05-18 NOTE — Patient Instructions (Signed)
Medication Instructions:  Your physician recommends that you continue on your current medications as directed. Please refer to the Current Medication list given to you today. If you need a refill on your cardiac medications before your next appointment, please call your pharmacy.   Lab work: None  If you have labs (blood work) drawn today and your tests are completely normal, you will receive your results only by: Marland Kitchen MyChart Message (if you have MyChart) OR . A paper copy in the mail If you have any lab test that is abnormal or we need to change your treatment, we will call you to review the results.  Testing/Procedures: None   Follow-Up: At [facility], you and your health needs are our priority.  As part of our continuing mission to provide you with exceptional heart care, we have created designated Provider Care Teams.  These Care Teams include your primary Cardiologist (physician) and Advanced Practice Providers (APPs -  Physician Assistants and Nurse Practitioners) who all work together to provide you with the care you need, when you need it. Marland Kitchen LUKE RECOMMENDS YOU FOLLOW UP WITH DR [place] AS SCHEDULED.  Any Other Special Instructions Will Be Listed Below (If Applicable).

## 2018-05-24 ENCOUNTER — Other Ambulatory Visit: Payer: Self-pay | Admitting: Cardiovascular Disease

## 2018-05-24 DIAGNOSIS — I119 Hypertensive heart disease without heart failure: Secondary | ICD-10-CM

## 2018-05-24 NOTE — Telephone Encounter (Signed)
Rx request sent to pharmacy.  

## 2018-06-09 ENCOUNTER — Telehealth (HOSPITAL_COMMUNITY): Payer: Self-pay

## 2018-06-09 NOTE — Telephone Encounter (Signed)
Pt insurance is active and benefits verified through [facility]. Co-pay $20.00, DED $0.00/$0.00 met, out of pocket $3,600.00/$477.17 met, co-insurance 0%. No pre-authorization required. Passport, 06/09/2018 @ 1:18PM, REF# [id number]

## 2018-06-11 NOTE — Telephone Encounter (Signed)
Called pt to see if he was interested in Cardiac rehab and pt stated that he feels great and that he will think about it and call back. Tedra Senegal. Support Rep II

## 2018-06-14 ENCOUNTER — Ambulatory Visit (INDEPENDENT_AMBULATORY_CARE_PROVIDER_SITE_OTHER): Payer: Medicare Other | Admitting: Cardiovascular Disease

## 2018-06-14 ENCOUNTER — Encounter: Payer: Self-pay | Admitting: Cardiovascular Disease

## 2018-06-14 VITALS — BP 124/89 | HR 57 | Ht 67.0 in | Wt 201.0 lb

## 2018-06-14 DIAGNOSIS — Z9861 Coronary angioplasty status: Secondary | ICD-10-CM

## 2018-06-14 DIAGNOSIS — R001 Bradycardia, unspecified: Secondary | ICD-10-CM | POA: Diagnosis not present

## 2018-06-14 DIAGNOSIS — I2510 Atherosclerotic heart disease of native coronary artery without angina pectoris: Secondary | ICD-10-CM | POA: Diagnosis not present

## 2018-06-14 DIAGNOSIS — I10 Essential (primary) hypertension: Secondary | ICD-10-CM

## 2018-06-14 DIAGNOSIS — E7800 Pure hypercholesterolemia, unspecified: Secondary | ICD-10-CM | POA: Diagnosis not present

## 2018-06-14 NOTE — Progress Notes (Signed)
Cardiology Office Note   Date:  06/14/2018   ID:  Douglas [name], Douglas [name] November 15, 1945, MRN [id number]  PCP:  [name], MD  Cardiologist:   [name], MD   No chief complaint on file.    History of Present Illness: Douglas [name] is a 73 y.o. male with CAD s/p MI, hypertension, hyperlipidemia, chronic occult GI bleed, and OSA on CPAP who presents for follow up.  Douglas [name] was previously a patient of [name].  He had an anterior MI 10/2005.  He had nuclear stress tests 05/2009 and 10/2012 that were negative for ischemia.  At previous appointments Carvedilol was reduced due to bradycardia. He has been doing well recently. He denies any chest pain, lightheadedness, or dizziness. He has mild shortness of breath with exertion that is unchanged from baseline. He denies any lower extremity edema, orthopnea, or PND. He has not needed to take any Lasix since his last appointment.  Douglas [name] has not been exercising regularly.  He does work in his garden and he works with 6 horses.  He denies exertional symptoms with these activities.    At his last appointment Douglas [name] reported dizziness and his heart rate was noted to be low.  He was referred for an event monitor that showed 5 beats of NSVT.  He was referred for an echo 05/04/2018 that revealed LVEF 45 to 50% with hypokinesis of the apical myocardium.  He also had grade 1 diastolic dysfunction.  He then underwent left heart catheterization that revealed a 75% proximal RCA lesion.  A drug-eluting stent was placed.  His prior LAD stent was patent.  Since leaving the hospital he has felt well.  He has no chest pain.  He only has shortness of breath when he bends over.  He notes that he has a known, large hiatal hernia.  He denies GERD.   He has not experienced any lower extremity edema, orthopnea, or PND.  He wants to see if his breathing will improve with weight loss.   Past Medical History:  Diagnosis Date  . Anemia     . Arthritis    "joints pains; comes w/age" (05/09/2014)  . Coronary artery disease   . GERD (gastroesophageal reflux disease)   . H/O hiatal hernia 1980  . Heart attack ([place]) 10/29/05   anteroseptal myocardial infaction Taxus stent to LAD  . History of blood transfusion 1980?   "while hospitalized w/kidney stones, tore my hiatal hernia"  . History of gout   . Hyperlipidemia   . Hypertension   . Ischemic heart disease   . Kidney stones   . Lower extremity edema 08/23/2015  . OSA on CPAP   . Skin cancer    "burnt most of them off; face, arms"    Past Surgical History:  Procedure Laterality Date  . CARDIOVASCULAR STRESS TEST Left 05/28/2009   EF 57% no reversible ischemia  . CATARACT EXTRACTION W/ INTRAOCULAR LENS IMPLANT Left 1997  . CORONARY ANGIOPLASTY WITH STENT PLACEMENT Bilateral 2007   "1"  . CORONARY STENT INTERVENTION N/A 05/11/2018   Procedure: CORONARY STENT INTERVENTION;  Surgeon: Leonie Man, MD;  Location: [place] [facility];  Service: Cardiovascular;  Laterality: N/A;  . DOPPLER ECHOCARDIOGRAPHY  01/06/2007  . EYE SURGERY Left 1978   "injury"  . HERNIA REPAIR    . INGUINAL HERNIA REPAIR Bilateral 05/09/2014  . INGUINAL HERNIA REPAIR Bilateral 05/09/2014   Procedure: LAPAROSCOPIC BILATERAL INGUINAL HERNIA REPAIR;  Surgeon: Remo Lipps  Gross, MD;  Location: [place];  Service: General;  Laterality: Bilateral;  . INSERTION OF MESH N/A 05/09/2014   Procedure: INSERTION OF MESH;  Surgeon: [name], MD;  Location: [place];  Service: General;  Laterality: N/A;  INSERTION OF MESH TO ABDOMEN AND BILATERAL GROIN  . LEFT HEART CATH AND CORONARY ANGIOGRAPHY N/A 05/11/2018   Procedure: LEFT HEART CATH AND CORONARY ANGIOGRAPHY;  Surgeon: Leonie Man, MD;  Location: [place] [facility];  Service: Cardiovascular;  Laterality: N/A;  . SHOULDER ARTHROSCOPY W/ ROTATOR CUFF REPAIR Right 06/2010  . SKIN CANCER EXCISION Left    "arm"  . TONSILLECTOMY  1964  . UMBILICAL HERNIA REPAIR   05/09/2014  . UMBILICAL HERNIA REPAIR N/A 05/09/2014   Procedure: HERNIA REPAIR UMBILICAL ADULT;  Surgeon: [name], MD;  Location: [place];  Service: General;  Laterality: N/A;     Current Outpatient Medications  Medication Sig Dispense Refill  . amLODipine (NORVASC) 10 MG tablet Take 0.5 tablets (5 mg total) by mouth daily. 90 tablet 1  . aspirin 81 MG EC tablet Take 81 mg by mouth daily.      . carvedilol (COREG) 12.5 MG tablet TAKE 1 TABLET BY MOUTH TWICE (Patient taking differently: Take 12.5 mg by mouth 2 (two) times daily with a meal. ) 60 tablet 5  . Cholecalciferol (VITAMIN D) 2000 UNITS tablet Take 2,000 Units by mouth daily.      . clopidogrel (PLAVIX) 75 MG tablet TAKE 1 TABLET BY MOUTH DAILY 30 tablet 6  . colchicine 0.6 MG tablet Take 0.6 mg by mouth 2 (two) times daily as needed (gout). Colcrys    . fexofenadine (ALLEGRA) 180 MG tablet Take 180 mg by mouth daily as needed for allergies.     . furosemide (LASIX) 40 MG tablet Take 1 tablet (40 mg total) by mouth daily as needed for fluid or edema. 30 tablet 5  . nitroGLYCERIN (NITROSTAT) 0.4 MG SL tablet Place 0.4 mg under the tongue every 5 (five) minutes as needed for chest pain.     . ranitidine (ZANTAC) 150 MG tablet Take 150 mg by mouth at bedtime.     . simvastatin (ZOCOR) 20 MG tablet TAKE 1 TABLET BY MOUTH EVERYDAY AT BEDTIME (Patient taking differently: Take 20 mg by mouth daily at 6 PM. ) 30 tablet 2  . valACYclovir (VALTREX) 1000 MG tablet Take 1,000 mg by mouth 2 (two) times daily as needed (fever blisters).      No current facility-administered medications for this visit.     Allergies:   Ace inhibitors; Lisinopril; and Losartan    Social History:  The patient  reports that he quit smoking about 50 years ago. His smoking use included cigarettes. He has a 5.00 pack-year smoking history. He quit smokeless tobacco use about 40 years ago.  His smokeless tobacco use included chew. He reports current alcohol use. He  reports that he does not use drugs.   Family History:  The patient's family history includes Diabetes in his father and mother; Heart disease in his father and mother.    ROS:  Please see the history of present illness.   Otherwise, review of systems are positive for none.   All other systems are reviewed and negative.    PHYSICAL EXAM: VS:  BP 124/89   Pulse (!) 57   Ht 5\' 7"  (1.702 m)   Wt 201 lb (91.2 kg)   SpO2 95%   BMI 31.48 kg/m  , BMI  Body mass index is 31.48 kg/m. GENERAL:  Well appearing HEENT: Pupils equal round and reactive, fundi not visualized, oral mucosa unremarkable NECK:  No jugular venous distention, waveform within normal limits, carotid upstroke brisk and symmetric, no bruits, no thyromegaly LYMPHATICS:  No cervical adenopathy LUNGS:  Clear to auscultation bilaterally HEART:  RRR.  PMI not displaced or sustained,S1 and S2 within normal limits, no S3, no S4, no clicks, no rubs, no murmurs ABD:  Flat, positive bowel sounds normal in frequency in pitch, no bruits, no rebound, no guarding, no midline pulsatile mass, no hepatomegaly, no splenomegaly EXT:  2 plus pulses throughout, no edema, no cyanosis no clubbing SKIN:  No rashes no nodules NEURO:  Cranial nerves II through XII grossly intact, motor grossly intact throughout PSYCH:  Cognitively intact, oriented to person place and time   EKG:  EKG is not ordered today. 12/27/15: Sinus bradycardia.  Rate 49 bpm.  Prior septal infarct.  Prior lateral infarct 09/29/16: Sinus bradycardia. Rate 54 bpm. Left anterior fascicular block. Prior septal infarct. 04/24/17: Sinus bradycardia.  Rate 53 bpm.  Prior anterior infarct.  Prior lateral infarct.    30 Day Event Monitor 04/28/18:  Quality: Fair.  Baseline artifact. Predominant rhythm: sinus rhythm Average heart rate: 60 bpm Max heart rate: 99 bpm Min heart rate: 47bpm  Occasional PACs and PVCs Up to 5 beats of NSVT   Echo 05/04/18: Study Conclusions  - Left  ventricle: The cavity size was normal. Wall thickness was   increased in a pattern of mild LVH. There was mild focal basal   hypertrophy of the septum. Systolic function was mildly reduced.   The estimated ejection fraction was in the range of 45% to 50%.   There is hypokinesis of the apical myocardium. Doppler parameters   are consistent with abnormal left ventricular relaxation (grade 1   diastolic dysfunction). - Aortic valve: There was trivial regurgitation. 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 - Left atrium: The atrium was mildly dilated. - Pulmonary arteries: Systolic pressure was mildly increased. PA   peak pressure: 33 mm Hg (S).  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   LHC 04/2018:  Prox RCA lesion is 75% stenosed.  A drug-eluting stent was successfully placed using a STENT ORSIRO 3.0X18. -Postdilated to 3.6 mm  Post intervention, there is a 0% residual stenosis.  Mid RCA lesion is 20% stenosed. Dist RCA lesion is 20% stenosed with 20% stenosed side branch in Ost RPDA.  There is mild left ventricular systolic dysfunction. The left ventricular ejection fraction is 45-50% by visual estimate. LV end diastolic pressure is moderately elevated.  Prox LAD to Mid LAD long stented segment is 10% stenosed.   SUMMARY  Two-vessel disease with widely patent stent in the proximal LAD and 75-80% proximal RCA stenosis.  Successful DES PCI of proximal RCA using Osiro DES 3.0 mm x 18 mm postdilated to 3.6 mm.  Moderately elevated LVEDP.  Low normal EF of 45 and 50%.  Difficult to assess regional wall motion normality.  RECOMMENDATIONS  Continue aspirin plus Plavix, if the plan will be to convert to DOAC because of A. fib, would simply stop  aspirin after 1 month.  Uncomplicated PCI, okay for discharge home today as same-day discharge stable.  Continue respect  modification.  Continue beta-blocker  Recent Labs: 04/18/2018: B Natriuretic Peptide 319.3; BUN 19; Creatinine, Ser 1.12; Potassium 4.0; Sodium 139 05/03/2018: Hemoglobin 15.1; Magnesium 2.2; Platelets 170; TSH 2.530   08/17/15: WBC 5.2, hematocrit 47.8, hemoglobin 16.4, platelets 172 TSH 2.33 Sodium 141, potassium 4.8, BUN 16, creatinine 0.99 Albumin 3.7 AST 16, ALT 21 Triglycerides 117, HDL 37, LDL 59, total cholesterol 119  09/29/16: Total cholesterol 112, triglycerides 106, HDL 43, LDL 48 WBC 5.8, hemoglobin 16.7, hematocrit 48.4, platelets 181 Sodium 137, potassium 4.5, BUN 17, creatinine 0.91 AST 11, ALT 20  08/11/2017: WBC 5.8, hemoglobin 16.5, hematocrit 48.1, platelets 167 Sodium 139, potassium 4.1, BUN 13, creatinine 0.919 AST 15, ALT 22 Total cholesterol 112, triglycerides 93, HDL 36, LDL 57 TSH 2.37  Lipid Panel    Component Value Date/Time   CHOL 111 (L) 04/19/2015 0801   TRIG 84 04/19/2015 0801   HDL 30 (L) 04/19/2015 0801   CHOLHDL 3.7 04/19/2015 0801   VLDL 17 04/19/2015 0801   LDLCALC 64 04/19/2015 0801      Wt Readings from Last 3 Encounters:  06/14/18 201 lb (91.2 kg)  05/18/18 200 lb 3.2 oz (90.8 kg)  05/11/18 200 lb (90.7 kg)      ASSESSMENT AND PLAN:  # CAD: # Hyperlipidemia: S/p RCA PCI 04/2018.  Prior MI with LAD stent.  Continue aspirin and clopidogrel x12 months then ASA only. Continue carvedilol and simvastatin.  He will get lipids next month.  LDL goal <70.  # Bradycardia: Heart rate stable.  He has no lightheadedness or dizziness.  Continue carvedilol 12.5mg  bid.  If he becomes symptomatic we will reduce carvedilol and increase amlodipine.    # Dizziness: Reduced since lowering amlodipine.    # LE edema: Improved.  He has not needed to use Lasix recently.  I suspect this is due to chronic venous insufficiency.     # Hypertension: Blood pressure is above goal here but has been controlled at home.  Continue carvedilol and amlodipine.      Current medicines are reviewed at length with the patient today.  The patient does not have concerns regarding medicines.  The following changes have been made:  no change  Labs/ tests ordered today include:  No orders of the defined types were placed in this encounter.    Disposition:   FU with Anielle Headrick C. Oval Linsey, MD, [facility] in 7 months.     Signed, Penney Domanski C. Oval Linsey, MD, [facility]  06/14/2018 9:41 AM    [place]

## 2018-06-14 NOTE — Patient Instructions (Signed)
Medication Instructions:  Your physician recommends that you continue on your current medications as directed. Please refer to the Current Medication list given to you today.  If you need a refill on your cardiac medications before your next appointment, please call your pharmacy.   Lab work: NONE  Testing/Procedures: NONE  Follow-Up: At [facility], you and your health needs are our priority.  As part of our continuing mission to provide you with exceptional heart care, we have created designated Provider Care Teams.  These Care Teams include your primary Cardiologist (physician) and Advanced Practice Providers (APPs -  Physician Assistants and Nurse Practitioners) who all work together to provide you with the care you need, when you need it. You will need a follow up appointment in 7 months.  Please call our office 2 months in advance to schedule this appointment.  You may see [name], MD or one of the following Advanced Practice Providers on your designated Care Team:   Kerin Ransom, PA-C Roby Lofts, [place] . Sande Rives, PA-C

## 2018-06-28 NOTE — Telephone Encounter (Signed)
Called patient to see if he was interested in participating in the Cardiac Rehab Program. Patient stated not at this time.  Closed referral 

## 2018-07-01 ENCOUNTER — Emergency Department (HOSPITAL_COMMUNITY)
Admission: EM | Admit: 2018-07-01 | Discharge: 2018-07-02 | Disposition: A | Discharge status: Home / Self Care | Payer: Medicare Other | Attending: Emergency Medicine | Admitting: Emergency Medicine

## 2018-07-01 ENCOUNTER — Other Ambulatory Visit: Payer: Self-pay

## 2018-07-01 ENCOUNTER — Encounter (HOSPITAL_COMMUNITY): Payer: Self-pay | Admitting: Emergency Medicine

## 2018-07-01 DIAGNOSIS — I2510 Atherosclerotic heart disease of native coronary artery without angina pectoris: Secondary | ICD-10-CM | POA: Diagnosis not present

## 2018-07-01 DIAGNOSIS — Z87891 Personal history of nicotine dependence: Secondary | ICD-10-CM | POA: Diagnosis not present

## 2018-07-01 DIAGNOSIS — Z79899 Other long term (current) drug therapy: Secondary | ICD-10-CM | POA: Diagnosis not present

## 2018-07-01 DIAGNOSIS — Z7982 Long term (current) use of aspirin: Secondary | ICD-10-CM | POA: Insufficient documentation

## 2018-07-01 DIAGNOSIS — I10 Essential (primary) hypertension: Secondary | ICD-10-CM | POA: Diagnosis not present

## 2018-07-01 DIAGNOSIS — R531 Weakness: Secondary | ICD-10-CM | POA: Diagnosis present

## 2018-07-01 DIAGNOSIS — I252 Old myocardial infarction: Secondary | ICD-10-CM | POA: Insufficient documentation

## 2018-07-01 DIAGNOSIS — R110 Nausea: Secondary | ICD-10-CM

## 2018-07-01 LAB — I-STAT TROPONIN, ED: Troponin i, poc: 0.01 ng/mL (ref 0.00–0.08)

## 2018-07-01 LAB — CBC
HCT: 48.4 % (ref 39.0–52.0)
Hemoglobin: 16.2 g/dL (ref 13.0–17.0)
MCH: 30.6 pg (ref 26.0–34.0)
MCHC: 33.5 g/dL (ref 30.0–36.0)
MCV: 91.3 fL (ref 80.0–100.0)
Platelets: 168 10*3/uL (ref 150–400)
RBC: 5.3 MIL/uL (ref 4.22–5.81)
RDW: 13.2 % (ref 11.5–15.5)
WBC: 11.3 10*3/uL — ABNORMAL HIGH (ref 4.0–10.5)
nRBC: 0 % (ref 0.0–0.2)

## 2018-07-01 LAB — BASIC METABOLIC PANEL
Anion gap: 8 (ref 5–15)
BUN: 25 mg/dL — ABNORMAL HIGH (ref 8–23)
CO2: 20 mmol/L — ABNORMAL LOW (ref 22–32)
Calcium: 8.8 mg/dL — ABNORMAL LOW (ref 8.9–10.3)
Chloride: 112 mmol/L — ABNORMAL HIGH (ref 98–111)
Creatinine, Ser: 1 mg/dL (ref 0.61–1.24)
GFR calc Af Amer: >60 mL/min (ref >60)
GFR calc non Af Amer: >60 mL/min (ref >60)
Glucose, Bld: 130 mg/dL — ABNORMAL HIGH (ref 70–99)
Potassium: 4.2 mmol/L (ref 3.5–5.1)
Sodium: 140 mmol/L (ref 135–145)

## 2018-07-01 NOTE — ED Triage Notes (Addendum)
Pt in POV with weakness feeling x 1 hr. States he has Afib, similar has happened once in past with same symptoms, hx of stent placement. EKG sinus rhythm in triage. Has mild cough, no fevers, denies sob, just feels "puny", denies any cp
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
## 2018-08-20 ENCOUNTER — Ambulatory Visit: Payer: Medicare Other | Admitting: Internal Medicine

## 2018-09-09 ENCOUNTER — Other Ambulatory Visit: Payer: Self-pay

## 2018-09-09 ENCOUNTER — Ambulatory Visit: Payer: Medicare Other | Admitting: Internal Medicine

## 2018-09-09 ENCOUNTER — Encounter: Payer: Self-pay | Admitting: Internal Medicine

## 2018-09-09 VITALS — BP 132/70 | HR 59 | Temp 98.2°F | Ht 67.0 in | Wt 199.0 lb

## 2018-09-09 DIAGNOSIS — I2510 Atherosclerotic heart disease of native coronary artery without angina pectoris: Secondary | ICD-10-CM | POA: Diagnosis not present

## 2018-09-09 DIAGNOSIS — G4733 Obstructive sleep apnea (adult) (pediatric): Secondary | ICD-10-CM

## 2018-09-09 DIAGNOSIS — Z9861 Coronary angioplasty status: Secondary | ICD-10-CM | POA: Diagnosis not present

## 2018-09-09 NOTE — Patient Instructions (Signed)
Order- DME [facility]- please replace old CPAP machine, change to auto 5-15, ask of choice, humidifier, supplies, Airview/ card  Please call if we can help

## 2018-09-09 NOTE — Progress Notes (Signed)
HPI male former smoker followed for OSA, complicated by CAD/stent, HBP, GERD NPSG 01/18/13 [facility]  22/hr, moderate OSA, weight  195 lbs  ---------------------------------------------------------------------------.  08/19/2017-73 year old male former smoker followed for OSA, complicated by CAD/stent, HBP, GERD CPAP 12/ [facility] ----OSA: DME [facility] Pt wears CPAP nightly for at least 7 hours and pressure works well. No supplies needed Download 100% compliance AHI 1.5/hour.  His wife says that he snores loudly if he does not wear his CPAP.  He sleeps better with it and is quite satisfied with no changes needed. He denies cardiac or other significant issues since last year.  09/09/2018- 74 year old male former smoker followed for OSA, complicated by CAD/stent, PAFib, HBP, GERD/ HH CPAP 12/ [facility] -----OSA on CPAP; DME: [facility], pt states he's using CPAP every night, no issues w/ pressure settings or mask; no new supplies needed at this time Body weight today 199 lbs Couldn't download card today. Machine is 74 years old. Had cardiac cath/ stent- no MI  ROS-see HPI   + = positive Constitutional:   No-   weight loss, night sweats, fevers, chills, fatigue, lassitude. HEENT:   No-  headaches, difficulty swallowing, tooth/dental problems, sore throat,       No-  sneezing, itching, ear ache, nasal congestion, post nasal drip,  CV:  No-   chest pain, orthopnea, PND, swelling in lower extremities, anasarca, dizziness, palpitations Resp: No-   shortness of breath with exertion or at rest.              No-   productive cough,  No non-productive cough,  No- coughing up of blood.              No-   change in color of mucus.  No- wheezing.   Skin: No-   rash or lesions. GI:  No-   heartburn, indigestion, abdominal pain, nausea, vomiting,  GU:  MS:  No-   joint pain or swelling.   Neuro-     nothing unusual Psych:  No- change in mood or affect. No depression or anxiety.  No memory loss.  OBJ- Physical Exam   stable  baseline exam General- Alert, Oriented, Affect-appropriate, Distress- none acute. +Overweight Skin- rash-none, lesions- none, excoriation- none Lymphadenopathy- none Head- atraumatic            Eyes- Gross vision intact, PERRLA, conjunctivae and secretions clear            Ears- Hearing, canals-normal            Nose- Clear, no-Septal dev, mucus, polyps, erosion, perforation             Throat- Mallampati II-III , mucosa clear , drainage- none, tonsils- atrophic Neck- flexible , trachea midline, no stridor , thyroid nl, carotid no bruit Chest - symmetrical excursion , unlabored           Heart/CV- RRR at this exam , no murmur , no gallop  , no rub, nl s1 s2                           - JVD- none , edema- none, stasis changes- none, varices- none           Lung- clear to P&A, wheeze- none, cough- none , dullness-none, rub- none           Chest wall-  Abd-  Br/ Gen/ Rectal- Not done, not indicated Extrem- cyanosis- none, clubbing, none, atrophy- none, strength- nl Neuro-  grossly intact to observation

## 2018-09-20 ENCOUNTER — Telehealth: Payer: Self-pay

## 2018-09-20 NOTE — Telephone Encounter (Signed)
   [facility] Pre-operative Risk Assessment    Request for surgical clearance:  1. What type of surgery is being performed? Colonscopy/ Endoscopy  2. When is this surgery scheduled? 10/07/18   3. What type of clearance is required (medical clearance vs. Pharmacy clearance to hold med vs. Both)? pharmacy  4. Are there any medications that need to be held prior to surgery and how long?Plavix, NOT LISTED   5. Practice name and name of physician performing surgery? [facility], Dr. Oletta Lamas    6. What is your office phone number [phone]   7.   What is your office fax number [phone]   8.   Anesthesia type (None, local, MAC, general) ? NOT LISTED   Douglas [name] 09/20/2018, 4:29 PM  _________________________________________________________________   (provider comments below)

## 2018-09-21 NOTE — Telephone Encounter (Signed)
   Primary Cardiologist: [name], MD  Chart reviewed as part of pre-operative protocol coverage. Douglas [name] was last seen on 06/14/18 by Dr. Oval Linsey. He has history of CAD s/p prior MI, fairly recent DES placement to RCA 04/2018, bradycardia, NSVT, GERD, hiatal hernia, HTN, HLD, kidney stones, OSA, PAF (not on AC due to short duration).  Per cath report, "Recommend uninterrupted dual antiplatelet therapy for a minimum of 6 months (stable ischemic heart disease-Class I recommendation). After 6 months, would be okay to stop aspirin, but preferably continue Plavix for least another year."  We are not yet at the 6 month mark, but clearance requests to hold Plavix. Therefore, need input from primary cardiologist - she is out on leave at this time with Dr. Margaretann Loveless as her delegate. Will route to Dr. Margaretann Loveless to review.   Dr Margaretann Loveless - Please route response to P [facility] PREOP (the pre-op pool). Thank you.     Charlie Pitter, PA-C 09/21/2018, 4:52 PM

## 2018-09-21 NOTE — Assessment & Plan Note (Signed)
Benefits from CPAP with improved sleep.Needs replacement for old machine which will allow downloads again. Plan- replace old CPAP, change to auto 5-15

## 2018-09-21 NOTE — Assessment & Plan Note (Signed)
Managed by cardiology. Exam today c/w sinus rhythm.

## 2018-09-22 ENCOUNTER — Telehealth: Payer: Self-pay | Admitting: Cardiology

## 2018-09-22 NOTE — Telephone Encounter (Signed)
Not in Error.  Glenetta Hew, MD

## 2018-09-22 NOTE — Telephone Encounter (Signed)
I am clarifying with Dr. Ellyn Hack re: his recommendations and will address when we have come to a consensus.  Thanks, GA

## 2018-09-23 NOTE — Telephone Encounter (Signed)
I have spoken to Dr. Ellyn Hack regarding his recommendations on cath report.   For procedure, ok to hold plavix 5-7 days prior to colonoscopy on 10/07/2018, and restart after colonoscopy when OK from GI.

## 2018-09-23 NOTE — Telephone Encounter (Signed)
   Primary Cardiologist: [name], MD  Chart reviewed as part of pre-operative protocol coverage. Given past medical history and time since last visit, based on ACC/AHA guidelines, Brodyn Depuy would be at acceptable risk for the planned procedure without further cardiovascular testing.   OK to hold Plavix 5-7 day pre op if needed, resume ASAP post op.   I will route this recommendation to the requesting party via Epic fax function and remove from pre-op pool.  Please call with questions.  Kerin Ransom, PA-C 09/23/2018, 11:37 AM

## 2018-10-19 ENCOUNTER — Other Ambulatory Visit: Payer: Self-pay | Admitting: Gastroenterology

## 2018-10-19 DIAGNOSIS — K449 Diaphragmatic hernia without obstruction or gangrene: Secondary | ICD-10-CM

## 2018-11-01 ENCOUNTER — Ambulatory Visit
Admission: RE | Admit: 2018-11-01 | Discharge: 2018-11-01 | Disposition: A | Discharge status: Home / Self Care | Payer: Medicare Other | Source: Ambulatory Visit | Attending: Gastroenterology | Admitting: Gastroenterology

## 2018-11-01 DIAGNOSIS — K449 Diaphragmatic hernia without obstruction or gangrene: Secondary | ICD-10-CM

## 2018-11-05 ENCOUNTER — Other Ambulatory Visit: Payer: Self-pay | Admitting: Gastroenterology

## 2018-11-05 DIAGNOSIS — K449 Diaphragmatic hernia without obstruction or gangrene: Secondary | ICD-10-CM

## 2018-11-16 ENCOUNTER — Ambulatory Visit
Admission: RE | Admit: 2018-11-16 | Discharge: 2018-11-16 | Disposition: A | Discharge status: Home / Self Care | Payer: Medicare Other | Source: Ambulatory Visit | Attending: Gastroenterology | Admitting: Gastroenterology

## 2018-11-16 DIAGNOSIS — K449 Diaphragmatic hernia without obstruction or gangrene: Secondary | ICD-10-CM

## 2018-11-16 MED ORDER — IOPAMIDOL (ISOVUE-300) INJECTION 61%
75.0000 mL | Freq: Once | INTRAVENOUS | Status: AC | PRN
  Administered 2018-11-16: 75 mL via INTRAVENOUS

## 2018-12-03 ENCOUNTER — Ambulatory Visit: Payer: Medicare Other | Admitting: Internal Medicine

## 2018-12-17 ENCOUNTER — Other Ambulatory Visit: Payer: Self-pay | Admitting: Gastroenterology

## 2018-12-28 ENCOUNTER — Other Ambulatory Visit: Payer: Self-pay | Admitting: Cardiovascular Disease

## 2019-01-01 ENCOUNTER — Other Ambulatory Visit (HOSPITAL_COMMUNITY)
Admission: RE | Admit: 2019-01-01 | Discharge: 2019-01-01 | Disposition: A | Discharge status: Home / Self Care | Payer: Medicare Other | Source: Ambulatory Visit | Attending: Gastroenterology | Admitting: Gastroenterology

## 2019-01-01 DIAGNOSIS — Z01812 Encounter for preprocedural laboratory examination: Secondary | ICD-10-CM | POA: Diagnosis present

## 2019-01-01 DIAGNOSIS — Z20828 Contact with and (suspected) exposure to other viral communicable diseases: Secondary | ICD-10-CM | POA: Diagnosis not present

## 2019-01-02 LAB — NOVEL CORONAVIRUS, NAA (HOSP ORDER, SEND-OUT TO REF LAB; TAT 18-24 HRS): SARS-CoV-2, NAA: NOT DETECTED

## 2019-01-04 NOTE — Progress Notes (Signed)
Spoke with patient, he has been able to quaratine without symptoms since covid 19 testing.  Patient arrival time is 1015 in am.  Verbalizes understanding to be NPO after MN.  Answered patient questions regarding procedure.

## 2019-01-05 ENCOUNTER — Encounter (HOSPITAL_COMMUNITY)
Admission: RE | Disposition: A | Discharge status: Home / Self Care | Payer: Self-pay | Source: Home / Self Care | Attending: Gastroenterology

## 2019-01-05 ENCOUNTER — Ambulatory Visit (HOSPITAL_COMMUNITY)
Admission: RE | Admit: 2019-01-05 | Discharge: 2019-01-05 | Disposition: A | Discharge status: Home / Self Care | Payer: Medicare Other | Attending: Gastroenterology | Admitting: Gastroenterology

## 2019-01-05 DIAGNOSIS — K449 Diaphragmatic hernia without obstruction or gangrene: Secondary | ICD-10-CM | POA: Diagnosis not present

## 2019-01-05 DIAGNOSIS — Z01818 Encounter for other preprocedural examination: Secondary | ICD-10-CM | POA: Insufficient documentation

## 2019-01-05 HISTORY — PX: ESOPHAGEAL MANOMETRY: SHX5429

## 2019-01-05 SURGERY — MANOMETRY, ESOPHAGUS

## 2019-01-05 MED ORDER — LIDOCAINE VISCOUS HCL 2 % MT SOLN
OROMUCOSAL | Status: AC
  Filled 2019-01-05: qty 15

## 2019-01-05 SURGICAL SUPPLY — 2 items
FACESHIELD LNG OPTICON STERILE (SAFETY) IMPLANT
GLOVE BIO SURGEON STRL SZ8 (GLOVE) ×4 IMPLANT

## 2019-01-05 NOTE — Progress Notes (Signed)
Esophageal manometry done per protocol.  Patient tolerated well. Dr Michail Sermon will be made aware of mano to read.

## 2019-01-07 ENCOUNTER — Encounter (HOSPITAL_COMMUNITY): Payer: Self-pay | Admitting: Gastroenterology

## 2019-01-16 NOTE — Progress Notes (Signed)
Cardiology Office Note   Date:  01/17/2019   ID:  Wa, Rumpf 1945/08/19, MRN [id number]  PCP:  [name], MD  Cardiologist:   [name], MD   No chief complaint on file.    History of Present Illness: Douglas [name] is a 73 y.o. male with CAD s/p MI and PCI (LAD 2007, RCA 2020), hypertension, hyperlipidemia, chronic occult GI bleed, and OSA on CPAP who presents for follow up.  Douglas [name] was previously a patient of [name].  He had an anterior MI 10/2005.  He had nuclear stress tests 05/2009 and 10/2012 that were negative for ischemia.  At previous appointments carvedilol was reduced due to bradycardia. He has been doing well recently. He denies any chest pain, lightheadedness, or dizziness. He has mild shortness of breath with exertion that is unchanged from baseline. He denies any lower extremity edema, orthopnea, or PND. He has not needed to take any Lasix since his last appointment.  Douglas [name] has not been exercising regularly.  He does work in his garden and he works with 6 horses.  He denies exertional symptoms with these activities.    Douglas [name] reported dizziness and his heart rate was noted to be low.  He was referred for an event monitor that showed 5 beats of NSVT.  He was referred for an echo 05/04/2018 that revealed LVEF 45 to 50% with hypokinesis of the apical myocardium.  He also had grade 1 diastolic dysfunction.  He then underwent left heart catheterization that revealed a 75% proximal RCA lesion.  A drug-eluting stent was placed.  His prior LAD stent was patent.  Since his last appointment he has been doing well.  He has been struggling with a hiatal hernia.  He notices that he has been getting more short of breath with exertion lately.  It is worse when bending over.  He does not notice it as much when walking but does have it some.  He denies any chest pain, nausea, or diaphoresis.  He has not experienced any lower extremity edema,  orthopnea, or PND.  He is getting some exercise by walking but not much.  He is thinking about having a Nissen fundoplication surgery.  He denies dizziness or lightheadedness.   Past Medical History:  Diagnosis Date   Anemia    Arthritis    "joints pains; comes w/age" (05/09/2014)   Coronary artery disease    GERD (gastroesophageal reflux disease)    H/O hiatal hernia 1980   Heart attack ([place]) 10/29/05   anteroseptal myocardial infaction Taxus stent to LAD   History of blood transfusion 1980?   "while hospitalized w/kidney stones, tore my hiatal hernia"   History of gout    Hyperlipidemia    Hypertension    Ischemic heart disease    Kidney stones    Lower extremity edema 08/23/2015   OSA on CPAP    Skin cancer    "burnt most of them off; face, arms"    Past Surgical History:  Procedure Laterality Date   CARDIOVASCULAR STRESS TEST Left 05/28/2009   EF 57% no reversible ischemia   CATARACT EXTRACTION W/ INTRAOCULAR LENS IMPLANT Left 1997   CORONARY ANGIOPLASTY WITH STENT PLACEMENT Bilateral 2007   "1"   CORONARY STENT INTERVENTION N/A 05/11/2018   Procedure: CORONARY STENT INTERVENTION;  Surgeon: Leonie Man, MD;  Location: [facility];  Service: Cardiovascular;  Laterality: N/A;   DOPPLER ECHOCARDIOGRAPHY  01/06/2007  ESOPHAGEAL MANOMETRY N/A 01/05/2019   Procedure: ESOPHAGEAL MANOMETRY (EM);  Surgeon: Laurence Spates, MD;  Location: [facility];  Service: Endoscopy;  Laterality: N/A;   EYE SURGERY Left 1978   "injury"   HERNIA REPAIR     INGUINAL HERNIA REPAIR Bilateral 05/09/2014   INGUINAL HERNIA REPAIR Bilateral 05/09/2014   Procedure: LAPAROSCOPIC BILATERAL INGUINAL HERNIA REPAIR;  Surgeon: [name], MD;  Location: [facility];  Service: General;  Laterality: Bilateral;   INSERTION OF MESH N/A 05/09/2014   Procedure: INSERTION OF MESH;  Surgeon: [name], MD;  Location: [facility];  Service: General;  Laterality: N/A;  INSERTION OF MESH TO  ABDOMEN AND BILATERAL GROIN   LEFT HEART CATH AND CORONARY ANGIOGRAPHY N/A 05/11/2018   Procedure: LEFT HEART CATH AND CORONARY ANGIOGRAPHY;  Surgeon: Leonie Man, MD;  Location: [facility];  Service: Cardiovascular;  Laterality: N/A;   SHOULDER ARTHROSCOPY W/ ROTATOR CUFF REPAIR Right 06/2010   SKIN CANCER EXCISION Left    "arm"   TONSILLECTOMY  [id number]   UMBILICAL HERNIA REPAIR  [id number]   UMBILICAL HERNIA REPAIR N/A 05/09/2014   Procedure: HERNIA REPAIR UMBILICAL ADULT;  Surgeon: [name], MD;  Location: [facility];  Service: General;  Laterality: N/A;     Current Outpatient Medications  Medication Sig Dispense Refill   amLODipine (NORVASC) 10 MG tablet Take 0.5 tablets (5 mg total) by mouth daily. 90 tablet 1   aspirin 81 MG EC tablet Take 81 mg by mouth daily.       carvedilol (COREG) 12.5 MG tablet TAKE 1 TABLET BY MOUTH TWICE (Patient taking differently: Take 12.5 mg by mouth 2 (two) times daily with a meal. ) 60 tablet 5   Cholecalciferol (VITAMIN D) 2000 UNITS tablet Take 2,000 Units by mouth daily.       clopidogrel (PLAVIX) 75 MG tablet TAKE 1 TABLET BY MOUTH DAILY 30 tablet 0   colchicine 0.6 MG tablet Take 0.6 mg by mouth 2 (two) times daily as needed (gout). Colcrys     fexofenadine (ALLEGRA) 180 MG tablet Take 180 mg by mouth daily as needed for allergies.      furosemide (LASIX) 40 MG tablet Take 1 tablet (40 mg total) by mouth daily as needed for fluid or edema. 30 tablet 5   nitroGLYCERIN (NITROSTAT) 0.4 MG SL tablet Place 0.4 mg under the tongue every 5 (five) minutes as needed for chest pain.      simvastatin (ZOCOR) 20 MG tablet TAKE 1 TABLET BY MOUTH EVERYDAY AT BEDTIME (Patient taking differently: Take 20 mg by mouth daily at 6 PM. ) 30 tablet 2   valACYclovir (VALTREX) 1000 MG tablet Take 1,000 mg by mouth 2 (two) times daily as needed (fever blisters).      No current facility-administered medications for this visit.     Allergies:   Ace  inhibitors, Lisinopril, and Losartan    Social History:  The patient  reports that he quit smoking about 50 years ago. His smoking use included cigarettes. He has a 5.00 pack-year smoking history. He quit smokeless tobacco use about 40 years ago.  His smokeless tobacco use included chew. He reports current alcohol use. He reports that he does not use drugs.   Family History:  The patient's family history includes Diabetes in his father and mother; Heart disease in his father and mother.    ROS:  Please see the history of present illness.   Otherwise, review of systems are positive for none.  All other systems are reviewed and negative.    PHYSICAL EXAM: VS:  BP 127/80    Pulse (!) 54    Temp (!) 97 F (36.1 C)    Ht 5\' 7"  (1.702 m)    Wt 198 lb 9.6 oz (90.1 kg)    SpO2 97%    BMI 31.11 kg/m  , BMI Body mass index is 31.11 kg/m. GENERAL:  Well appearing HEENT: Pupils equal round and reactive, fundi not visualized, oral mucosa unremarkable NECK:  No jugular venous distention, waveform within normal limits, carotid upstroke brisk and symmetric, no bruits LUNGS:  Clear to auscultation bilaterally HEART:  RRR.  PMI not displaced or sustained,S1 and S2 within normal limits, no S3, no S4, no clicks, no rubs, no murmurs ABD:  Flat, positive bowel sounds normal in frequency in pitch, no bruits, no rebound, no guarding, no midline pulsatile mass, no hepatomegaly, no splenomegaly EXT:  2 plus pulses throughout, no edema, no cyanosis no clubbing SKIN:  No rashes no nodules NEURO:  Cranial nerves II through XII grossly intact, motor grossly intact throughout PSYCH:  Cognitively intact, oriented to person place and time   EKG:  EKG is not ordered today. 12/27/15: Sinus bradycardia.  Rate 49 bpm.  Prior septal infarct.  Prior lateral infarct 09/29/16: Sinus bradycardia. Rate 54 bpm. Left anterior fascicular block. Prior septal infarct. 04/24/17: Sinus bradycardia.  Rate 53 bpm.  Prior anterior infarct.   Prior lateral infarct.    30 Day Event Monitor 04/28/18:  Quality: Fair.  Baseline artifact. Predominant rhythm: sinus rhythm Average heart rate: 60 bpm Max heart rate: 99 bpm Min heart rate: 47bpm  Occasional PACs and PVCs Up to 5 beats of NSVT   Echo 05/04/18: Study Conclusions  - Left ventricle: The cavity size was normal. Wall thickness was   increased in a pattern of mild LVH. There was mild focal basal   hypertrophy of the septum. Systolic function was mildly reduced.   The estimated ejection fraction was in the range of 45% to 50%.   There is hypokinesis of the apical myocardium. Doppler parameters   are consistent with abnormal left ventricular relaxation (grade 1   diastolic dysfunction). - Aortic valve: There was trivial regurgitation. 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 - Left atrium: The atrium was mildly dilated. - Pulmonary arteries: Systolic pressure was mildly increased. PA   peak pressure: 33 mm Hg (S).  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   LHC 04/2018:  Prox RCA lesion is 75% stenosed.  A drug-eluting stent was successfully placed using a STENT ORSIRO 3.0X18. -Postdilated to 3.6 mm  Post intervention, there is a 0% residual stenosis.  Mid RCA lesion is 20% stenosed. Dist RCA lesion is 20% stenosed with 20% stenosed side branch in Ost RPDA.  There is mild left ventricular systolic dysfunction. The left ventricular ejection fraction is 45-50% by visual estimate. LV end diastolic pressure is moderately elevated.  Prox LAD to Mid LAD long stented segment is 10% stenosed.   SUMMARY  Two-vessel disease with widely patent stent in the proximal LAD and 75-80% proximal RCA stenosis.  Successful DES PCI of  proximal RCA using Osiro DES 3.0 mm x 18 mm postdilated to 3.6 mm.  Moderately elevated LVEDP.  Low  normal EF of 45 and 50%.  Difficult to assess regional wall motion normality.  RECOMMENDATIONS  Continue aspirin plus Plavix, if the plan will be to convert to DOAC because of A. fib, would simply stop aspirin after 1 month.  Uncomplicated PCI, okay for discharge home today as same-day discharge stable.  Continue respect modification.  Continue beta-blocker  Recent Labs: 04/18/2018: B Natriuretic Peptide 319.3 05/03/2018: Magnesium 2.2; TSH 2.530 07/01/2018: BUN 25; Creatinine, Ser 1.00; Hemoglobin 16.2; Platelets 168; Potassium 4.2; Sodium 140   08/17/15: WBC 5.2, hematocrit 47.8, hemoglobin 16.4, platelets 172 TSH 2.33 Sodium 141, potassium 4.8, BUN 16, creatinine 0.99 Albumin 3.7 AST 16, ALT 21 Triglycerides 117, HDL 37, LDL 59, total cholesterol 119  09/29/16: Total cholesterol 112, triglycerides 106, HDL 43, LDL 48 WBC 5.8, hemoglobin 16.7, hematocrit 48.4, platelets 181 Sodium 137, potassium 4.5, BUN 17, creatinine 0.91 AST 11, ALT 20  08/11/2017: WBC 5.8, hemoglobin 16.5, hematocrit 48.1, platelets 167 Sodium 139, potassium 4.1, BUN 13, creatinine 0.919 AST 15, ALT 22 Total cholesterol 112, triglycerides 93, HDL 36, LDL 57 TSH 2.37  Lipid Panel    Component Value Date/Time   CHOL 111 (L) 04/19/2015 0801   TRIG 84 04/19/2015 0801   HDL 30 (L) 04/19/2015 0801   CHOLHDL 3.7 04/19/2015 0801   VLDL 17 04/19/2015 0801   LDLCALC 64 04/19/2015 0801      Wt Readings from Last 3 Encounters:  01/17/19 198 lb 9.6 oz (90.1 kg)  09/09/18 199 lb (90.3 kg)  07/01/18 200 lb (90.7 kg)      ASSESSMENT AND PLAN:  # CAD: # Hyperlipidemia: S/p RCA PCI 04/2018.  Prior MI with LAD stent.  Continue aspirin and clopidogrel through 04/2010 then ASA only. Continue carvedilol and simvastatin.  Check lipids and CMP today.  LDL goal <70.  # Shortness of breath: Likely attributable to  his hiatal hernia.  However he is considering surgery soon and he did not have chest pain even when he had ischemia.  We will get a Lexiscan Myoview to assess for ischemia.  # Bradycardia: Heart rate stable.  He has no lightheadedness or dizziness.  Continue carvedilol 12.5mg  bid.  If he becomes symptomatic we will reduce carvedilol and increase amlodipine.    # Dizziness: Reduced since lowering amlodipine.    # LE edema: Improved.  He has not needed to use Lasix recently.  I suspect this is due to chronic venous insufficiency.   # Hypertension: Blood pressure is well-controlled on amlodipine and carvedilol.     Current medicines are reviewed at length with the patient today.  The patient does not have concerns regarding medicines.  The following changes have been made:  no change  Labs/ tests ordered today include:   Orders Placed This Encounter  Procedures   Lipid panel   Comprehensive metabolic panel   MYOCARDIAL PERFUSION IMAGING     Disposition:   FU with Toneka Fullen C. Oval Linsey, MD, [facility] in 6 months.     Signed, Pablo Mathurin C. Oval Linsey, MD, [facility]  01/17/2019 9:38 AM    [place]

## 2019-01-17 ENCOUNTER — Encounter: Payer: Self-pay | Admitting: Cardiovascular Disease

## 2019-01-17 ENCOUNTER — Other Ambulatory Visit: Payer: Self-pay

## 2019-01-17 ENCOUNTER — Ambulatory Visit (INDEPENDENT_AMBULATORY_CARE_PROVIDER_SITE_OTHER): Payer: Medicare Other | Admitting: Cardiovascular Disease

## 2019-01-17 VITALS — BP 127/80 | HR 54 | Temp 97.0°F | Ht 67.0 in | Wt 198.6 lb

## 2019-01-17 DIAGNOSIS — R0602 Shortness of breath: Secondary | ICD-10-CM

## 2019-01-17 DIAGNOSIS — E7800 Pure hypercholesterolemia, unspecified: Secondary | ICD-10-CM

## 2019-01-17 DIAGNOSIS — Z5181 Encounter for therapeutic drug level monitoring: Secondary | ICD-10-CM | POA: Diagnosis not present

## 2019-01-17 DIAGNOSIS — I10 Essential (primary) hypertension: Secondary | ICD-10-CM

## 2019-01-17 NOTE — Patient Instructions (Signed)
Medication Instructions:  OK TO STOP PLAVIX  (CLOPIDOGREL) 05/23/2019   If you need a refill on your cardiac medications before your next appointment, please call your pharmacy.   Lab work: FASTING LP/CMET  If you have labs (blood work) drawn today and your tests are completely normal, you will receive your results only by: Marland Kitchen MyChart Message (if you have MyChart) OR . A paper copy in the mail If you have any lab test that is abnormal or we need to change your treatment, we will call you to review the results.  Testing/Procedures: Your physician has requested that you have a lexiscan myoview. For further information please visit [email or web address]. Please follow instruction sheet, as given.  Follow-Up: At [facility], you and your health needs are our priority.  As part of our continuing mission to provide you with exceptional heart care, we have created designated Provider Care Teams.  These Care Teams include your primary Cardiologist (physician) and Advanced Practice Providers (APPs -  Physician Assistants and Nurse Practitioners) who all work together to provide you with the care you need, when you need it. You will need a follow up appointment in 6 months.  Please call our office 2 months in advance to schedule this appointment.  You may see [name], MD or one of the following Advanced Practice Providers on your designated Care Team:   Kerin Ransom, PA-C Roby Lofts, [place] . Sande Rives, PA-C  Any Other Special Instructions Will Be Listed Below (If Applicable).   Cardiac Nuclear Scan A cardiac nuclear scan is a test that is done to check the flow of blood to your heart. It is done when you are resting and when you are exercising. The test looks for problems such as:  Not enough blood reaching a portion of the heart.  The heart muscle not working as it should. You may need this test if:  You have heart disease.  You have had lab results that are not normal.   You have had heart surgery or a balloon procedure to open up blocked arteries (angioplasty).  You have chest pain.  You have shortness of breath. In this test, a special dye (tracer) is put into your bloodstream. The tracer will travel to your heart. A camera will then take pictures of your heart to see how the tracer moves through your heart. This test is usually done at a hospital and takes 2-4 hours. Tell a doctor about:  Any allergies you have.  All medicines you are taking, including vitamins, herbs, eye drops, creams, and over-the-counter medicines.  Any problems you or family members have had with anesthetic medicines.  Any blood disorders you have.  Any surgeries you have had.  Any medical conditions you have.  Whether you are pregnant or may be pregnant. What are the risks? Generally, this is a safe test. However, problems may occur, such as:  Serious chest pain and heart attack. This is only a risk if the stress portion of the test is done.  Rapid heartbeat.  A feeling of warmth in your chest. This feeling usually does not last long.  Allergic reaction to the tracer. What happens before the test?  Ask your doctor about changing or stopping your normal medicines. This is important.  Follow instructions from your doctor about what you cannot eat or drink.  Remove your jewelry on the day of the test. What happens during the test?  An IV tube will be inserted into one of your veins.  Your doctor will give you a small amount of tracer through the IV tube.  You will wait for 20-40 minutes while the tracer moves through your bloodstream.  Your heart will be monitored with an electrocardiogram (ECG).  You will lie down on an exam table.  Pictures of your heart will be taken for about 15-20 minutes.  You may also have a stress test. For this test, one of these things may be done: ? You will be asked to exercise on a treadmill or a stationary bike. ? You will be  given medicines that will make your heart work harder. This is done if you are unable to exercise.  When blood flow to your heart has peaked, a tracer will again be given through the IV tube.  After 20-40 minutes, you will get back on the exam table. More pictures will be taken of your heart.  Depending on the tracer that is used, more pictures may need to be taken 3-4 hours later.  Your IV tube will be removed when the test is over. The test may vary among doctors and hospitals. What happens after the test?  Ask your doctor: ? Whether you can return to your normal schedule, including diet, activities, and medicines. ? Whether you should drink more fluids. This will help to remove the tracer from your body. Drink enough fluid to keep your pee (urine) pale yellow.  Ask your doctor, or the department that is doing the test: ? When will my results be ready? ? How will I get my results? Summary  A cardiac nuclear scan is a test that is done to check the flow of blood to your heart.  Tell your doctor whether you are pregnant or may be pregnant.  Before the test, ask your doctor about changing or stopping your normal medicines. This is important.  Ask your doctor whether you can return to your normal activities. You may be asked to drink more fluids. This information is not intended to replace advice given to you by your health care provider. Make sure you discuss any questions you have with your health care provider. Document Released: 09/21/2017 Document Revised: 07/28/2018 Document Reviewed: 09/21/2017 Elsevier Patient Education  2020 [employer].

## 2019-01-18 ENCOUNTER — Telehealth (HOSPITAL_COMMUNITY): Payer: Self-pay | Admitting: *Deleted

## 2019-01-18 LAB — LIPID PANEL
Chol/HDL Ratio: 3.4 ratio (ref 0.0–5.0)
Cholesterol, Total: 124 mg/dL (ref 100–199)
HDL: 37 mg/dL — ABNORMAL LOW (ref >39)
LDL Chol Calc (NIH): 70 mg/dL (ref 0–99)
Triglycerides: 89 mg/dL (ref 0–149)
VLDL Cholesterol Cal: 17 mg/dL (ref 5–40)

## 2019-01-18 LAB — COMPREHENSIVE METABOLIC PANEL
ALT: 16 IU/L (ref 0–44)
AST: 14 IU/L (ref 0–40)
Albumin/Globulin Ratio: 1.9 (ref 1.2–2.2)
Albumin: 4.1 g/dL (ref 3.7–4.7)
Alkaline Phosphatase: 73 IU/L (ref 39–117)
BUN/Creatinine Ratio: 19 (ref 10–24)
BUN: 18 mg/dL (ref 8–27)
Bilirubin Total: 0.4 mg/dL (ref 0.0–1.2)
CO2: 22 mmol/L (ref 20–29)
Calcium: 9.2 mg/dL (ref 8.6–10.2)
Chloride: 103 mmol/L (ref 96–106)
Creatinine, Ser: 0.94 mg/dL (ref 0.76–1.27)
GFR calc Af Amer: 93 mL/min/{1.73_m2} (ref >59)
GFR calc non Af Amer: 80 mL/min/{1.73_m2} (ref >59)
Globulin, Total: 2.2 g/dL (ref 1.5–4.5)
Glucose: 105 mg/dL — ABNORMAL HIGH (ref 65–99)
Potassium: 4.6 mmol/L (ref 3.5–5.2)
Sodium: 137 mmol/L (ref 134–144)
Total Protein: 6.3 g/dL (ref 6.0–8.5)

## 2019-01-18 NOTE — Telephone Encounter (Signed)
Close encounter 

## 2019-01-19 ENCOUNTER — Other Ambulatory Visit: Payer: Self-pay

## 2019-01-19 ENCOUNTER — Ambulatory Visit (HOSPITAL_COMMUNITY)
Admission: RE | Admit: 2019-01-19 | Discharge: 2019-01-19 | Disposition: A | Discharge status: Home / Self Care | Payer: Medicare Other | Source: Ambulatory Visit | Attending: Cardiovascular Disease | Admitting: Cardiovascular Disease

## 2019-01-19 DIAGNOSIS — E7800 Pure hypercholesterolemia, unspecified: Secondary | ICD-10-CM | POA: Diagnosis present

## 2019-01-19 DIAGNOSIS — R0602 Shortness of breath: Secondary | ICD-10-CM | POA: Insufficient documentation

## 2019-01-19 DIAGNOSIS — I10 Essential (primary) hypertension: Secondary | ICD-10-CM | POA: Diagnosis not present

## 2019-01-19 LAB — MYOCARDIAL PERFUSION IMAGING
Peak HR: 74 {beats}/min
Rest HR: 52 {beats}/min
SDS: 4
SRS: 9
SSS: 13
TID: 1.06

## 2019-01-19 MED ORDER — TECHNETIUM TC 99M TETROFOSMIN IV KIT
9.9000 | PACK | Freq: Once | INTRAVENOUS | Status: AC | PRN
  Administered 2019-01-19: 9.9 via INTRAVENOUS
  Filled 2019-01-19: qty 10

## 2019-01-19 MED ORDER — REGADENOSON 0.4 MG/5ML IV SOLN
0.4000 mg | Freq: Once | INTRAVENOUS | Status: AC
  Administered 2019-01-19: 0.4 mg via INTRAVENOUS

## 2019-01-19 MED ORDER — TECHNETIUM TC 99M TETROFOSMIN IV KIT
31.2000 | PACK | Freq: Once | INTRAVENOUS | Status: AC | PRN
  Administered 2019-01-19: 31.2 via INTRAVENOUS
  Filled 2019-01-19: qty 32

## 2019-01-24 ENCOUNTER — Telehealth: Payer: Self-pay | Admitting: Cardiovascular Disease

## 2019-01-24 ENCOUNTER — Other Ambulatory Visit: Payer: Self-pay | Admitting: Cardiovascular Disease

## 2019-01-24 NOTE — Telephone Encounter (Signed)
OK to hold Plavix prior to surgery.

## 2019-01-24 NOTE — Telephone Encounter (Signed)
° °  [facility] Pre-operative Risk Assessment    Request for surgical clearance:  1. What type of surgery is being performed? Hiatal hernia repair   2. When is this surgery scheduled? TBD   3. What type of clearance is required (medical clearance vs. Pharmacy clearance to hold med vs. Both)? Both   4. Are there any medications that need to be held prior to surgery and how long? PLAVIX   5. Practice name and name of physician performing surgery? Dr. Clyda Greener   6. What is your office phone number [phone]   7.   What is your office fax number [phone]  8.   Anesthesia type (None, local, MAC, general) ? General    Douglas [name] 01/24/2019, 9:26 AM  _________________________________________________________________   (provider comments below)

## 2019-01-24 NOTE — Telephone Encounter (Signed)
Dr. Oval Linsey  Can you please comment on this patient's Plavix therapy?  Preoperative team is been contacted for an upcoming hiatal hernia repair and are requesting Plavix be held for an undetermined amount of time.  You last saw this patient 01/17/2019 in follow-up for CAD.  He has a history of recent RCA PCI 04/2018 and prior MI with LAD stent.  Plan was to continue DAPT with aspirin and Plavix through 04/2019 then ASA only.   In follow-up, he had no anginal symptoms and was doing fairly well from a cardiac perspective.  Please send your recommendations to preoperative pool  Thank you Sharee Pimple

## 2019-01-25 NOTE — Telephone Encounter (Signed)
   Primary Cardiologist: [name], MD  Chart reviewed as part of pre-operative protocol coverage. Given past medical history and time since last visit, based on ACC/AHA guidelines, Tevita Korzeniewski would be at acceptable risk for the planned procedure without further cardiovascular testing.   Per Dr. Oval Linsey, it is acceptable to hold the patients Plavix up to 5 days prior to procedure then resume when ok per surgical team thereafter.   I will route this recommendation to the requesting party via Epic fax function and remove from pre-op pool.  Please call with questions.  Kathyrn Drown, NP 01/25/2019, 11:37 AM

## 2019-02-07 ENCOUNTER — Ambulatory Visit: Payer: Self-pay | Admitting: Surgery

## 2019-02-09 ENCOUNTER — Ambulatory Visit: Payer: Self-pay | Admitting: Surgery

## 2019-03-25 NOTE — Progress Notes (Signed)
PCP - Claris Gower Cardiologist - [name] Clearance note Douglas [name] ,NP 01-25-19 epic  Chest x-ray -CT chest 10-27-18 epic  EKG - 07-02-18 epic Stress Test - 01-19-19 epic ECHO - 05-04-18 epic Cardiac Cath - 05-11-18 epic  Sleep Study -  CPAP - yes  Fasting Blood Sugar -  Checks Blood Sugar _____ times a day  Blood Thinner Instructions:Plavix Hold 5 days prior Aspirin Instructions: Last Dose: 03-25-19  Anesthesia review: CAD MI , stent osa cpap cardiac stents x2  Patient denies shortness of breath, fever, cough and chest pain at PAT appointment  NONE   Patient verbalized understanding of instructions that were given to them at the PAT appointment. Patient was also instructed that they will need to review over the PAT instructions again at home before surgery.

## 2019-03-25 NOTE — Patient Instructions (Signed)
DUE TO COVID-19 ONLY ONE VISITOR IS ALLOWED TO COME WITH YOU AND STAY IN THE WAITING ROOM ONLY DURING PRE OP AND PROCEDURE DAY OF SURGERY. THE 1 VISITOR MAY VISIT WITH YOU AFTER SURGERY IN YOUR PRIVATE ROOM DURING VISITING HOURS ONLY!  YOU NEED TO HAVE A COVID 19 TEST ON_______ @_______ , THIS TEST MUST BE DONE BEFORE SURGERY, COME  [place] , [id number].  ([place]) ONCE YOUR COVID TEST IS COMPLETED, PLEASE BEGIN THE QUARANTINE INSTRUCTIONS AS OUTLINED IN YOUR HANDOUT.                Christin Jones [facility]  03/25/2019   Your procedure is scheduled on: 03-30-19   Report to [facility] Main  Entrance   Report to admitting at      1000 AM     Call this number if you have problems the morning of surgery [phone]    Remember: Do not eat food or drink liquids :After Midnight.  BRUSH YOUR TEETH MORNING OF SURGERY AND RINSE YOUR MOUTH OUT, NO CHEWING GUM CANDY OR MINTS.     Take these medicines the morning of surgery with A SIP OF WATER: carvedilol, amlodipine                                 You may not have any metal on your body including hair pins and              piercings  Do not wear jewelry,lotions, powders or perfumes, deodorant              Men may shave face and neck.   Do not bring valuables to the hospital. [place].  Contacts, dentures or bridgework may not be worn into surgery.                 Please read over the following fact sheets you were given: _____________________________________________________________________          [facility] - Preparing for Surgery Before surgery, you can play an important role.  Because skin is not sterile, your skin needs to be as free of germs as possible.  You can reduce the number of germs on your skin by washing with CHG (chlorahexidine gluconate) soap before surgery.  CHG is an antiseptic cleaner which kills germs and bonds with the  skin to continue killing germs even after washing. Please DO NOT use if you have an allergy to CHG or antibacterial soaps.  If your skin becomes reddened/irritated stop using the CHG and inform your nurse when you arrive at Short Stay. Do not shave (including legs and underarms) for at least 48 hours prior to the first CHG shower.  You may shave your face/neck. Please follow these instructions carefully:  1.  Shower with CHG Soap the night before surgery and the  morning of Surgery.  2.  If you choose to wash your hair, wash your hair first as usual with your  normal  shampoo.  3.  After you shampoo, rinse your hair and body thoroughly to remove the  shampoo.                           4.  Use CHG as you would any other liquid  soap.  You can apply chg directly  to the skin and wash                       Gently with a scrungie or clean washcloth.  5.  Apply the CHG Soap to your body ONLY FROM THE NECK DOWN.   Do not use on face/ open                           Wound or open sores. Avoid contact with eyes, ears mouth and genitals (private parts).                       Wash face,  Genitals (private parts) with your normal soap.             6.  Wash thoroughly, paying special attention to the area where your surgery  will be performed.  7.  Thoroughly rinse your body with warm water from the neck down.  8.  DO NOT shower/wash with your normal soap after using and rinsing off  the CHG Soap.                9.  Pat yourself dry with a clean towel.            10.  Wear clean pajamas.            11.  Place clean sheets on your bed the night of your first shower and do not  sleep with pets. Day of Surgery : Do not apply any lotions/deodorants the morning of surgery.  Please wear clean clothes to the hospital/surgery center.  FAILURE TO FOLLOW THESE INSTRUCTIONS MAY RESULT IN THE CANCELLATION OF YOUR SURGERY PATIENT SIGNATURE_________________________________  NURSE  SIGNATURE__________________________________  ________________________________________________________________________

## 2019-03-26 ENCOUNTER — Other Ambulatory Visit (HOSPITAL_COMMUNITY)
Admission: RE | Admit: 2019-03-26 | Discharge: 2019-03-26 | Disposition: A | Discharge status: Home / Self Care | Payer: Medicare Other | Source: Ambulatory Visit | Attending: Surgery | Admitting: Surgery

## 2019-03-26 DIAGNOSIS — Z20828 Contact with and (suspected) exposure to other viral communicable diseases: Secondary | ICD-10-CM | POA: Diagnosis not present

## 2019-03-26 DIAGNOSIS — Z01812 Encounter for preprocedural laboratory examination: Secondary | ICD-10-CM | POA: Diagnosis present

## 2019-03-28 ENCOUNTER — Other Ambulatory Visit: Payer: Self-pay

## 2019-03-28 ENCOUNTER — Encounter (HOSPITAL_COMMUNITY): Payer: Self-pay

## 2019-03-28 ENCOUNTER — Encounter (HOSPITAL_COMMUNITY)
Admission: RE | Admit: 2019-03-28 | Discharge: 2019-03-28 | Disposition: A | Discharge status: Home / Self Care | Payer: Medicare Other | Source: Ambulatory Visit | Attending: Surgery | Admitting: Surgery

## 2019-03-28 DIAGNOSIS — I2510 Atherosclerotic heart disease of native coronary artery without angina pectoris: Secondary | ICD-10-CM | POA: Insufficient documentation

## 2019-03-28 DIAGNOSIS — E785 Hyperlipidemia, unspecified: Secondary | ICD-10-CM | POA: Insufficient documentation

## 2019-03-28 DIAGNOSIS — Z7982 Long term (current) use of aspirin: Secondary | ICD-10-CM | POA: Insufficient documentation

## 2019-03-28 DIAGNOSIS — Z85828 Personal history of other malignant neoplasm of skin: Secondary | ICD-10-CM | POA: Insufficient documentation

## 2019-03-28 DIAGNOSIS — M109 Gout, unspecified: Secondary | ICD-10-CM | POA: Insufficient documentation

## 2019-03-28 DIAGNOSIS — I252 Old myocardial infarction: Secondary | ICD-10-CM | POA: Insufficient documentation

## 2019-03-28 DIAGNOSIS — K449 Diaphragmatic hernia without obstruction or gangrene: Secondary | ICD-10-CM | POA: Insufficient documentation

## 2019-03-28 DIAGNOSIS — Z01812 Encounter for preprocedural laboratory examination: Secondary | ICD-10-CM | POA: Insufficient documentation

## 2019-03-28 DIAGNOSIS — Z7902 Long term (current) use of antithrombotics/antiplatelets: Secondary | ICD-10-CM | POA: Insufficient documentation

## 2019-03-28 DIAGNOSIS — Z79899 Other long term (current) drug therapy: Secondary | ICD-10-CM | POA: Insufficient documentation

## 2019-03-28 DIAGNOSIS — Z87891 Personal history of nicotine dependence: Secondary | ICD-10-CM | POA: Insufficient documentation

## 2019-03-28 DIAGNOSIS — M1990 Unspecified osteoarthritis, unspecified site: Secondary | ICD-10-CM | POA: Insufficient documentation

## 2019-03-28 DIAGNOSIS — I10 Essential (primary) hypertension: Secondary | ICD-10-CM | POA: Insufficient documentation

## 2019-03-28 DIAGNOSIS — G4733 Obstructive sleep apnea (adult) (pediatric): Secondary | ICD-10-CM | POA: Insufficient documentation

## 2019-03-28 HISTORY — DX: Personal history of urinary calculi: Z87.442

## 2019-03-28 HISTORY — DX: Pneumonia, unspecified organism: J18.9

## 2019-03-28 LAB — BASIC METABOLIC PANEL
Anion gap: 10 (ref 5–15)
BUN: 21 mg/dL (ref 8–23)
CO2: 24 mmol/L (ref 22–32)
Calcium: 8.9 mg/dL (ref 8.9–10.3)
Chloride: 104 mmol/L (ref 98–111)
Creatinine, Ser: 0.81 mg/dL (ref 0.61–1.24)
GFR calc Af Amer: >60 mL/min (ref >60)
GFR calc non Af Amer: >60 mL/min (ref >60)
Glucose, Bld: 138 mg/dL — ABNORMAL HIGH (ref 70–99)
Potassium: 4.5 mmol/L (ref 3.5–5.1)
Sodium: 138 mmol/L (ref 135–145)

## 2019-03-28 LAB — CBC
HCT: 47.7 % (ref 39.0–52.0)
Hemoglobin: 15.6 g/dL (ref 13.0–17.0)
MCH: 30 pg (ref 26.0–34.0)
MCHC: 32.7 g/dL (ref 30.0–36.0)
MCV: 91.7 fL (ref 80.0–100.0)
Platelets: 181 10*3/uL (ref 150–400)
RBC: 5.2 MIL/uL (ref 4.22–5.81)
RDW: 13.5 % (ref 11.5–15.5)
WBC: 5.4 10*3/uL (ref 4.0–10.5)
nRBC: 0 % (ref 0.0–0.2)

## 2019-03-28 LAB — NOVEL CORONAVIRUS, NAA (HOSP ORDER, SEND-OUT TO REF LAB; TAT 18-24 HRS): SARS-CoV-2, NAA: NOT DETECTED

## 2019-03-29 NOTE — Anesthesia Preprocedure Evaluation (Addendum)
Anesthesia Evaluation  Patient identified by MRN, date of birth, ID band Patient awake    Reviewed: Allergy & Precautions, H&P , NPO status , Patient's Chart, lab work & pertinent test results, reviewed documented beta blocker date and time   Airway Mallampati: II  TM Distance: >3 FB Neck ROM: full    Dental no notable dental hx.    Pulmonary sleep apnea and Continuous Positive Airway Pressure Ventilation , former smoker,    Pulmonary exam normal breath sounds clear to auscultation       Cardiovascular Exercise Tolerance: Good hypertension, Pt. on medications + CAD and + Past MI   Rhythm:regular Rate:Normal  EKG: 07/02/2018 Rate 76 bpm Sinus rhythm with fusion complexes Septal infarct, age undetermined Lateral infarct, age undetermined  CV: Myocardial Perfusion Imaging 01/19/2019  The left ventricular ejection fraction is mildly decreased (45-54%).  Nuclear stress EF: 50%.  There was no ST segment deviation noted during stress.  No T wave inversion was noted during stress.  Defect 1: There is a large defect of severe severity.  Findings consistent with prior myocardial infarction with peri-infarct ischemia.  This is an intermediate risk study.  Cardiac Cath 05/11/2018  Prox RCA lesion is 75% stenosed.  A drug-eluting stent was successfully placed using a STENT ORSIRO 3.0X18. -Postdilated to 3.6 mm  Post intervention, there is a 0% residual stenosis.  Mid RCA lesion is 20% stenosed. Dist RCA lesion is 20% stenosed with 20% stenosed side branch in Ost RPDA.  There is mild left ventricular systolic dysfunction. The left ventricular ejection fraction is 45-50% by visual estimate. LV end diastolic pressure is moderately elevated.  Prox LAD to Mid LAD long stented segment is 10% stenosed.  Echo 05/04/2018  Left ventricle: The cavity size was normal. Wall thickness was increased in a pattern of mild LVH. There  was mild focal basal hypertrophy of the septum. Systolic function was mildly reduced. The estimated ejection fraction was in the range of 45% to 50%. There is hypokinesis of the apical myocardium. Doppler parameters are consistent with abnormal left ventricular relaxation (grade 1 diastolic dysfunction).    Neuro/Psych negative neurological ROS  negative psych ROS   GI/Hepatic Neg liver ROS, hiatal hernia, GERD  Medicated and Poorly Controlled,  Endo/Other  negative endocrine ROS  Renal/GU negative Renal ROS  negative genitourinary   Musculoskeletal  (+) Arthritis , Osteoarthritis,    Abdominal   Peds  Hematology  (+) Blood dyscrasia, anemia ,   Anesthesia Other Findings   Reproductive/Obstetrics negative OB ROS                            Anesthesia Physical Anesthesia Plan  ASA: III  Anesthesia Plan: General   Post-op Pain Management:    Induction: Intravenous  PONV Risk Score and Plan: 2 and Ondansetron, Dexamethasone and Treatment may vary due to age or medical condition  Airway Management Planned: Oral ETT  Additional Equipment:   Intra-op Plan:   Post-operative Plan: Extubation in OR  Informed Consent: I have reviewed the patients History and Physical, chart, labs and discussed the procedure including the risks, benefits and alternatives for the proposed anesthesia with the patient or authorized representative who has indicated his/her understanding and acceptance.     Dental Advisory Given  Plan Discussed with: CRNA, Anesthesiologist and Surgeon  Anesthesia Plan Comments: (See  PAT note 03/28/2019, Konrad Felix, PA-C)       Anesthesia Quick Evaluation
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
## 2019-03-30 ENCOUNTER — Encounter (HOSPITAL_COMMUNITY): Payer: Self-pay | Admitting: *Deleted

## 2019-03-30 ENCOUNTER — Encounter (HOSPITAL_COMMUNITY)
Admission: RE | Disposition: A | Discharge status: Home / Self Care | Payer: Self-pay | Source: Home / Self Care | Attending: Surgery

## 2019-03-30 ENCOUNTER — Inpatient Hospital Stay (HOSPITAL_COMMUNITY): Payer: Medicare Other | Admitting: Certified Registered Nurse Anesthetist

## 2019-03-30 ENCOUNTER — Inpatient Hospital Stay (HOSPITAL_COMMUNITY): Payer: Medicare Other | Admitting: Physician Assistant

## 2019-03-30 ENCOUNTER — Other Ambulatory Visit: Payer: Self-pay

## 2019-03-30 ENCOUNTER — Inpatient Hospital Stay (HOSPITAL_COMMUNITY)
Admission: RE | Admit: 2019-03-30 | Discharge: 2019-04-03 | DRG: 326 | Disposition: A | Discharge status: Home / Self Care | Payer: Medicare Other | Attending: Surgery | Admitting: Surgery

## 2019-03-30 DIAGNOSIS — Z833 Family history of diabetes mellitus: Secondary | ICD-10-CM

## 2019-03-30 DIAGNOSIS — E785 Hyperlipidemia, unspecified: Secondary | ICD-10-CM | POA: Diagnosis present

## 2019-03-30 DIAGNOSIS — Z6832 Body mass index (BMI) 32.0-32.9, adult: Secondary | ICD-10-CM | POA: Diagnosis not present

## 2019-03-30 DIAGNOSIS — D509 Iron deficiency anemia, unspecified: Secondary | ICD-10-CM | POA: Diagnosis present

## 2019-03-30 DIAGNOSIS — Z800 Family history of malignant neoplasm of digestive organs: Secondary | ICD-10-CM | POA: Diagnosis not present

## 2019-03-30 DIAGNOSIS — R05 Cough: Secondary | ICD-10-CM | POA: Diagnosis present

## 2019-03-30 DIAGNOSIS — E669 Obesity, unspecified: Secondary | ICD-10-CM | POA: Diagnosis present

## 2019-03-30 DIAGNOSIS — G4733 Obstructive sleep apnea (adult) (pediatric): Secondary | ICD-10-CM | POA: Diagnosis present

## 2019-03-30 DIAGNOSIS — E875 Hyperkalemia: Secondary | ICD-10-CM | POA: Diagnosis not present

## 2019-03-30 DIAGNOSIS — Z888 Allergy status to other drugs, medicaments and biological substances status: Secondary | ICD-10-CM | POA: Diagnosis not present

## 2019-03-30 DIAGNOSIS — I10 Essential (primary) hypertension: Secondary | ICD-10-CM | POA: Diagnosis present

## 2019-03-30 DIAGNOSIS — Z20828 Contact with and (suspected) exposure to other viral communicable diseases: Secondary | ICD-10-CM | POA: Diagnosis present

## 2019-03-30 DIAGNOSIS — R739 Hyperglycemia, unspecified: Secondary | ICD-10-CM | POA: Diagnosis present

## 2019-03-30 DIAGNOSIS — K562 Volvulus: Secondary | ICD-10-CM | POA: Diagnosis present

## 2019-03-30 DIAGNOSIS — Z87891 Personal history of nicotine dependence: Secondary | ICD-10-CM

## 2019-03-30 DIAGNOSIS — Z955 Presence of coronary angioplasty implant and graft: Secondary | ICD-10-CM | POA: Diagnosis not present

## 2019-03-30 DIAGNOSIS — Z9981 Dependence on supplemental oxygen: Secondary | ICD-10-CM | POA: Diagnosis not present

## 2019-03-30 DIAGNOSIS — I480 Paroxysmal atrial fibrillation: Secondary | ICD-10-CM | POA: Diagnosis present

## 2019-03-30 DIAGNOSIS — Z7901 Long term (current) use of anticoagulants: Secondary | ICD-10-CM

## 2019-03-30 DIAGNOSIS — I209 Angina pectoris, unspecified: Secondary | ICD-10-CM

## 2019-03-30 DIAGNOSIS — I255 Ischemic cardiomyopathy: Secondary | ICD-10-CM | POA: Diagnosis present

## 2019-03-30 DIAGNOSIS — K440 Diaphragmatic hernia with obstruction, without gangrene: Principal | ICD-10-CM | POA: Diagnosis present

## 2019-03-30 DIAGNOSIS — Z7902 Long term (current) use of antithrombotics/antiplatelets: Secondary | ICD-10-CM | POA: Diagnosis not present

## 2019-03-30 DIAGNOSIS — I252 Old myocardial infarction: Secondary | ICD-10-CM

## 2019-03-30 DIAGNOSIS — I259 Chronic ischemic heart disease, unspecified: Secondary | ICD-10-CM | POA: Diagnosis present

## 2019-03-30 DIAGNOSIS — R6881 Early satiety: Secondary | ICD-10-CM | POA: Diagnosis present

## 2019-03-30 DIAGNOSIS — B001 Herpesviral vesicular dermatitis: Secondary | ICD-10-CM | POA: Insufficient documentation

## 2019-03-30 DIAGNOSIS — N179 Acute kidney failure, unspecified: Secondary | ICD-10-CM | POA: Diagnosis not present

## 2019-03-30 DIAGNOSIS — K219 Gastro-esophageal reflux disease without esophagitis: Secondary | ICD-10-CM | POA: Diagnosis present

## 2019-03-30 DIAGNOSIS — M1990 Unspecified osteoarthritis, unspecified site: Secondary | ICD-10-CM | POA: Diagnosis present

## 2019-03-30 DIAGNOSIS — I2510 Atherosclerotic heart disease of native coronary artery without angina pectoris: Secondary | ICD-10-CM | POA: Diagnosis present

## 2019-03-30 DIAGNOSIS — D649 Anemia, unspecified: Secondary | ICD-10-CM | POA: Diagnosis present

## 2019-03-30 DIAGNOSIS — Z8249 Family history of ischemic heart disease and other diseases of the circulatory system: Secondary | ICD-10-CM

## 2019-03-30 LAB — TYPE AND SCREEN
ABO/RH(D): A POS
Antibody Screen: NEGATIVE

## 2019-03-30 LAB — CBC
HCT: 33.8 % — ABNORMAL LOW (ref 39.0–52.0)
Hemoglobin: 10.9 g/dL — ABNORMAL LOW (ref 13.0–17.0)
MCH: 30.8 pg (ref 26.0–34.0)
MCHC: 32.2 g/dL (ref 30.0–36.0)
MCV: 95.5 fL (ref 80.0–100.0)
Platelets: 133 10*3/uL — ABNORMAL LOW (ref 150–400)
RBC: 3.54 MIL/uL — ABNORMAL LOW (ref 4.22–5.81)
RDW: 13.7 % (ref 11.5–15.5)
WBC: 15.7 10*3/uL — ABNORMAL HIGH (ref 4.0–10.5)
nRBC: 0 % (ref 0.0–0.2)

## 2019-03-30 LAB — ABO/RH: ABO/RH(D): A POS

## 2019-03-30 LAB — GLUCOSE, CAPILLARY: Glucose-Capillary: 185 mg/dL — ABNORMAL HIGH (ref 70–99)

## 2019-03-30 LAB — POCT I-STAT EG7
Acid-base deficit: 10 mmol/L — ABNORMAL HIGH (ref 0.0–2.0)
Bicarbonate: 19.2 mmol/L — ABNORMAL LOW (ref 20.0–28.0)
Calcium, Ion: 1.15 mmol/L (ref 1.15–1.40)
HCT: 37 % — ABNORMAL LOW (ref 39.0–52.0)
Hemoglobin: 12.6 g/dL — ABNORMAL LOW (ref 13.0–17.0)
O2 Saturation: 81 %
Potassium: 5.9 mmol/L — ABNORMAL HIGH (ref 3.5–5.1)
Sodium: 137 mmol/L (ref 135–145)
TCO2: 21 mmol/L — ABNORMAL LOW (ref 22–32)
pCO2, Ven: 53.2 mmHg (ref 44.0–60.0)
pH, Ven: 7.166 — CL (ref 7.250–7.430)
pO2, Ven: 58 mmHg — ABNORMAL HIGH (ref 32.0–45.0)

## 2019-03-30 LAB — MRSA PCR SCREENING: MRSA by PCR: NEGATIVE

## 2019-03-30 SURGERY — FUNDOPLICATION, NISSEN, ROBOT-ASSISTED, LAPAROSCOPIC
Anesthesia: General | Site: Abdomen

## 2019-03-30 MED ORDER — ACETAMINOPHEN 500 MG PO TABS
1000.0000 mg | ORAL_TABLET | ORAL | Status: AC
  Administered 2019-03-30: 1000 mg via ORAL
  Filled 2019-03-30: qty 2

## 2019-03-30 MED ORDER — ONDANSETRON HCL 4 MG/2ML IJ SOLN: 4.0000 mg | Freq: Four times a day (QID) | INTRAMUSCULAR | Status: DC | PRN

## 2019-03-30 MED ORDER — METHOCARBAMOL 1000 MG/10ML IJ SOLN
1000.0000 mg | Freq: Four times a day (QID) | INTRAVENOUS | Status: DC | PRN
  Filled 2019-03-30: qty 10

## 2019-03-30 MED ORDER — FENTANYL CITRATE (PF) 100 MCG/2ML IJ SOLN
INTRAMUSCULAR | Status: AC
  Filled 2019-03-30: qty 2

## 2019-03-30 MED ORDER — HYDROMORPHONE HCL 1 MG/ML IJ SOLN: 0.5000 mg | INTRAMUSCULAR | Status: DC | PRN

## 2019-03-30 MED ORDER — HYDROCORTISONE (PERIANAL) 2.5 % EX CREA: 1.0000 "application " | TOPICAL_CREAM | Freq: Four times a day (QID) | CUTANEOUS | Status: DC | PRN

## 2019-03-30 MED ORDER — ASPIRIN 81 MG PO TBEC: 81.0000 mg | DELAYED_RELEASE_TABLET | Freq: Every day | ORAL | Status: DC

## 2019-03-30 MED ORDER — LACTATED RINGERS IV SOLN
INTRAVENOUS | Status: DC | PRN
  Administered 2019-03-30 (×3): via INTRAVENOUS

## 2019-03-30 MED ORDER — LACTATED RINGERS IV SOLN
INTRAVENOUS | Status: DC | PRN
  Administered 2019-03-30: 18:00:00 via INTRAVENOUS

## 2019-03-30 MED ORDER — HYDROCORTISONE 1 % EX CREA: 1.0000 "application " | TOPICAL_CREAM | Freq: Three times a day (TID) | CUTANEOUS | Status: DC | PRN

## 2019-03-30 MED ORDER — SIMETHICONE 80 MG PO CHEW: 40.0000 mg | CHEWABLE_TABLET | Freq: Four times a day (QID) | ORAL | Status: DC | PRN

## 2019-03-30 MED ORDER — SUCCINYLCHOLINE CHLORIDE 200 MG/10ML IV SOSY
PREFILLED_SYRINGE | INTRAVENOUS | Status: DC | PRN
  Administered 2019-03-30: 120 mg via INTRAVENOUS

## 2019-03-30 MED ORDER — SODIUM CHLORIDE 0.9 % IV SOLN
INTRAVENOUS | Status: DC
  Administered 2019-03-30: 23:00:00 via INTRAVENOUS

## 2019-03-30 MED ORDER — SODIUM CHLORIDE 0.9 % IV SOLN: 250.0000 mL | INTRAVENOUS | Status: DC | PRN

## 2019-03-30 MED ORDER — EPHEDRINE 5 MG/ML INJ
INTRAVENOUS | Status: AC
  Filled 2019-03-30: qty 10

## 2019-03-30 MED ORDER — LIDOCAINE 2% (20 MG/ML) 5 ML SYRINGE
INTRAMUSCULAR | Status: AC
  Filled 2019-03-30: qty 5

## 2019-03-30 MED ORDER — DEXAMETHASONE SODIUM PHOSPHATE 4 MG/ML IJ SOLN
4.0000 mg | Freq: Two times a day (BID) | INTRAMUSCULAR | Status: AC
  Administered 2019-03-30 – 2019-04-02 (×6): 4 mg via INTRAVENOUS
  Filled 2019-03-30 (×6): qty 1

## 2019-03-30 MED ORDER — PHENYLEPHRINE HCL (PRESSORS) 10 MG/ML IV SOLN
INTRAVENOUS | Status: AC
  Filled 2019-03-30: qty 1

## 2019-03-30 MED ORDER — PHENOL 1.4 % MT LIQD: 1.0000 | OROMUCOSAL | Status: DC | PRN

## 2019-03-30 MED ORDER — SODIUM CHLORIDE 0.9% FLUSH: 3.0000 mL | INTRAVENOUS | Status: DC | PRN

## 2019-03-30 MED ORDER — PROPOFOL 10 MG/ML IV BOLUS
INTRAVENOUS | Status: DC | PRN
  Administered 2019-03-30: 150 mg via INTRAVENOUS

## 2019-03-30 MED ORDER — FENTANYL CITRATE (PF) 250 MCG/5ML IJ SOLN
INTRAMUSCULAR | Status: AC
  Filled 2019-03-30: qty 5

## 2019-03-30 MED ORDER — GABAPENTIN 300 MG PO CAPS: 300.0000 mg | ORAL_CAPSULE | Freq: Two times a day (BID) | ORAL | Status: DC

## 2019-03-30 MED ORDER — LIP MEDEX EX OINT
1.0000 "application " | TOPICAL_OINTMENT | Freq: Two times a day (BID) | CUTANEOUS | Status: DC
  Administered 2019-03-30 – 2019-04-02 (×7): 1 via TOPICAL
  Filled 2019-03-30: qty 7

## 2019-03-30 MED ORDER — MIDAZOLAM HCL 5 MG/5ML IJ SOLN
INTRAMUSCULAR | Status: DC | PRN
  Administered 2019-03-30: 2 mg via INTRAVENOUS

## 2019-03-30 MED ORDER — ONDANSETRON 4 MG PO TBDP: 4.0000 mg | ORAL_TABLET | Freq: Four times a day (QID) | ORAL | Status: DC | PRN

## 2019-03-30 MED ORDER — BUPIVACAINE HCL (PF) 0.25 % IJ SOLN
INTRAMUSCULAR | Status: AC
  Filled 2019-03-30: qty 60

## 2019-03-30 MED ORDER — ONDANSETRON HCL 4 MG PO TABS: 4.0000 mg | ORAL_TABLET | Freq: Three times a day (TID) | ORAL | 10 refills | Status: DC | PRN

## 2019-03-30 MED ORDER — PROMETHAZINE HCL 25 MG RE SUPP: 25.0000 mg | Freq: Four times a day (QID) | RECTAL | 10 refills | Status: DC | PRN

## 2019-03-30 MED ORDER — FENTANYL CITRATE (PF) 100 MCG/2ML IJ SOLN
INTRAMUSCULAR | Status: DC | PRN
  Administered 2019-03-30: 100 ug via INTRAVENOUS
  Administered 2019-03-30 (×3): 50 ug via INTRAVENOUS
  Administered 2019-03-30 (×2): 25 ug via INTRAVENOUS
  Administered 2019-03-30: 100 ug via INTRAVENOUS
  Administered 2019-03-30: 50 ug via INTRAVENOUS

## 2019-03-30 MED ORDER — SUCCINYLCHOLINE CHLORIDE 200 MG/10ML IV SOSY
PREFILLED_SYRINGE | INTRAVENOUS | Status: AC
  Filled 2019-03-30: qty 10

## 2019-03-30 MED ORDER — CARVEDILOL 12.5 MG PO TABS
12.5000 mg | ORAL_TABLET | Freq: Two times a day (BID) | ORAL | Status: DC
  Administered 2019-03-30 – 2019-03-31 (×2): 12.5 mg via ORAL
  Filled 2019-03-30 (×2): qty 1

## 2019-03-30 MED ORDER — PHENYLEPHRINE 40 MCG/ML (10ML) SYRINGE FOR IV PUSH (FOR BLOOD PRESSURE SUPPORT)
PREFILLED_SYRINGE | INTRAVENOUS | Status: AC
  Filled 2019-03-30: qty 20

## 2019-03-30 MED ORDER — FENTANYL CITRATE (PF) 100 MCG/2ML IJ SOLN: 25.0000 ug | INTRAMUSCULAR | Status: DC | PRN

## 2019-03-30 MED ORDER — DEXAMETHASONE SODIUM PHOSPHATE 10 MG/ML IJ SOLN
INTRAMUSCULAR | Status: AC
  Filled 2019-03-30: qty 1

## 2019-03-30 MED ORDER — MENTHOL 3 MG MT LOZG: 1.0000 | LOZENGE | OROMUCOSAL | Status: DC | PRN

## 2019-03-30 MED ORDER — BISACODYL 10 MG RE SUPP: 10.0000 mg | Freq: Two times a day (BID) | RECTAL | Status: DC | PRN

## 2019-03-30 MED ORDER — BUPIVACAINE HCL (PF) 0.25 % IJ SOLN
INTRAMUSCULAR | Status: DC | PRN
  Administered 2019-03-30: 60 mL

## 2019-03-30 MED ORDER — SODIUM CHLORIDE 0.9 % IV SOLN
25.0000 mg | Freq: Once | INTRAVENOUS | Status: AC
  Administered 2019-03-30: 22:00:00 25 mg via INTRAVENOUS
  Filled 2019-03-30: qty 0.5

## 2019-03-30 MED ORDER — PHENYLEPHRINE 40 MCG/ML (10ML) SYRINGE FOR IV PUSH (FOR BLOOD PRESSURE SUPPORT)
PREFILLED_SYRINGE | INTRAVENOUS | Status: DC | PRN
  Administered 2019-03-30 (×2): 80 ug via INTRAVENOUS
  Administered 2019-03-30: 120 ug via INTRAVENOUS
  Administered 2019-03-30 (×2): 80 ug via INTRAVENOUS
  Administered 2019-03-30: 120 ug via INTRAVENOUS
  Administered 2019-03-30: 80 ug via INTRAVENOUS
  Administered 2019-03-30: 120 ug via INTRAVENOUS

## 2019-03-30 MED ORDER — CHLORHEXIDINE GLUCONATE CLOTH 2 % EX PADS
6.0000 | MEDICATED_PAD | Freq: Every day | CUTANEOUS | Status: DC
  Administered 2019-03-31 – 2019-04-02 (×2): 6 via TOPICAL

## 2019-03-30 MED ORDER — ACETAMINOPHEN 160 MG/5ML PO SOLN
1000.0000 mg | Freq: Three times a day (TID) | ORAL | Status: DC
  Administered 2019-03-30 – 2019-04-03 (×11): 1000 mg via ORAL
  Filled 2019-03-30 (×11): qty 40.6

## 2019-03-30 MED ORDER — TRAMADOL HCL 50 MG PO TABS: 50.0000 mg | ORAL_TABLET | Freq: Four times a day (QID) | ORAL | 0 refills | Status: DC | PRN

## 2019-03-30 MED ORDER — ONDANSETRON HCL 4 MG/2ML IJ SOLN
INTRAMUSCULAR | Status: AC
  Filled 2019-03-30: qty 2

## 2019-03-30 MED ORDER — METRONIDAZOLE IN NACL 5-0.79 MG/ML-% IV SOLN
500.0000 mg | INTRAVENOUS | Status: AC
  Administered 2019-03-30: 500 mg via INTRAVENOUS
  Filled 2019-03-30: qty 100

## 2019-03-30 MED ORDER — LIDOCAINE 20MG/ML (2%) 15 ML SYRINGE OPTIME
INTRAMUSCULAR | Status: DC | PRN
  Administered 2019-03-30: 1.5 mg/kg/h via INTRAVENOUS

## 2019-03-30 MED ORDER — POLYETHYLENE GLYCOL 3350 17 G PO PACK: 17.0000 g | PACK | Freq: Every day | ORAL | Status: DC | PRN

## 2019-03-30 MED ORDER — VALACYCLOVIR HCL 500 MG PO TABS
1000.0000 mg | ORAL_TABLET | Freq: Two times a day (BID) | ORAL | Status: DC | PRN
  Filled 2019-03-30: qty 2

## 2019-03-30 MED ORDER — ALBUMIN HUMAN 5 % IV SOLN
INTRAVENOUS | Status: AC
  Filled 2019-03-30: qty 250

## 2019-03-30 MED ORDER — PHENYLEPHRINE HCL (PRESSORS) 10 MG/ML IV SOLN
INTRAVENOUS | Status: DC | PRN
  Administered 2019-03-30 (×3): 120 ug via INTRAVENOUS

## 2019-03-30 MED ORDER — OXYCODONE HCL 5 MG/5ML PO SOLN: 5.0000 mg | Freq: Once | ORAL | Status: DC | PRN

## 2019-03-30 MED ORDER — VITAMIN D3 25 MCG (1000 UNIT) PO TABS
2000.0000 [IU] | ORAL_TABLET | Freq: Every day | ORAL | Status: DC
  Administered 2019-03-31 – 2019-04-03 (×4): 2000 [IU] via ORAL
  Filled 2019-03-30 (×4): qty 2

## 2019-03-30 MED ORDER — PROCHLORPERAZINE MALEATE 10 MG PO TABS
10.0000 mg | ORAL_TABLET | Freq: Four times a day (QID) | ORAL | Status: DC | PRN
  Filled 2019-03-30: qty 1

## 2019-03-30 MED ORDER — SUGAMMADEX SODIUM 200 MG/2ML IV SOLN
INTRAVENOUS | Status: DC | PRN
  Administered 2019-03-30: 200 mg via INTRAVENOUS

## 2019-03-30 MED ORDER — MAGIC MOUTHWASH
15.0000 mL | Freq: Four times a day (QID) | ORAL | Status: DC | PRN
  Filled 2019-03-30: qty 15

## 2019-03-30 MED ORDER — LACTATED RINGERS IV BOLUS
1000.0000 mL | Freq: Once | INTRAVENOUS | Status: AC
  Administered 2019-03-30: 21:00:00 1000 mL via INTRAVENOUS

## 2019-03-30 MED ORDER — PHENYLEPHRINE 40 MCG/ML (10ML) SYRINGE FOR IV PUSH (FOR BLOOD PRESSURE SUPPORT)
PREFILLED_SYRINGE | INTRAVENOUS | Status: AC
  Filled 2019-03-30: qty 10

## 2019-03-30 MED ORDER — ROCURONIUM BROMIDE 10 MG/ML (PF) SYRINGE
PREFILLED_SYRINGE | INTRAVENOUS | Status: AC
  Filled 2019-03-30: qty 10

## 2019-03-30 MED ORDER — LORATADINE 10 MG PO TABS
10.0000 mg | ORAL_TABLET | Freq: Every day | ORAL | Status: DC
  Administered 2019-04-01 – 2019-04-03 (×3): 10 mg via ORAL
  Filled 2019-03-30 (×4): qty 1

## 2019-03-30 MED ORDER — BUPIVACAINE LIPOSOME 1.3 % IJ SUSP
20.0000 mL | Freq: Once | INTRAMUSCULAR | Status: AC
  Administered 2019-03-30: 13:00:00 20 mL
  Filled 2019-03-30: qty 20

## 2019-03-30 MED ORDER — ENOXAPARIN SODIUM 40 MG/0.4ML ~~LOC~~ SOLN
40.0000 mg | SUBCUTANEOUS | Status: DC
  Administered 2019-03-31 – 2019-04-03 (×4): 40 mg via SUBCUTANEOUS
  Filled 2019-03-30 (×4): qty 0.4

## 2019-03-30 MED ORDER — LIDOCAINE 2% (20 MG/ML) 5 ML SYRINGE
INTRAMUSCULAR | Status: DC | PRN
  Administered 2019-03-30: 60 mg via INTRAVENOUS

## 2019-03-30 MED ORDER — PHENYLEPHRINE HCL-NACL 10-0.9 MG/250ML-% IV SOLN: 0.0000 ug/min | INTRAVENOUS | Status: DC

## 2019-03-30 MED ORDER — LACTATED RINGERS IR SOLN
Status: DC | PRN
  Administered 2019-03-30: 1000 mL

## 2019-03-30 MED ORDER — LACTATED RINGERS IV SOLN: 1000.0000 mL | Freq: Three times a day (TID) | INTRAVENOUS | Status: AC | PRN

## 2019-03-30 MED ORDER — ROCURONIUM BROMIDE 10 MG/ML (PF) SYRINGE
PREFILLED_SYRINGE | INTRAVENOUS | Status: DC | PRN
  Administered 2019-03-30: 20 mg via INTRAVENOUS
  Administered 2019-03-30: 10 mg via INTRAVENOUS
  Administered 2019-03-30: 80 mg via INTRAVENOUS
  Administered 2019-03-30: 10 mg via INTRAVENOUS
  Administered 2019-03-30: 20 mg via INTRAVENOUS
  Administered 2019-03-30 (×2): 10 mg via INTRAVENOUS
  Administered 2019-03-30: 20 mg via INTRAVENOUS

## 2019-03-30 MED ORDER — ACETAMINOPHEN 500 MG PO TABS: 1000.0000 mg | ORAL_TABLET | Freq: Three times a day (TID) | ORAL | Status: DC

## 2019-03-30 MED ORDER — PHENYLEPHRINE HCL-NACL 10-0.9 MG/250ML-% IV SOLN
INTRAVENOUS | Status: DC | PRN
  Administered 2019-03-30: 30 ug/min via INTRAVENOUS

## 2019-03-30 MED ORDER — COLCHICINE 0.6 MG PO TABS
0.6000 mg | ORAL_TABLET | Freq: Two times a day (BID) | ORAL | Status: DC | PRN
  Filled 2019-03-30: qty 1

## 2019-03-30 MED ORDER — ALUM & MAG HYDROXIDE-SIMETH 200-200-20 MG/5ML PO SUSP: 30.0000 mL | Freq: Four times a day (QID) | ORAL | Status: DC | PRN

## 2019-03-30 MED ORDER — GABAPENTIN 250 MG/5ML PO SOLN
300.0000 mg | Freq: Two times a day (BID) | ORAL | Status: DC
  Administered 2019-03-30: 22:00:00 300 mg via ORAL
  Filled 2019-03-30 (×2): qty 6

## 2019-03-30 MED ORDER — ALBUMIN HUMAN 5 % IV SOLN
12.5000 g | Freq: Four times a day (QID) | INTRAVENOUS | Status: AC | PRN
  Filled 2019-03-30: qty 250

## 2019-03-30 MED ORDER — MIDAZOLAM HCL 2 MG/2ML IJ SOLN
INTRAMUSCULAR | Status: AC
  Filled 2019-03-30: qty 2

## 2019-03-30 MED ORDER — ONDANSETRON HCL 4 MG/2ML IJ SOLN
INTRAMUSCULAR | Status: DC | PRN
  Administered 2019-03-30: 4 mg via INTRAVENOUS

## 2019-03-30 MED ORDER — ACETAMINOPHEN 325 MG PO TABS: 325.0000 mg | ORAL_TABLET | ORAL | Status: DC | PRN

## 2019-03-30 MED ORDER — DEXAMETHASONE SODIUM PHOSPHATE 4 MG/ML IJ SOLN
INTRAMUSCULAR | Status: DC | PRN
  Administered 2019-03-30: 5 mg via INTRAVENOUS

## 2019-03-30 MED ORDER — METOPROLOL TARTRATE 5 MG/5ML IV SOLN: 5.0000 mg | Freq: Four times a day (QID) | INTRAVENOUS | Status: DC | PRN

## 2019-03-30 MED ORDER — SODIUM CHLORIDE 0.9 % IV SOLN
2.0000 g | INTRAVENOUS | Status: AC
  Administered 2019-03-30: 12:00:00 2 g via INTRAVENOUS
  Filled 2019-03-30: qty 20

## 2019-03-30 MED ORDER — PHENYLEPHRINE HCL-NACL 10-0.9 MG/250ML-% IV SOLN: 25.0000 ug/min | INTRAVENOUS | Status: DC

## 2019-03-30 MED ORDER — ONDANSETRON HCL 4 MG/2ML IJ SOLN: 4.0000 mg | Freq: Once | INTRAMUSCULAR | Status: DC | PRN

## 2019-03-30 MED ORDER — EPHEDRINE SULFATE-NACL 50-0.9 MG/10ML-% IV SOSY
PREFILLED_SYRINGE | INTRAVENOUS | Status: DC | PRN
  Administered 2019-03-30: 10 mg via INTRAVENOUS
  Administered 2019-03-30 (×4): 5 mg via INTRAVENOUS
  Administered 2019-03-30 (×2): 10 mg via INTRAVENOUS
  Administered 2019-03-30: 5 mg via INTRAVENOUS
  Administered 2019-03-30 (×2): 10 mg via INTRAVENOUS

## 2019-03-30 MED ORDER — SODIUM CHLORIDE 0.9% FLUSH
3.0000 mL | Freq: Two times a day (BID) | INTRAVENOUS | Status: DC
  Administered 2019-03-30 – 2019-04-02 (×4): 3 mL via INTRAVENOUS

## 2019-03-30 MED ORDER — POLYETHYLENE GLYCOL 3350 17 G PO PACK
17.0000 g | PACK | Freq: Two times a day (BID) | ORAL | Status: DC
  Administered 2019-03-30 – 2019-04-03 (×6): 17 g via ORAL
  Filled 2019-03-30 (×7): qty 1

## 2019-03-30 MED ORDER — PROPOFOL 10 MG/ML IV BOLUS
INTRAVENOUS | Status: AC
  Filled 2019-03-30: qty 20

## 2019-03-30 MED ORDER — ACETAMINOPHEN 160 MG/5ML PO SOLN: 325.0000 mg | ORAL | Status: DC | PRN

## 2019-03-30 MED ORDER — LACTATED RINGERS IV SOLN
INTRAVENOUS | Status: DC
  Administered 2019-03-30: 11:00:00 via INTRAVENOUS

## 2019-03-30 MED ORDER — PHENYLEPHRINE HCL-NACL 10-0.9 MG/250ML-% IV SOLN
INTRAVENOUS | Status: AC
  Filled 2019-03-30: qty 250

## 2019-03-30 MED ORDER — GABAPENTIN 300 MG PO CAPS
300.0000 mg | ORAL_CAPSULE | ORAL | Status: AC
  Administered 2019-03-30: 11:00:00 300 mg via ORAL
  Filled 2019-03-30: qty 1

## 2019-03-30 MED ORDER — SODIUM CHLORIDE 0.9 % IV SOLN
500.0000 mg | Freq: Once | INTRAVENOUS | Status: AC
  Administered 2019-03-31: 500 mg via INTRAVENOUS
  Filled 2019-03-30: qty 10

## 2019-03-30 MED ORDER — OXYCODONE HCL 5 MG PO TABS
5.0000 mg | ORAL_TABLET | ORAL | Status: DC | PRN
  Administered 2019-03-30 – 2019-04-01 (×5): 10 mg via ORAL
  Filled 2019-03-30 (×6): qty 2

## 2019-03-30 MED ORDER — ASPIRIN 81 MG PO CHEW
81.0000 mg | CHEWABLE_TABLET | Freq: Every day | ORAL | Status: DC
  Administered 2019-03-31 – 2019-04-03 (×4): 81 mg via ORAL
  Filled 2019-03-30 (×4): qty 1

## 2019-03-30 MED ORDER — DIPHENHYDRAMINE HCL 50 MG/ML IJ SOLN: 12.5000 mg | Freq: Four times a day (QID) | INTRAMUSCULAR | Status: DC | PRN

## 2019-03-30 MED ORDER — PROCHLORPERAZINE EDISYLATE 10 MG/2ML IJ SOLN: 5.0000 mg | Freq: Four times a day (QID) | INTRAMUSCULAR | Status: DC | PRN

## 2019-03-30 MED ORDER — SIMVASTATIN 20 MG PO TABS
20.0000 mg | ORAL_TABLET | Freq: Every day | ORAL | Status: DC
  Administered 2019-03-31 – 2019-04-02 (×3): 20 mg via ORAL
  Filled 2019-03-30 (×2): qty 1
  Filled 2019-03-30: qty 2

## 2019-03-30 MED ORDER — METOCLOPRAMIDE HCL 5 MG/ML IJ SOLN: 5.0000 mg | Freq: Three times a day (TID) | INTRAMUSCULAR | Status: DC | PRN

## 2019-03-30 MED ORDER — DIPHENHYDRAMINE HCL 12.5 MG/5ML PO ELIX
12.5000 mg | ORAL_SOLUTION | Freq: Four times a day (QID) | ORAL | Status: DC | PRN
  Filled 2019-03-30: qty 5

## 2019-03-30 MED ORDER — ENSURE SURGERY PO LIQD
237.0000 mL | Freq: Two times a day (BID) | ORAL | Status: DC
  Administered 2019-03-31 – 2019-04-03 (×7): 237 mL via ORAL
  Filled 2019-03-30 (×8): qty 237

## 2019-03-30 MED ORDER — METHOCARBAMOL 500 MG PO TABS: 750.0000 mg | ORAL_TABLET | Freq: Four times a day (QID) | ORAL | Status: DC | PRN

## 2019-03-30 MED ORDER — NITROGLYCERIN 0.4 MG SL SUBL: 0.4000 mg | SUBLINGUAL_TABLET | SUBLINGUAL | Status: DC | PRN

## 2019-03-30 MED ORDER — GUAIFENESIN-DM 100-10 MG/5ML PO SYRP: 10.0000 mL | ORAL_SOLUTION | ORAL | Status: DC | PRN

## 2019-03-30 MED ORDER — LIDOCAINE HCL 2 % IJ SOLN
INTRAMUSCULAR | Status: AC
  Filled 2019-03-30: qty 20

## 2019-03-30 MED ORDER — MEPERIDINE HCL 50 MG/ML IJ SOLN: 6.2500 mg | INTRAMUSCULAR | Status: DC | PRN

## 2019-03-30 MED ORDER — AMLODIPINE BESYLATE 5 MG PO TABS
5.0000 mg | ORAL_TABLET | Freq: Every day | ORAL | Status: DC
  Filled 2019-03-30: qty 1

## 2019-03-30 MED ORDER — ALBUMIN HUMAN 5 % IV SOLN
25.0000 g | Freq: Once | INTRAVENOUS | Status: AC
  Administered 2019-03-30: 18:00:00 25 g via INTRAVENOUS

## 2019-03-30 MED ORDER — OXYCODONE HCL 5 MG PO TABS: 5.0000 mg | ORAL_TABLET | Freq: Once | ORAL | Status: DC | PRN

## 2019-03-30 SURGICAL SUPPLY — 63 items
APL PRP STRL LF DISP 70% ISPRP (MISCELLANEOUS) ×1
APPLIER CLIP 5 13 M/L LIGAMAX5 (MISCELLANEOUS)
APR CLP MED LRG 5 ANG JAW (MISCELLANEOUS)
BLADE SURG SZ11 CARB STEEL (BLADE) ×2 IMPLANT
CHLORAPREP W/TINT 26 (MISCELLANEOUS) ×2 IMPLANT
CLIP APPLIE 5 13 M/L LIGAMAX5 (MISCELLANEOUS) IMPLANT
COVER SURGICAL LIGHT HANDLE (MISCELLANEOUS) ×2 IMPLANT
COVER TIP SHEARS 8 DVNC (MISCELLANEOUS) IMPLANT
COVER TIP SHEARS 8MM DA VINCI (MISCELLANEOUS) ×1
COVER WAND RF STERILE (DRAPES) ×2 IMPLANT
DECANTER SPIKE VIAL GLASS SM (MISCELLANEOUS) ×2 IMPLANT
DRAIN CHANNEL 19F RND (DRAIN) ×1 IMPLANT
DRAIN PENROSE 18X1/2 LTX STRL (DRAIN) IMPLANT
DRAPE ARM DVNC X/XI (DISPOSABLE) ×4 IMPLANT
DRAPE COLUMN DVNC XI (DISPOSABLE) ×1 IMPLANT
DRAPE DA VINCI XI ARM (DISPOSABLE) ×4
DRAPE DA VINCI XI COLUMN (DISPOSABLE) ×1
DRAPE WARM FLUID 44X44 (DRAPES) ×2 IMPLANT
DRSG TEGADERM 2-3/8X2-3/4 SM (GAUZE/BANDAGES/DRESSINGS) ×10 IMPLANT
DRSG TEGADERM 4X4.75 (GAUZE/BANDAGES/DRESSINGS) ×1 IMPLANT
ELECT REM PT RETURN 15FT ADLT (MISCELLANEOUS) ×2 IMPLANT
ENDOLOOP SUT PDS II  0 18 (SUTURE)
ENDOLOOP SUT PDS II 0 18 (SUTURE) IMPLANT
EVACUATOR DRAINAGE 10X20 100CC (DRAIN) IMPLANT
EVACUATOR SILICONE 100CC (DRAIN) ×2 IMPLANT
FELT TEFLON 4 X1 (Mesh General) ×2 IMPLANT
GAUZE SPONGE 2X2 8PLY STRL LF (GAUZE/BANDAGES/DRESSINGS) ×1 IMPLANT
GLOVE ECLIPSE 8.0 STRL XLNG CF (GLOVE) ×4 IMPLANT
GLOVE INDICATOR 8.0 STRL GRN (GLOVE) ×4 IMPLANT
GOWN STRL REUS W/TWL XL LVL3 (GOWN DISPOSABLE) ×6 IMPLANT
GRASPER SUT TROCAR 14GX15 (MISCELLANEOUS) IMPLANT
IRRIG SUCT STRYKERFLOW 2 WTIP (MISCELLANEOUS) ×2
IRRIGATION SUCT STRKRFLW 2 WTP (MISCELLANEOUS) ×1 IMPLANT
KIT BASIN OR (CUSTOM PROCEDURE TRAY) ×2 IMPLANT
KIT TURNOVER KIT A (KITS) IMPLANT
MESH PHASIX RESORB RECT 10X15 (Mesh General) ×1 IMPLANT
NEEDLE HYPO 22GX1.5 SAFETY (NEEDLE) ×2 IMPLANT
PACK CARDIOVASCULAR III (CUSTOM PROCEDURE TRAY) ×2 IMPLANT
PAD POSITIONING PINK XL (MISCELLANEOUS) ×2 IMPLANT
PENCIL SMOKE EVACUATOR (MISCELLANEOUS) IMPLANT
SCISSORS LAP 5X45 EPIX DISP (ENDOMECHANICALS) IMPLANT
SEAL CANN UNIV 5-8 DVNC XI (MISCELLANEOUS) ×4 IMPLANT
SEAL XI 5MM-8MM UNIVERSAL (MISCELLANEOUS) ×4
SEALER VESSEL DA VINCI XI (MISCELLANEOUS) ×1
SEALER VESSEL EXT DVNC XI (MISCELLANEOUS) ×1 IMPLANT
SET TUBE SMOKE EVAC HIGH FLOW (TUBING) ×2 IMPLANT
SOLUTION ELECTROLUBE (MISCELLANEOUS) ×2 IMPLANT
SPONGE GAUZE 2X2 STER 10/PKG (GAUZE/BANDAGES/DRESSINGS) ×1
SPONGE LAP 18X18 RF (DISPOSABLE) ×2 IMPLANT
STOPCOCK 4 WAY LG BORE MALE ST (IV SETS) ×4 IMPLANT
SUT ETHIBOND 0 36 GRN (SUTURE) ×4 IMPLANT
SUT ETHIBOND NAB CT1 #1 30IN (SUTURE) ×7 IMPLANT
SUT MNCRL AB 4-0 PS2 18 (SUTURE) ×2 IMPLANT
SUT PROLENE 2 0 SH DA (SUTURE) ×1 IMPLANT
SUT VICRYL 0 TIES 12 18 (SUTURE) IMPLANT
SUT VICRYL 0 UR6 27IN ABS (SUTURE) ×1 IMPLANT
SUT VLOC 180 2-0 9IN GS21 (SUTURE) ×1 IMPLANT
SYR 10ML LL (SYRINGE) ×2 IMPLANT
SYR 20ML LL LF (SYRINGE) ×2 IMPLANT
TOWEL OR 17X26 10 PK STRL BLUE (TOWEL DISPOSABLE) ×2 IMPLANT
TOWEL OR NON WOVEN STRL DISP B (DISPOSABLE) ×2 IMPLANT
TRAY CATH 16FR W/PLASTIC CATH (SET/KITS/TRAYS/PACK) ×1 IMPLANT
TROCAR ADV FIXATION 5X100MM (TROCAR) ×2 IMPLANT

## 2019-03-30 NOTE — Transfer of Care (Signed)
Immediate Anesthesia Transfer of Care Note  Patient: Douglas [name]  Procedure(s) Performed: ROBOTIC REPAIR OF PARAESOPHAGEAL HIATAL HERNIA WITH FUNDOPLICATION WITH MESH (N/A Abdomen)  Patient Location: PACU  Anesthesia Type:General  Level of Consciousness: awake, sedated and patient cooperative  Airway & Oxygen Therapy: Patient Spontanous Breathing and Patient connected to face mask oxygen  Post-op Assessment: Report given to RN, Post -op Vital signs reviewed and stable and Patient moving all extremities X 4  Post vital signs:unstable  Last Vitals:  Vitals Value Taken Time  BP 110/64 03/30/19 1839  Temp 36.9 C 03/30/19 1747  Pulse 84 03/30/19 1839  Resp 22 03/30/19 1839  SpO2 96 % 03/30/19 1839  Vitals shown include unvalidated device data.  Last Pain:  Vitals:   03/30/19 1747  TempSrc:   PainSc: Asleep         Complications: No apparent anesthesia complications

## 2019-03-30 NOTE — Anesthesia Postprocedure Evaluation (Signed)
Anesthesia Post Note  Patient: Douglas [name]  Procedure(s) Performed: ROBOTIC REPAIR OF PARAESOPHAGEAL HIATAL HERNIA WITH FUNDOPLICATION WITH MESH (N/A Abdomen)     Patient location during evaluation: PACU Anesthesia Type: General Level of consciousness: awake and alert Pain management: pain level controlled Vital Signs Assessment: post-procedure vital signs reviewed and stable Respiratory status: spontaneous breathing, nonlabored ventilation, respiratory function stable and patient connected to nasal cannula oxygen Cardiovascular status: blood pressure returned to baseline and stable Postop Assessment: no apparent nausea or vomiting Anesthetic complications: no Comments: On initial arrival to PACU pt hypotensive with HR 110's.  Treated with phenylephrine and albumin with good response.  Vitals to baseline within 10 minutes with sats 93-98%.  Pain well controlled.  ej Jacquline Terrill jrmd    Last Vitals:  Vitals:   03/30/19 1842 03/30/19 1845  BP: 107/78 108/69  Pulse: 81 81  Resp: (!) 22 (!) 23  Temp:    SpO2: 93% 96%    Last Pain:  Vitals:   03/30/19 1845  TempSrc:   PainSc: 3                  Densel Kronick

## 2019-03-30 NOTE — Op Note (Signed)
03/30/2019  5:35 PM  PATIENT:  Douglas [name]  73 y.o. 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 ROBOTIC reduction of paraesophageal hiatal hernia 2. Type II mediastinal dissection. 3. RIGHT crural fascial relaxation  3. Primary repair of hiatal hernia over pledgets.  4. Anterior & posterior gastropexy. 5. Toupet (Partial posterior A999333 degree) fundoplication x 3 cm 6. Mesh reinforcement with absorbable Phasix mesh  SURGEON:  Adin Hector, MD  ASSIST:  Leighton Ruff, MD, FACS. An experienced assistant was required given the standard of surgical care given the complexity of the case.  This assistant was needed for exposure, dissection, suctioning, retraction, instrument exchange, etc.  ANESTHESIA:   local and general   Nerve block provided with liposomal bupivacaine (Experel) mixed with 0.25% bupivacaine as a Bilateral TAP block x 83mL each side at the level of the transverse abdominis & preperitoneal spaces along the flank at the anterior axillary line, from subxiphoid & subcostal ridge to iliac crest under laparoscopic guidance    EBL:  Total I/O In: 2200 [I.V.:2000; IV Piggyback:200] Out: 250 [Blood:250]  Delay start of Pharmacological VTE agent (>24hrs) due to surgical blood loss or risk of bleeding:  no  ANESTHESIA: 1. General anesthesia. 2. Local anesthetic in a field block around all port sites.  SPECIMEN:  Mediastinal hernia sac (not  sent).  DRAINS:  A 19-French Blake drain goes from the right upper quadrant along the lesser curvature of the stomach into the mediastinum.  COUNTS:  YES  PLAN OF CARE: Admit for overnight observation  PATIENT DISPOSITION:  PACU - hemodynamically stable.  INDICATION:   Patient with symptomatic paraesophageal hiatal hernia.  The patient has had extensive work-up & we feel the patient will benefit from repair:  The anatomy & physiology of the foregut and anti-reflux mechanism was discussed.  The pathophysiology of hiatal herniation and GERD was discussed.  Natural history risks without surgery was discussed.   The patient's symptoms are not adequately controlled by medicines and other non-operative treatments.  I feel the risks of no intervention will lead to serious problems that outweigh the operative risks; therefore, I recommended surgery to reduce the hiatal hernia out of the chest and fundoplication to rebuild the anti-reflux valve and control reflux better.  Need for a thorough workup to rule out the differential diagnosis and plan treatment was explained.  I explained laparoscopic techniques with possible need for an open approach.  Risks such as bleeding, infection, abscess, leak, need for further treatment, heart attack, death, and other risks were discussed.   I noted a good likelihood this will help address the problem.  Goals of post-operative recovery were discussed as well.  Possibility that this will not correct all symptoms was explained.  Post-operative dysphagia, need for short-term liquid & pureed diet, inability to vomit, possibility of reherniation, possible need for medicines to help control symptoms in addition to surgery were discussed.  We will work to minimize complications.   Educational handouts further explaining the pathology, treatment options, and dysphagia diet was given as well.  Questions were answered.  The patient  expresses understanding & wishes to proceed with  surgery.  OR FINDINGS:   Large paraesophageal hiatal hernia with 100% of the stomach in the mediastinum.  There was a 10 x 10 cm hiatal defect.  It is a primary repair over pledgets.  Right crural fascial release to allow primary closure.  Mesh reinforcement was used with Mesh was used: Phasix Mesh (a knitted monofilament mesh scaffold using Poly-4-hydroxybutyrate (P4HB), a biologically derived, fully resorbable material)  The patient has a 3 cm Toupet (partial posterior A999333 degree) fundoplication.  The patient has had bilateral anteriolateral and posterior midline gastropexies.  DESCRIPTION:   Informed consent was confirmed.  The patient received IV antibiotics prior to incision.  The underwent general anesthesia without difficulty.  A Foley catheter sterilely placed.  The patient was positioned in split leg with arms tucked. The abdomen was prepped and draped in the sterile fashion.  Surgical time-out confirmed our plan.  I placed a 5 mm port in the left subcostal region using Varess entry technique with the patient in steep reverse Trendelenburg and left side up.  Entry was clean.  We induced carbon dioxide insufflation.  Camera inspection revealed no injury.  Under direct visualization, I placed extra ports.  Freed some wispy omental adhesions in the supraumbilical region along the edge of the prior midline Bard mesh.  I also placed a 5 mm port in the left subxiphoid region under direct visualization.  I removed that and placed an Omega-shaped rigid Nathanson liver retractor to lift the left lateral sector of the liver anteriorly to expose the esophageal hiatus.  This was secured to the bed using the iron man system.  The Xi robot was carefully docked and instruments placed and advanced under direct visualization.  We focused on dissection.  Patient had a large enterotomy defect with all of the stomach and duodenal bulb and moderate volume of omentum up into the chest.  We grasped the anterior  mediastinal sac at the apex of the crus.  I scored through that and got into the anterior mediastinum.  I was able to free the mediastinal sac from its attachments to the pericardium and bilateral pleura using primarily focused gentle blunt dissection as well as focused vessel sealer dissection.  This took some time.  He actually had a large volume of stomach and omentum in his mediastinum especially posteriorly.  I transected phrenoesophageal attachments to the inner right crus, preserving a two centimeter cuff of mediastinal sac until I found the base of the crura.  I then came around anteriorly on the left side and freed up the phrenoesophageal attachments of the mediastinal sac on the medial part of the left crus on the superior half.  We ligated the short gastrics along the lesser curvature of the stomach about a halfway down and then came up proximally over the fundus.  We released the attachments of the stomach to the retroperitoneum until we were able to connect with the prior dissection on the left crus.  I did careful mediastinal dissection to free the mediastinal sac.  With that, we could relieve the suction cup affect of the hernia sac and help reduce the stomach back down into the abdomen, flipped back approriately.  I ended up having to excise large swaths of thickened and fatty anterior posterior hernia sacs to better expose and see the esophagogastric junction.  Partial omentectomy as well.  With that we can have better visualization to better see the crura, especially posteriorly.  We completed the  release of phrenoesophageal attachments to the medial part of the left crus down to its base.  With this, we had circumferential mobilization.    We placed the stomach and esophagus on axial tension.  I then did a Type II mediastinal dissection where I freed the esophagus from its attachments to the aorta, spine, pleura, and pericardium using primarily gentle blunt as well as focused ultrasonic  dissection.  We saw the left/anterior & right/posterior vagus nerves intact.  We preserved it at all times.  I procedded to dissect about 30 cm proximally into the mediastinum.  With that I could straighten out the esophagus and get 5 cm of intra-abdominal length of the esophagus at a best estimation.  However the mid stomach still wanted to go up into the chest.  Eventually had to focus and free retroperitoneal attachments off the left crus and retroperitoneum.  Became apparent that the pancreatic body was going up into the mediastinum as well.  We carefully mobilized that down and freed that off.  Freddrick March off some retroperitoneal attachments.  That released tension and allowed the stomach to rest into the intra-abdominal space off tension.  I freed the anterior mediastinal sac off the esophagus & stomach.  We saw the anterior vagus nerve and freed the sac off of the vagus.  Freed large bulky fatty posterior mediastinal hernia sacs along the right posterior vagus nerve as well.  I dissected out & removed the fatty epiphrenic pads at the esophagogastric junction. With that, I could better define the esophagogastric junction.  I confirmed the the patient had 5 cm of intra-abdominal esophageal length off tension.  We irrigated into the mediastinum.  There is some old blood but we washed away and aspirated and confirmed hemostasis in the mediastinal cavity.  I brought the fundus of the stomach posterior to the esophagus over to the right side.  The wrap was mobile with the classic shoe shine maneuver.  Wrap became together gently.  We reflected the stomach left laterally and closed the esophageal hiatus using 0 Ethibond stitch using horizontal mattress stitches with pledgets on both sides.  I did that x2 stitches.  However the right crus would not come over very well.  Therefore I transected the fascia radially about 3 cm lateral to the right inner crus about within a centimeter of the inferior vena cava.  And  transecting across the phrenic vein as well.  That gave me another 3 to 4 cm of crural release and allow it come together under much less tension.  I placed another horizontal mattress pledgeted suture.  I then placed a fourth nonpledgeted horizontal mattress suture as the most anterior interrupted suture allowing the hiatus to be loosely snug around the esophagus. The crura had good substance and they came together well without any tension.  Because of the larger defect requiring a right coronal release in his obesity, I reinforced the repair using a 15 x 10 cm biologic Phasix mesh.  I cut out a 2x4 cm part of mesh in the middle third of the mesh such that the mesh had a broad U shape transversely, one tail 5 cm wide & the other 8cm.  We brought the mesh in and laid it over the crural repair, tails anterior over the crura.  I tucked the broader "U" tail of the mesh between the left diaphragm and the spleen, the narrower "U" tail over the right crus.  I then secured the posterior & anterior corners of the narrower"U"  tail with 0 Ethibond upper interrupted suture to the right crus.    I did to make sure that covered the releasing area and had good stitching lateral to intact fascia closer to the IVC side.  I secured to the left lateral and left superior sides of the broader "U" tail to the left diaphragm band with 2-0 V lock running suture.   I brought the fundus of the stomach behind the esophagus and cardia to set up a fundoplication wrap.  I did a posterior gastropexy by taking of #1 Ethibond stitches to the posterior part of the right side of the wrap and thru the Phasix mesh and crural closure.  I did 3 interrupted sutures for posterior midline gastropexy  I placed a interrupted Ethilon sutures through the most superior part of the esophagus, crura, Phasix mesh, right superior part of the wrap.  Tied that down for a right anterior lateral gastropexy, covering the Phasix mesh away from the esophagus.  Similar  stitch on the left anterior side as well.  That way the stomach covered the mesh and protected it from any esophageal exposure.  With the anterior and posterior gastropexy's, stomach laid well for a fundoplication wrap.  I did copious irrigation with good return.  Hemostasis was good.  He has a significant edema and epiphrenic fat around esophagogastric junction, I decided to do a partial fundoplication.  I did a posterior 270 degree Toupet fundoplication using 3 pairs of sutures, taking a bite of the medial corner of the gastric wrap and lateral esophagus on right and left sides.  Measured 3 cm in length.  The wrap was soft and floppy.  The crural closure was snug but easily allowed a large robotic blunt grasper to pass into the mediastinum anteriorly.  I placed a drain as noted above.  I did irrigation and ensured hemostasis.  I saw no evidence of any leak or perforation or other abnormality.  I removed the [facility] liver retractor under direct visualization.  I evacuated carbon dioxide and removed the ports.  The skin was closed with Monocryl and sterile dressings applied.  She was extubated.  He did have some oozing around his drain site that it secured.  We placed with a clean dressing.  No active bleeding at the skin.  He was a little bit confused in the operating room but then perked up.  Good blood pressure.  Sats 96%.  Transferred to the PACU recovery room.  I discussed postop care in detail with the patient and family in in the office.  Discussed again with the patient in the holding area.  I discussed operative findings, updated the patient's status, discussed probable steps to recovery, and gave postoperative recommendations to the patient's spouse.  Recommendations were made.  Questions were answered.  She expressed understanding & appreciation.  Because of prolonged case with increased EBL & soft BP in PACU, will watch in stepdown unit at least overnight.  Adin Hector, M.D.,  F.A.C.S. Gastrointestinal and Minimally Invasive Surgery [facility] [address], [place], [place] [address] [phone] Main / Paging

## 2019-03-30 NOTE — Discharge Instructions (Signed)
EATING AFTER YOUR ESOPHAGEAL SURGERY (Stomach Fundoplication, Hiatal Hernia repair, Achalasia surgery, etc)  ######################################################################  EAT Start with a pureed / full liquid diet (see below) Gradually transition to a high fiber diet with a fiber supplement over the next month after discharge.    WALK Walk an hour a day.  Control your pain to do that.    CONTROL PAIN Control pain so that you can walk, sleep, tolerate sneezing/coughing, go up/down stairs.  HAVE A BOWEL MOVEMENT DAILY Keep your bowels regular to avoid problems.  OK to try a laxative to override constipation.  OK to use an antidairrheal to slow down diarrhea.  Call if not better after 2 tries  CALL IF YOU HAVE PROBLEMS/CONCERNS Call if you are still struggling despite following these instructions. Call if you have concerns not answered by these instructions  ######################################################################   After your esophageal surgery, expect some sticking with swallowing over the next 1-2 months.    If food sticks when you eat, it is called "dysphagia".  This is due to swelling around your esophagus at the wrap & hiatal diaphragm repair.  It will gradually ease off over the next few months.  To help you through this temporary phase, we start you out on a pureed (blenderized) diet.  Your first meal in the hospital was thin liquids.  You should have been given a pureed diet by the time you left the hospital.  We ask patients to stay on a pureed diet for the first 2-3 weeks to avoid anything getting "stuck" near your recent surgery.  Don't be alarmed if your ability to swallow doesn't progress according to this plan.  Everyone is different and some diets can advance more or less quickly.    It is often helpful to crush your medications or split them as they can sometimes stick, especially the first week or so.   Some BASIC RULES to follow  are:  Maintain an upright position whenever eating or drinking.  Take small bites - just a teaspoon size bite at a time.  Eat slowly.  It may also help to eat only one food at a time.  Consider nibbling through smaller, more frequent meals & avoid the urge to eat BIG meals  Do not push through feelings of fullness, nausea, or bloatedness  Do not mix solid foods and liquids in the same mouthful  Try not to "wash foods down" with large gulps of liquids.  Avoid carbonated (bubbly/fizzy) drinks.    Avoid foods that make you feel gassy or bloated.  Start with bland foods first.  Wait on trying greasy, fried, or spicy meals until you are tolerating more bland solids well.  Understand that it will be hard to burp and belch at first.  This gradually improves with time.  Expect to be more gassy/flatulent/bloated initially.  Walking will help your body manage it better.  Consider using medications for bloating that contain simethicone such as  Maalox or Gas-X   Consider crushing her medications, especially smaller pills.  The ability to swallow pills should get easier after a few weeks  Eat in a relaxed atmosphere & minimize distractions.  Avoid talking while eating.    Do not use straws.  Following each meal, sit in an upright position (90 degree angle) for 60 to 90 minutes.  Going for a short walk can help as well  If food does stick, don't panic.  Try to relax and let the food pass on its own.    Sipping WARM LIQUID such as strong hot black tea can also help slide it down.   Be gradual in changes & use common sense:  -If you easily tolerating a certain "level" of foods, advance to the next level gradually -If you are having trouble swallowing a particular food, then avoid it.   -If food is sticking when you advance your diet, go back to thinner previous diet (the lower LEVEL) for 1-2 days.  LEVEL 1 = PUREED DIET  Do for the first 2 WEEKS AFTER SURGERY  -Foods in this group are  pureed or blenderized to a smooth, mashed potato-like consistency.  -If necessary, the pureed foods can keep their shape with the addition of a thickening agent.   -Meat should be pureed to a smooth, pasty consistency.  Hot broth or gravy may be added to the pureed meat, approximately 1 oz. of liquid per 3 oz. serving of meat. -CAUTION:  If any foods do not puree into a smooth consistency, swallowing will be more difficult.  (For example, nuts or seeds sometimes do not blend well.)  Hot Foods Cold Foods  Pureed scrambled eggs and cheese Pureed cottage cheese  Baby cereals Thickened juices and nectars  Thinned cooked cereals (no lumps) Thickened milk or eggnog  Pureed [place] toast or pancakes Ensure  Mashed potatoes Ice cream  Pureed parsley, au gratin, scalloped potatoes, candied sweet potatoes Fruit or [place] ice, sherbet  Pureed buttered or alfredo noodles Plain yogurt  Pureed vegetables (no corn or peas) Instant breakfast  Pureed soups and creamed soups Smooth pudding, mousse, custard  Pureed scalloped apples Whipped gelatin  Gravies Sugar, syrup, honey, jelly  Sauces, cheese, tomato, barbecue, white, creamed Cream  Any baby food Creamer  Alcohol in moderation (not beer or champagne) Margarine  Coffee or tea Mayonnaise   Ketchup, mustard   Apple sauce   SAMPLE MENU:  PUREED DIET Breakfast Lunch Dinner   Orange juice, 1/2 cup  Cream of wheat, 1/2 cup  Pineapple juice, 1/2 cup  Pureed [place], barley soup, 3/4 cup  Pureed Hawaiian chicken, 3 oz   Scrambled eggs, mashed or blended with cheese, 1/2 cup  Tea or coffee, 1 cup   Whole milk, 1 cup   Non-dairy creamer, 2 Tbsp.  Mashed potatoes, 1/2 cup  Pureed cooled broccoli, 1/2 cup  Apple sauce, 1/2 cup  Coffee or tea  Mashed potatoes, 1/2 cup  Pureed spinach, 1/2 cup  Frozen yogurt, 1/2 cup  Tea or coffee      LEVEL 2 = SOFT DIET  After your first 2 weeks, you can advance to a soft diet.   Keep on this  diet until everything goes down easily.  Hot Foods Cold Foods  White fish Cottage cheese  Stuffed fish Junior baby fruit  Baby food meals Semi thickened juices  Minced soft cooked, scrambled, poached eggs nectars  Souffle & omelets Ripe mashed bananas  Cooked cereals Canned fruit, pineapple sauce, milk  potatoes Milkshake  Buttered or Alfredo noodles Custard  Cooked cooled vegetable Puddings, including tapioca  Sherbet Yogurt  Vegetable soup or alphabet soup Fruit ice, [place] ice  Gravies Whipped gelatin  Sugar, syrup, honey, jelly Junior baby desserts  Sauces:  Cheese, creamed, barbecue, tomato, white Cream  Coffee or tea Margarine   SAMPLE MENU:  LEVEL 2 Breakfast Lunch Dinner   Orange juice, 1/2 cup  Oatmeal, 1/2 cup  Scrambled eggs with cheese, 1/2 cup  Decaffeinated tea, 1 cup  Whole milk, 1 cup  Non-dairy creamer, 2 Tbsp  Pineapple juice, 1/2 cup  Minced beef, 3 oz  Gravy, 2 Tbsp  Mashed potatoes, 1/2 cup  Minced fresh broccoli, 1/2 cup  Applesauce, 1/2 cup  Coffee, 1 cup  [place], barley soup, 3/4 cup  Minced Hawaiian chicken, 3 oz  Mashed potatoes, 1/2 cup  Cooked spinach, 1/2 cup  Frozen yogurt, 1/2 cup  Non-dairy creamer, 2 Tbsp      LEVEL 3 = CHOPPED DIET  -After all the foods in level 2 (soft diet) are passing through well you should advance up to more chopped foods.  -It is still important to cut these foods into small pieces and eat slowly.  Hot Foods Cold Foods  Poultry Cottage cheese  Chopped Swedish meatballs Yogurt  Meat salads (ground or flaked meat) Milk  Flaked fish (tuna) Milkshakes  Poached or scrambled eggs Soft, cold, dry cereal  Souffles and omelets Fruit juices or nectars  Cooked cereals Chopped canned fruit  Chopped French toast or pancakes Canned fruit cocktail  Noodles or pasta (no rice) Pudding, mousse, custard  Cooked vegetables (no frozen peas, corn, or mixed vegetables) Green salad  Canned small sweet peas  Ice cream  Creamed soup or vegetable soup Fruit ice, Italian ice  Pureed vegetable soup or alphabet soup Non-dairy creamer  Ground scalloped apples Margarine  Gravies Mayonnaise  Sauces:  Cheese, creamed, barbecue, tomato, white Ketchup  Coffee or tea Mustard   SAMPLE MENU:  LEVEL 3 Breakfast Lunch Dinner   Orange juice, 1/2 cup  Oatmeal, 1/2 cup  Scrambled eggs with cheese, 1/2 cup  Decaffeinated tea, 1 cup  Whole milk, 1 cup  Non-dairy creamer, 2 Tbsp  Ketchup, 1 Tbsp  Margarine, 1 tsp  Salt, 1/4 tsp  Sugar, 2 tsp  Pineapple juice, 1/2 cup  Ground beef, 3 oz  Gravy, 2 Tbsp  Mashed potatoes, 1/2 cup  Cooked spinach, 1/2 cup  Applesauce, 1/2 cup  Decaffeinated coffee  Whole milk  Non-dairy creamer, 2 Tbsp  Margarine, 1 tsp  Salt, 1/4 tsp  Pureed turkey, barley soup, 3/4 cup  Barbecue chicken, 3 oz  Mashed potatoes, 1/2 cup  Ground fresh broccoli, 1/2 cup  Frozen yogurt, 1/2 cup  Decaffeinated tea, 1 cup  Non-dairy creamer, 2 Tbsp  Margarine, 1 tsp  Salt, 1/4 tsp  Sugar, 1 tsp    LEVEL 4:  REGULAR FOODS  -Foods in this group are soft, moist, regularly textured foods.   -This level includes meat and breads, which tend to be the hardest things to swallow.   -Eat very slowly, chew well and continue to avoid carbonated drinks. -most people are at this level in 4-6 weeks  Hot Foods Cold Foods  Baked fish or skinned Soft cheeses - cottage cheese  Souffles and omelets Cream cheese  Eggs Yogurt  Stuffed shells Milk  Spaghetti with meat sauce Milkshakes  Cooked cereal Cold dry cereals (no nuts, dried fruit, coconut)  French toast or pancakes Crackers  Buttered toast Fruit juices or nectars  Noodles or pasta (no rice) Canned fruit  Potatoes (all types) Ripe bananas  Soft, cooked vegetables (no corn, lima, or baked beans) Peeled, ripe, fresh fruit  Creamed soups or vegetable soup Cakes (no nuts, dried fruit, coconut)  Canned chicken  noodle soup Plain doughnuts  Gravies Ice cream  Bacon dressing Pudding, mousse, custard  Sauces:  Cheese, creamed, barbecue, tomato, white Fruit ice, Italian ice, sherbet  Decaffeinated tea or coffee Whipped gelatin  Pork chops Regular gelatin     Canned fruited gelatin molds   Sugar, syrup, honey, jam, jelly   Cream   Non-dairy   Margarine   Oil   Mayonnaise   Ketchup   Mustard   TROUBLESHOOTING IRREGULAR BOWELS  1) Avoid extremes of bowel movements (no bad constipation/diarrhea)  2) Miralax 17gm mixed in 8oz. water or juice-daily. May use BID as needed.  3) Gas-x,Phazyme, etc. as needed for gas & bloating.  4) Soft,bland diet. No spicy,greasy,fried foods.  5) Prilosec over-the-counter as needed  6) May hold gluten/wheat products from diet to see if symptoms improve.  7) May try probiotics (Align, Activa, etc) to help calm the bowels down  7) If symptoms become worse call back immediately.    If you have any questions please call our office at [facility]: [phone].   LAPAROSCOPIC SURGERY: POST OP INSTRUCTIONS  ######################################################################  EAT Gradually transition to a high fiber diet with a fiber supplement over the next few weeks after discharge.  Start with a pureed / full liquid diet (see below)  WALK Walk an hour a day.  Control your pain to do that.    CONTROL PAIN Control pain so that you can walk, sleep, tolerate sneezing/coughing, go up/down stairs.  HAVE A BOWEL MOVEMENT DAILY Keep your bowels regular to avoid problems.  OK to try a laxative to override constipation.  OK to use an antidairrheal to slow down diarrhea.  Call if not better after 2 tries  CALL IF YOU HAVE PROBLEMS/CONCERNS Call if you are still struggling despite following these instructions. Call if you have concerns not answered by these  instructions  ######################################################################    1. DIET: Follow a light bland diet & liquids the first 24 hours after arrival home, such as soup, liquids, starches, etc.  Be sure to drink plenty of fluids.  Quickly advance to a usual solid diet within a few days.  Avoid fast food or heavy meals as your are more likely to get nauseated or have irregular bowels.  A low-fat, high-fiber diet for the rest of your life is ideal.  2. Take your usually prescribed home medications unless otherwise directed.  3. PAIN CONTROL: a. Pain is best controlled by a usual combination of three different methods TOGETHER: i. Ice/Heat ii. Over the counter pain medication iii. Prescription pain medication b. Most patients will experience some swelling and bruising around the incisions.  Ice packs or heating pads (30-60 minutes up to 6 times a day) will help. Use ice for the first few days to help decrease swelling and bruising, then switch to heat to help relax tight/sore spots and speed recovery.  Some people prefer to use ice alone, heat alone, alternating between ice & heat.  Experiment to what works for you.  Swelling and bruising can take several weeks to resolve.   c. It is helpful to take an over-the-counter pain medication regularly for the first few weeks.  Choose one of the following that works best for you: i. Naproxen (Aleve, etc)  Two 220mg tabs twice a day ii. Ibuprofen (Advil, etc) Three 200mg tabs four times a day (every meal & bedtime) iii. Acetaminophen (Tylenol, etc) 500-650mg four times a day (every meal & bedtime) d. A  prescription for pain medication (such as oxycodone, hydrocodone, tramadol, gabapentin, methocarbamol, etc) should be given to you upon discharge.  Take your pain medication as prescribed.  i. If you are having problems/concerns with the prescription medicine (does not control pain, nausea, vomiting, rash, itching, etc), please   call us [phone]  [phone] to see if we need to switch you to a different pain medicine that will work better for you and/or control your side effect better. ii. If you need a refill on your pain medication, please give us 48 hour notice.  contact your pharmacy.  They will contact our office to request authorization. Prescriptions will not be filled after 5 pm or on week-ends  4. Avoid getting constipated.   a. Between the surgery and the pain medications, it is common to experience some constipation.   b. Increasing fluid intake and taking a fiber supplement (such as Metamucil, Citrucel, FiberCon, MiraLax, etc) 1-2 times a day regularly will usually help prevent this problem from occurring.   c. A mild laxative (prune juice, Milk of Magnesia, MiraLax, etc) should be taken according to package directions if there are no bowel movements after 48 hours.   5. Watch out for diarrhea.   a. If you have many loose bowel movements, simplify your diet to bland foods & liquids for a few days.   b. Stop any stool softeners and decrease your fiber supplement.   c. Switching to mild anti-diarrheal medications (Kayopectate, Pepto Bismol) can help.   d. If this worsens or does not improve, please call us.  6. Wash / shower every day.  You may shower over the dressings as they are waterproof.  Continue to shower over incision(s) after the dressing is off.  7. Remove your waterproof bandages 5 days after surgery.  You may leave the incision open to air.  You may replace a dressing/Band-Aid to cover the incision for comfort if you wish.   8. ACTIVITIES as tolerated:   a. You may resume regular (light) daily activities beginning the next day--such as daily self-care, walking, climbing stairs--gradually increasing activities as tolerated.  If you can walk 30 minutes without difficulty, it is safe to try more intense activity such as jogging, treadmill, bicycling, low-impact aerobics, swimming, etc. b. Save the most intensive and  strenuous activity for last such as sit-ups, heavy lifting, contact sports, etc  Refrain from any heavy lifting or straining until you are off narcotics for pain control.   c. DO NOT PUSH THROUGH PAIN.  Let pain be your guide: If it hurts to do something, don't do it.  Pain is your body warning you to avoid that activity for another week until the pain goes down. d. You may drive when you are no longer taking prescription pain medication, you can comfortably wear a seatbelt, and you can safely maneuver your car and apply brakes. e. You may have sexual intercourse when it is comfortable.  9. FOLLOW UP in our office a. Please call [facility] at [phone] to set up an appointment to see your surgeon in the office for a follow-up appointment approximately 2-3 weeks after your surgery. b. Make sure that you call for this appointment the day you arrive home to insure a convenient appointment time.  10. IF YOU HAVE DISABILITY OR FAMILY LEAVE FORMS, BRING THEM TO THE OFFICE FOR PROCESSING.  DO NOT GIVE THEM TO YOUR DOCTOR.   WHEN TO CALL US [phone]: 1. Poor pain control 2. Reactions / problems with new medications (rash/itching, nausea, etc)  3. Fever over 101.5 F (38.5 C) 4. Inability to urinate 5. Nausea and/or vomiting 6. Worsening swelling or bruising 7. Continued bleeding from incision. 8. Increased pain, redness, or drainage from the incision   The clinic staff is available to   answer your questions during regular business hours (8:30am-5pm).  Please don't hesitate to call and ask to speak to one of our nurses for clinical concerns.   If you have a medical emergency, go to the nearest emergency room or call 911.  A surgeon from [facility] is always on call at the hospitals   [facility] [address], [address], [place], [place]  [address] ? MAIN: [phone] ? TOLL FREE: [phone] ?  FAX [phone] [email or web address]   

## 2019-03-30 NOTE — Anesthesia Procedure Notes (Signed)
Procedure Name: Intubation Date/Time: 03/30/2019 12:16 PM Performed by: Claudia Desanctis, CRNA Pre-anesthesia Checklist: Patient identified, Emergency Drugs available, Suction available and Patient being monitored Patient Re-evaluated:Patient Re-evaluated prior to induction Oxygen Delivery Method: Circle system utilized Preoxygenation: Pre-oxygenation with 100% oxygen Induction Type: IV induction Ventilation: Mask ventilation without difficulty Laryngoscope Size: 2 and Miller Grade View: Grade I Tube type: Oral Tube size: 7.5 mm Number of attempts: 1 Airway Equipment and Method: Stylet Placement Confirmation: ETT inserted through vocal cords under direct vision,  positive ETCO2 and breath sounds checked- equal and bilateral Secured at: 22 cm Tube secured with: Tape Dental Injury: Teeth and Oropharynx as per pre-operative assessment

## 2019-03-30 NOTE — H&P (Signed)
Douglas [name] Documented: 12/14/2018 9:20 AM  DOB: 02/13/1946  ` ` Patient sent for surgical consultation at the request of Dr Leonie Douglas, [name]  Chief Complaint: Large hiatal hernia with coughing episodes ` ` Pleasant gentleman sent for surgical consultation. He has a known large hiatal hernia. I actually operated on his daughter who had severe developmental delay for her very giant hiatal hernia and symptoms. That was 9 years ago. She did struggle with some postop A. fib and irregularities but ultimately stabilized improved. She seemed to have some improved quality of life from that but unfortunately passed away last year for other reasons.  Patient comes in today with his wife. He has had episodes of coughing and spitting up after eating. These seem to be new symptoms. Some early satiety's. He has had a history of some dysphagia to foods and liquids but he denies that that severe. Because of the concern of the cough, he saw gastroenterology. Negative cardiac work-up. Repeat endoscopy revealed his hiatal hernia larger. CT scan confirmed that as well. Now somewhat flipped. Not truly volvulized near obstructing but in more increased risk. Given the new symptoms, surgical consultation requested.  Patient notes he sleeps on a tilted bed but denies any severe heartburn or reflux. He is on Prilosec twice a day as well. His records that he is on the ranitidine at night but he did not remember that for certain. He does have sleep apnea controlled with the BiPAP machine. CPAP machine. Usually moves his bowels every day. Denies any major reflux with bending over although he does occasionally cough and get short of breath. Denies feeling a asthma. He is otherwise rather physically active for his age and can walk 20-30 minutes without difficulty.  He also history of coronary disease. He has been on Plavix anticoagulation since 2007. He did have some chest discomfort and  underwent coronary angiography. He had a drug-eluting stent placed in January 2020 I believe for 75% right coronary artery occlusion. He does not recall any severe symptoms and he has been otherwise active. Used to be followed by [name] closely. He has since retired. Now has Dr. [name].  No personal nor family history of GI/colon cancer, inflammatory bowel disease, irritable bowel syndrome, allergy such as Celiac Sprue, dietary/dairy problems, colitis, ulcers nor gastritis. No recent sick contacts/gastroenteritis. No travel outside the country. No changes in diet. No dysphagia to solids or liquids. No significant heartburn or reflux. No hematochezia, hematemesis, coffee ground emesis. No evidence of prior gastric/peptic ulceration.  Cleared by cardiology.  Plavix has been held.  Ready for surgery   (Review of systems as stated in this history (HPI) or in the review of systems. Otherwise all other 12 point ROS are negative) ` ` `   Past Surgical History Douglas [name], [name]; 12/14/2018 9:21 AM) Cataract Surgery  Left. Colon Polyp Removal - Colonoscopy  Laparoscopic Inguinal Hernia Surgery  Bilateral. Shoulder Surgery  Right. Tonsillectomy   Diagnostic Studies History Douglas [name], [name]; 12/14/2018 9:21 AM) Colonoscopy  within last year  Medication History (Douglas [name], [name]; 12/14/2018 9:23 AM) Colchicine (0.6MG  Capsule, Oral as needed) Active. Valtrex (1GM Tablet, Oral as needed) Active. Allegra Allergy (180MG  Tablet, Oral as needed) Active. Medications Reconciled  Social History Douglas [name], [name]; 12/14/2018 9:21 AM) Alcohol use  Remotely quit alcohol use. Caffeine use  Coffee, Tea. No drug use  Tobacco use  Former smoker.  Family History Douglas [name], Douglas [name]; 12/14/2018 9:21 AM) Alcohol Abuse  Brother. Colon  Cancer  Mother. Diabetes Mellitus  Father. Heart Disease  Daughter, Mother, Father. Hypertension   Mother.  Other Problems Douglas [name], [name]; 12/14/2018 9:21 AM) Diverticulosis  Gastroesophageal Reflux Disease  High blood pressure  Inguinal Hernia  Kidney Stone  Myocardial infarction  Sleep Apnea  Umbilical Hernia Repair     Review of Systems (Douglas [name]; 12/14/2018 9:21 AM) General Not Present- Appetite Loss, Chills, Fatigue, Fever, Night Sweats, Weight Gain and Weight Loss. Skin Not Present- Change in Wart/Mole, Dryness, Hives, Jaundice, New Lesions, Non-Healing Wounds, Rash and Ulcer. HEENT Not Present- Earache, Hearing Loss, Hoarseness, Nose Bleed, Oral Ulcers, Ringing in the Ears, Seasonal Allergies, Sinus Pain, Sore Throat, Visual Disturbances, Wears glasses/contact lenses and Yellow Eyes. Respiratory Not Present- Bloody sputum, Chronic Cough, Difficulty Breathing, Snoring and Wheezing. Breast Not Present- Breast Mass, Breast Pain, Nipple Discharge and Skin Changes. Cardiovascular Not Present- Chest Pain, Difficulty Breathing Lying Down, Leg Cramps, Palpitations, Rapid Heart Rate, Shortness of Breath and Swelling of Extremities. Gastrointestinal Not Present- Abdominal Pain, Bloating, Bloody Stool, Change in Bowel Habits, Chronic diarrhea, Constipation, Difficulty Swallowing, Excessive gas, Gets full quickly at meals, Hemorrhoids, Indigestion, Nausea, Rectal Pain and Vomiting. Male Genitourinary Not Present- Blood in Urine, Change in Urinary Stream, Frequency, Impotence, Nocturia, Painful Urination, Urgency and Urine Leakage.  Vitals (Douglas [name]; 12/14/2018 9:21 AM) 12/14/2018 9:21 AM Weight: 198 lb Height: 67in Body Surface Area: 2.01 m Body Mass Index: 31.01 kg/m  Temp.: 41F(Oral)  Pulse: 52 (Regular)  BP: 118/78 (Sitting, Left Arm, Standard)    03/30/2019 BP (!) 145/95    Pulse (!) 55    Temp (!) 97.4 F (36.3 C) (Oral)    Resp 14    SpO2 98%     Physical Exam Adin Hector MD; 12/15/2018 8:39 AM) General Mental  Status-Alert. General Appearance-Not in acute distress, Not Sickly. Orientation-Oriented X3. Hydration-Well hydrated. Voice-Normal.  Integumentary Global Assessment Upon inspection and palpation of skin surfaces of the - Axillae: non-tender, no inflammation or ulceration, no drainage. and Distribution of scalp and body hair is normal. General Characteristics Temperature - normal warmth is noted.  Head and Neck Head-normocephalic, atraumatic with no lesions or palpable masses. Face Global Assessment - atraumatic, no absence of expression. Neck Global Assessment - no abnormal movements, no bruit auscultated on the right, no bruit auscultated on the left, no decreased range of motion, non-tender. Trachea-midline. Thyroid Gland Characteristics - non-tender.  Eye Eyeball - Left-Extraocular movements intact, No Nystagmus - Left. Eyeball - Right-Extraocular movements intact, No Nystagmus - Right. Cornea - Left-No Hazy - Left. Cornea - Right-No Hazy - Right. Sclera/Conjunctiva - Left-No scleral icterus, No Discharge - Left. Sclera/Conjunctiva - Right-No scleral icterus, No Discharge - Right. Pupil - Left-Direct reaction to light normal. Pupil - Right-Direct reaction to light normal.  ENMT Ears Pinna - Left - no drainage observed, no generalized tenderness observed. Pinna - Right - no drainage observed, no generalized tenderness observed. Nose and Sinuses External Inspection of the Nose - no destructive lesion observed. Inspection of the nares - Left - quiet respiration. Inspection of the nares - Right - quiet respiration. Mouth and Throat Lips - Upper Lip - no fissures observed, no pallor noted. Lower Lip - no fissures observed, no pallor noted. Nasopharynx - no discharge present. Oral Cavity/Oropharynx - Tongue - no dryness observed. Oral Mucosa - no cyanosis observed. Hypopharynx - no evidence of airway distress observed.  Chest and Lung  Exam Inspection Movements - Normal and Symmetrical. Accessory muscles - No  use of accessory muscles in breathing. Palpation Palpation of the chest reveals - Non-tender. Auscultation Breath sounds - Normal and Clear.  Cardiovascular Auscultation Rhythm - Regular. Murmurs & Other Heart Sounds - Auscultation of the heart reveals - No Murmurs and No Systolic Clicks.  Abdomen Inspection Inspection of the abdomen reveals - No Visible peristalsis and No Abnormal pulsations. Umbilicus - No Bleeding, No Urine drainage. Palpation/Percussion Palpation and Percussion of the abdomen reveal - Soft, Non Tender, No Rebound tenderness, No Rigidity (guarding) and No Cutaneous hyperesthesia. Note: Abdomen obese but soft. Nontender. Not distended. No umbilical or incisional hernias. No guarding.   Male Genitourinary Sexual Maturity Tanner 5 - Adult hair pattern and Adult penile size and shape.  Peripheral Vascular Upper Extremity Inspection - Left - No Cyanotic nailbeds - Left, Not Ischemic. Inspection - Right - No Cyanotic nailbeds - Right, Not Ischemic.  Neurologic Neurologic evaluation reveals -normal attention span and ability to concentrate, able to name objects and repeat phrases. Appropriate fund of knowledge , normal sensation and normal coordination. Mental Status Affect - not angry, not paranoid. Cranial Nerves-Normal Bilaterally. Gait-Normal.  Neuropsychiatric Mental status exam performed with findings of-able to articulate well with normal speech/language, rate, volume and coherence, thought content normal with ability to perform basic computations and apply abstract reasoning and no evidence of hallucinations, delusions, obsessions or homicidal/suicidal ideation.  Musculoskeletal Global Assessment Spine, Ribs and Pelvis - no instability, subluxation or laxity. Right Upper Extremity - no instability, subluxation or laxity.  Lymphatic Head & Neck  General Head & Neck  Lymphatics: Bilateral - Description - No Localized lymphadenopathy. Axillary  General Axillary Region: Bilateral - Description - No Localized lymphadenopathy. Femoral & Inguinal  Generalized Femoral & Inguinal Lymphatics: Left - Description - No Localized lymphadenopathy. Right - Description - No Localized lymphadenopathy.    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 I think he is more symptomatic than he lets on. I think these coughing episodes are actually dysphasia episodes with occasional aspiration and spitting up. However it is not massive. He has had this hiatal hernia for many years. It is definitely larger. Now all of the stomach is up in there. My instinct is that he would benefit from surgery to get this fixed. While he is 37, he is quite healthy and active. This will only get worse. It will become more of a problem. Most likely would be of benefit to fix it now before becomes something urgent or emergent or progressively symptomatic.  However, there is no serious issue likes uncontrolled nausea/vomiting, severe bleeding, severe chest pain. He seems guarded against proceeding but his wife wonders if he should proceed. I think he is worried about cardiac issues postop like his daughter did. I did note that is certainly a risk with the hiatal hernia causing some dysrhythmias or stress to the heart and lungs in the short-term. However those are usually temporary & managable. Certainly should help in the long-term did not have the entire stomach pushing on his heart and lungs flipped upside down.  Cleared by cardiology.  Off anticoagulation.  Manometry showing good esophageal function with 90% peristalsis.  The anatomy & physiology of the foregut and anti-reflux mechanism was discussed. The pathophysiology of hiatal herniation and GERD was discussed. Natural history risks without surgery was  discussed. The patient's symptoms are not adequately controlled by medicines and other non-operative treatments. I feel the risks of no intervention will lead to serious problems  that outweigh the operative risks; therefore, I recommended surgery to reduce the hiatal hernia out of the chest and fundoplication to rebuild the anti-reflux valve and control reflux better. Need for a thorough workup to rule out the differential diagnosis and plan treatment was explained. I explained laparoscopic techniques with possible need for an open approach.  Risks such as bleeding, infection, abscess, leak, need for further treatment, heart attack, death, and other risks were discussed. I noted a good likelihood this will help address the problem. Goals of post-operative recovery were discussed as well. Possibility that this will not correct all symptoms was explained. Post-operative dysphagia, need for short-term liquid & pureed diet, inability to vomit, possibility of reherniation, possible need for medicines to help control symptoms in addition to surgery were discussed. We will work to minimize complications. Educational handouts further explaining the pathology, treatment options, and dysphagia diet was given as well. Questions were answered. The patient expresses understanding & wishes to proceed with surgery.  Referred to Gastroenterology, for evaluation and follow up Physiological scientist). Routine.   Adin Hector, MD, FACS, MASCRS Gastrointestinal and Minimally Invasive Surgery  [facility] [address], [place], [place] [address] [phone] Main / Paging [phone] Fax

## 2019-03-30 NOTE — Interval H&P Note (Signed)
History and Physical Interval Note:  03/30/2019 11:37 AM  Douglas [name]  has presented today for surgery, with the diagnosis of paraesophageal hiatal hernia.  The various methods of treatment have been discussed with the patient and family. After consideration of risks, benefits and other options for treatment, the patient has consented to  Procedure(s): ROBOTIC REPAIR OF PARAESOPHAGEAL HIATAL HERNIA WITH FUNDOPLICATION (N/A) as a surgical intervention.  The patient's history has been reviewed, patient examined, no change in status, stable for surgery.  I have reviewed the patient's chart and labs.  Questions were answered to the patient's satisfaction.     Adin Hector

## 2019-03-31 ENCOUNTER — Inpatient Hospital Stay (HOSPITAL_COMMUNITY): Payer: Medicare Other

## 2019-03-31 DIAGNOSIS — R739 Hyperglycemia, unspecified: Secondary | ICD-10-CM

## 2019-03-31 LAB — CBC
HCT: 31.7 % — ABNORMAL LOW (ref 39.0–52.0)
Hemoglobin: 10.4 g/dL — ABNORMAL LOW (ref 13.0–17.0)
MCH: 30.5 pg (ref 26.0–34.0)
MCHC: 32.8 g/dL (ref 30.0–36.0)
MCV: 93 fL (ref 80.0–100.0)
Platelets: 127 10*3/uL — ABNORMAL LOW (ref 150–400)
RBC: 3.41 MIL/uL — ABNORMAL LOW (ref 4.22–5.81)
RDW: 13.8 % (ref 11.5–15.5)
WBC: 11.7 10*3/uL — ABNORMAL HIGH (ref 4.0–10.5)
nRBC: 0 % (ref 0.0–0.2)

## 2019-03-31 LAB — GLUCOSE, CAPILLARY
Glucose-Capillary: 194 mg/dL — ABNORMAL HIGH (ref 70–99)
Glucose-Capillary: 201 mg/dL — ABNORMAL HIGH (ref 70–99)
Glucose-Capillary: 210 mg/dL — ABNORMAL HIGH (ref 70–99)
Glucose-Capillary: 146 mg/dL — ABNORMAL HIGH (ref 70–99)

## 2019-03-31 LAB — MAGNESIUM: Magnesium: 1.8 mg/dL (ref 1.7–2.4)

## 2019-03-31 LAB — BASIC METABOLIC PANEL
Anion gap: 9 (ref 5–15)
BUN: 33 mg/dL — ABNORMAL HIGH (ref 8–23)
CO2: 21 mmol/L — ABNORMAL LOW (ref 22–32)
Calcium: 7.9 mg/dL — ABNORMAL LOW (ref 8.9–10.3)
Chloride: 107 mmol/L (ref 98–111)
Creatinine, Ser: 1.59 mg/dL — ABNORMAL HIGH (ref 0.61–1.24)
GFR calc Af Amer: 49 mL/min — ABNORMAL LOW (ref >60)
GFR calc non Af Amer: 42 mL/min — ABNORMAL LOW (ref >60)
Glucose, Bld: 197 mg/dL — ABNORMAL HIGH (ref 70–99)
Potassium: 5.3 mmol/L — ABNORMAL HIGH (ref 3.5–5.1)
Sodium: 137 mmol/L (ref 135–145)

## 2019-03-31 LAB — HEMOGLOBIN A1C
Hgb A1c MFr Bld: 6.4 % — ABNORMAL HIGH (ref 4.8–5.6)
Mean Plasma Glucose: 136.98 mg/dL

## 2019-03-31 MED ORDER — GABAPENTIN 250 MG/5ML PO SOLN
200.0000 mg | Freq: Three times a day (TID) | ORAL | Status: DC
  Administered 2019-03-31 – 2019-04-03 (×7): 200 mg via ORAL
  Filled 2019-03-31 (×12): qty 4

## 2019-03-31 MED ORDER — INSULIN ASPART 100 UNIT/ML ~~LOC~~ SOLN
0.0000 [IU] | Freq: Three times a day (TID) | SUBCUTANEOUS | Status: DC
  Administered 2019-03-31: 5 [IU] via SUBCUTANEOUS
  Administered 2019-03-31: 09:00:00 3 [IU] via SUBCUTANEOUS
  Administered 2019-03-31 – 2019-04-01 (×2): 5 [IU] via SUBCUTANEOUS
  Administered 2019-04-01: 2 [IU] via SUBCUTANEOUS
  Administered 2019-04-01 – 2019-04-02 (×4): 3 [IU] via SUBCUTANEOUS
  Administered 2019-04-03: 2 [IU] via SUBCUTANEOUS

## 2019-03-31 MED ORDER — INSULIN ASPART 100 UNIT/ML ~~LOC~~ SOLN: 0.0000 [IU] | Freq: Every day | SUBCUTANEOUS | Status: DC

## 2019-03-31 MED ORDER — IOHEXOL 300 MG/ML  SOLN
150.0000 mL | Freq: Once | INTRAMUSCULAR | Status: AC | PRN
  Administered 2019-03-31: 150 mL via ORAL

## 2019-03-31 MED ORDER — GABAPENTIN 250 MG/5ML PO SOLN
200.0000 mg | Freq: Three times a day (TID) | ORAL | Status: DC
  Filled 2019-03-31 (×2): qty 4

## 2019-03-31 MED ORDER — LACTATED RINGERS IV BOLUS
1000.0000 mL | Freq: Once | INTRAVENOUS | Status: AC
  Administered 2019-03-31: 08:00:00 1000 mL via INTRAVENOUS

## 2019-03-31 NOTE — Progress Notes (Addendum)
Douglas [name] [id number] 30-Mar-1946  CARE TEAM:  PCP: [name], MD  Outpatient Care Team: Patient Care Team: [name], MD as PCP - General (Family Medicine) [name], MD as PCP - Cardiology (Cardiology) [name], MD as Consulting Physician (General Surgery) Laurence Spates, MD as Consulting Physician (Gastroenterology) Arta Silence, MD as Consulting Physician (Gastroenterology) Rutherford Guys, MD as Consulting Physician (Ophthalmology)  Inpatient Treatment Team: Treatment Team: Attending Provider: [name], MD; Technician: Leda Quail, NT; Technician: Sue Lush, [place]; Registered Nurse: Fuller Canada, RN; Registered Nurse: Virl Cagey, RN; Registered Nurse: Wray Kearns, RN   Problem List:   Principal Problem:   Incarcerated hiatal hernia Active Problems:   Recurrent angina status post DES stent placement Jan 2020   Ischemic heart disease   Dyslipidemia   Iron deficiency anemia   Obstructive sleep apnea   PAF (paroxysmal atrial fibrillation) (HCC)   Ischemic cardiomyopathy   Chronic anticoagulation   Obesity (BMI 30-39.9)   1 Day Post-Op  03/30/2019  POST-OPERATIVE DIAGNOSIS:   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 ROBOTIC reduction of paraesophageal hiatal hernia 2. Type II mediastinal dissection. 3. RIGHT crural fascial relaxation  3. Primary repair of hiatal hernia over pledgets.  4. Anterior & posterior gastropexy. 5. Toupet (Partial posterior [id number] degree) fundoplication x 3 cm 6. Mesh reinforcement with absorbable Phasix mesh  SURGEON:  Adin Hector, MD  Assessment  Recovering  [facility] Stay = 1 days)  Plan:  -Esophagram this morning.  If no leak or obstruction, advance to dysphagia 1 pured diet.  If evidence of obstruction or slow passage, keep on clears.  Aggressive nausea control.  Decadron  steroids x3 days given edema and swelling.  Elevated creatinine most likely reflection of some postoperative anemia blood loss but nonoliguric.  Should improve.  IV fluid bolus as needed.  Stepdown unit status for now.  Perhaps transition to floor later today or in the morning if improved.  Hypertension.  Low threshold to hold Norvasc.  Continue Coreg.  Parameters to hold if hypotensive.  Hyperglycemia.  No prior documentation of diabetes.  Perhaps related to receiving IV steroids.  Placed on sliding scale for now and follow.  Check hemoglobin A1c.  -VTE prophylaxis- SCDs, etc. resume Lovenox and follow.  Hold off on Plavix anticoagulation till hemoglobin stable x48 hours   -mobilize as tolerated to help recovery  30 minutes spent in review, evaluation, examination, counseling, and coordination of care.  More than 50% of that time was spent in counseling.  03/31/2019    Subjective: (Chief complaint)  Sitting up in chair.  Stepdown nurses in room helping.  Drained a fair amount of his mediastinal drain at first but tapering off.  Pain controlled.  More alert.  Tolerating sips of liquids.  No nausea vomiting or retching.  Objective:  Vital signs:  Vitals:   03/31/19 0300 03/31/19 0308 03/31/19 0400 03/31/19 0600  BP: 100/70  107/73 113/82  Pulse: 88  93 94  Resp: 17  (!) 26 (!) 24  Temp:  98.2 F (36.8 C)    TempSrc:  Axillary    SpO2: 91%  91% (!) 88%  Weight:      Height:           Intake/Output   Yesterday:  12/09 0701 - 12/10 0700 In: 5523.1 [P.O.:20; I.V.:3309.2; IV Piggyback:2194] Out: 1600 [Urine:500; Drains:800; Blood:300] This shift:  No intake/output data recorded.  Bowel function:  Flatus: YES  BM:  No  Drain: Serosanguinous   Physical Exam:  General: Pt awake/alert/oriented x4 in no acute distress Eyes: PERRL, normal EOM.  Sclera clear.  No icterus Neuro: CN II-XII intact w/o focal sensory/motor deficits. Lymph: No head/neck/groin  lymphadenopathy Psych:  No delerium/psychosis/paranoia HENT: Normocephalic, Mucus membranes moist.  No thrush Neck: Supple, No tracheal deviation Chest: No chest wall pain w good excursion CV:  Pulses intact.  Regular rhythm MS: Normal AROM mjr joints.  No obvious deformity  Abdomen: Soft.  Mildy distended.  Mildly tender at incisions only.  No evidence of peritonitis.  No incarcerated hernias.  Ext:  No deformity.  No mjr edema.  No cyanosis Skin: No petechiae / purpura  Results:   Cultures: Recent Results (from the past 720 hour(s))  Novel Coronavirus, NAA (Hosp order, Send-out to Ref Lab; TAT 18-24 hrs     Status: None   Collection Time: 03/26/19  9:06 AM   Specimen: Nasopharyngeal Swab; Respiratory  Result Value Ref Range Status   SARS-CoV-2, NAA NOT DETECTED NOT DETECTED Final    Comment: (NOTE) This nucleic acid amplification test was developed and its performance characteristics determined by [employer]. Nucleic acid amplification tests include PCR and TMA. This test has not been FDA cleared or approved. This test has been authorized by FDA under an Emergency Use Authorization (EUA). This test is only authorized for the duration of time the declaration that circumstances exist justifying the authorization of the emergency use of in vitro diagnostic tests for detection of SARS-CoV-2 virus and/or diagnosis of COVID-19 infection under section 564(b)(1) of the Act, 21 U.S.C. [id number]) (1), unless the authorization is terminated or revoked sooner. When diagnostic testing is negative, the possibility of a false negative result should be considered in the context of a patient's recent exposures and the presence of clinical signs and symptoms consistent with COVID-19. An individual without symptoms of COVID- 19 and who is not shedding SARS-CoV-2 vi rus would expect to have a negative (not detected) result in this assay. Performed At: [facility] [address] [place], [place] [id number] Rush Farmer MD [id number]    [place]  Final    Comment: Performed at [facility], [place] [address] [place], [place] [address]  MRSA PCR Screening     Status: None   Collection Time: 03/30/19 10:16 PM   Specimen: Nasal Mucosa; Nasopharyngeal  Result Value Ref Range Status   MRSA by PCR NEGATIVE NEGATIVE Final    Comment:        The GeneXpert MRSA Assay (FDA approved for NASAL specimens only), is one component of a comprehensive MRSA colonization surveillance program. It is not intended to diagnose MRSA infection nor to guide or monitor treatment for MRSA infections. Performed at [facility], [place] [address], [place], [place] [address]     Labs: Results for orders placed or performed during the hospital encounter of 03/30/19 (from the past 48 hour(s))  Glucose, capillary     Status: Abnormal   Collection Time: 03/30/19  5:54 PM  Result Value Ref Range   Glucose-Capillary 185 (H) 70 - 99 mg/dL  POCT I-Stat EG7     Status: Abnormal   Collection Time: 03/30/19  6:13 PM  Result Value Ref Range   pH, Ven 7.166 (LL) 7.250 - 7.430   pCO2, Ven 53.2 44.0 - 60.0 mmHg   pO2, Ven 58.0 (H) 32.0 - 45.0 mmHg   Bicarbonate 19.2 (  L) 20.0 - 28.0 mmol/L   TCO2 21 (L) 22 - 32 mmol/L   O2 Saturation 81.0 %   Acid-base deficit 10.0 (H) 0.0 - 2.0 mmol/L   Sodium 137 135 - 145 mmol/L   Potassium 5.9 (H) 3.5 - 5.1 mmol/L   Calcium, Ion 1.15 1.15 - 1.40 mmol/L   HCT 37.0 (L) 39.0 - 52.0 %   Hemoglobin 12.6 (L) 13.0 - 17.0 g/dL   Patient temperature HIDE    Sample type VENOUS   ABO/Rh     Status: None   Collection Time: 03/30/19  6:20 PM  Result Value Ref Range   ABO/RH(D)      A POS Performed at [facility], [place] [address], [place], [place] [address]   Type and screen [place]     Status: None   Collection Time: 03/30/19  6:25 PM  Result Value Ref Range    ABO/RH(D) A POS    Antibody Screen NEG    Sample Expiration      04/02/2019,2359 Performed at [facility], [place] [address], [place], [place] [address]   CBC     Status: Abnormal   Collection Time: 03/30/19  9:59 PM  Result Value Ref Range   WBC 15.7 (H) 4.0 - 10.5 K/uL   RBC 3.54 (L) 4.22 - 5.81 MIL/uL   Hemoglobin 10.9 (L) 13.0 - 17.0 g/dL   HCT 33.8 (L) 39.0 - 52.0 %   MCV 95.5 80.0 - 100.0 fL   MCH 30.8 26.0 - 34.0 pg   MCHC 32.2 30.0 - 36.0 g/dL   RDW 13.7 11.5 - 15.5 %   Platelets 133 (L) 150 - 400 K/uL   nRBC 0.0 0.0 - 0.2 %    Comment: Performed at [facility], [place] [address] [place], [place] [address]  MRSA PCR Screening     Status: None   Collection Time: 03/30/19 10:16 PM   Specimen: Nasal Mucosa; Nasopharyngeal  Result Value Ref Range   MRSA by PCR NEGATIVE NEGATIVE    Comment:        The GeneXpert MRSA Assay (FDA approved for NASAL specimens only), is one component of a comprehensive MRSA colonization surveillance program. It is not intended to diagnose MRSA infection nor to guide or monitor treatment for MRSA infections. Performed at [facility], [place] [address] [place], [place] [address]   BMET in AM     Status: Abnormal   Collection Time: 03/31/19  3:28 AM  Result Value Ref Range   Sodium 137 135 - 145 mmol/L   Potassium 5.3 (H) 3.5 - 5.1 mmol/L   Chloride 107 98 - 111 mmol/L   CO2 21 (L) 22 - 32 mmol/L   Glucose, Bld 197 (H) 70 - 99 mg/dL   BUN 33 (H) 8 - 23 mg/dL   Creatinine, Ser 1.59 (H) 0.61 - 1.24 mg/dL   Calcium 7.9 (L) 8.9 - 10.3 mg/dL   GFR calc non Af Amer 42 (L) >60 mL/min   GFR calc Af Amer 49 (L) >60 mL/min   Anion gap 9 5 - 15    Comment: Performed at [facility], [place] [address] [place], [place] [address]  CBC in AM     Status: Abnormal   Collection Time: 03/31/19  3:28 AM  Result Value Ref Range   WBC 11.7 (H) 4.0 - 10.5 K/uL   RBC 3.41 (L) 4.22 - 5.81  MIL/uL  Hemoglobin 10.4 (L) 13.0 - 17.0 g/dL   HCT 31.7 (L) 39.0 - 52.0 %   MCV 93.0 80.0 - 100.0 fL   MCH 30.5 26.0 - 34.0 pg   MCHC 32.8 30.0 - 36.0 g/dL   RDW 13.8 11.5 - 15.5 %   Platelets 127 (L) 150 - 400 K/uL   nRBC 0.0 0.0 - 0.2 %    Comment: Performed at [facility], [place] [address] [place], [place] [address]  Magnesium in AM     Status: None   Collection Time: 03/31/19  3:28 AM  Result Value Ref Range   Magnesium 1.8 1.7 - 2.4 mg/dL    Comment: Performed at [facility], [place] [address], [place], [place] [address]    Imaging / Studies: No results found.  Medications / Allergies: per chart  Antibiotics: Anti-infectives (From admission, onward)   Start     Dose/Rate Route Frequency Ordered Stop   03/30/19 2025  valACYclovir (VALTREX) tablet 1,000 mg     1,000 mg Oral 2 times daily PRN 03/30/19 2026     03/30/19 1015  cefTRIAXone (ROCEPHIN) 2 g in sodium chloride 0.9 % 100 mL IVPB     2 g 200 mL/hr over 30 Minutes Intravenous On call to O.R. 03/30/19 1012 03/30/19 1236   03/30/19 1015  metroNIDAZOLE (FLAGYL) IVPB 500 mg     500 mg 100 mL/hr over 60 Minutes Intravenous On call to O.R. 03/30/19 1012 03/30/19 1215        Note: Portions of this report may have been transcribed using voice recognition software. Every effort was made to ensure accuracy; however, inadvertent computerized transcription errors may be present.   Any transcriptional errors that result from this process are unintentional.     Adin Hector, MD, FACS, MASCRS Gastrointestinal and Minimally Invasive Surgery    [address], [place],  [address] [phone] Main / Paging [phone] Fax

## 2019-03-31 NOTE — Progress Notes (Signed)
Pt states that he will self administer CPAP when ready for bed.  RT to monitor and assess as needed.  

## 2019-04-01 DIAGNOSIS — N179 Acute kidney failure, unspecified: Secondary | ICD-10-CM

## 2019-04-01 LAB — HEMOGLOBIN: Hemoglobin: 9.1 g/dL — ABNORMAL LOW (ref 13.0–17.0)

## 2019-04-01 LAB — GLUCOSE, CAPILLARY
Glucose-Capillary: 165 mg/dL — ABNORMAL HIGH (ref 70–99)
Glucose-Capillary: 230 mg/dL — ABNORMAL HIGH (ref 70–99)
Glucose-Capillary: 145 mg/dL — ABNORMAL HIGH (ref 70–99)
Glucose-Capillary: 154 mg/dL — ABNORMAL HIGH (ref 70–99)

## 2019-04-01 LAB — CREATININE, SERUM
Creatinine, Ser: 1.9 mg/dL — ABNORMAL HIGH (ref 0.61–1.24)
GFR calc Af Amer: 40 mL/min — ABNORMAL LOW (ref >60)
GFR calc non Af Amer: 34 mL/min — ABNORMAL LOW (ref >60)

## 2019-04-01 LAB — POTASSIUM: Potassium: 5.5 mmol/L — ABNORMAL HIGH (ref 3.5–5.1)

## 2019-04-01 MED ORDER — SODIUM CHLORIDE 0.9 % IV SOLN
INTRAVENOUS | Status: DC
  Administered 2019-04-01: 09:00:00 via INTRAVENOUS

## 2019-04-01 MED ORDER — CARVEDILOL 6.25 MG PO TABS
6.2500 mg | ORAL_TABLET | Freq: Two times a day (BID) | ORAL | Status: DC
  Administered 2019-04-01 – 2019-04-03 (×5): 6.25 mg via ORAL
  Filled 2019-04-01 (×5): qty 1

## 2019-04-01 MED ORDER — SODIUM CHLORIDE 0.9 % IV BOLUS
1000.0000 mL | Freq: Once | INTRAVENOUS | Status: AC
  Administered 2019-04-01: 1000 mL via INTRAVENOUS

## 2019-04-01 MED ORDER — SODIUM CHLORIDE 0.9 % IV BOLUS: 1000.0000 mL | Freq: Three times a day (TID) | INTRAVENOUS | Status: AC | PRN

## 2019-04-01 NOTE — Progress Notes (Signed)
NUTRITION NOTE  Consult received for diet education for pureed diet for 2-3 weeks post-op. Patient is POD #2 robotic reduction of paraesophageal hiatal hernia with dissection and fascial relaxation; anterior and posterior gastropexy; mesh placement. Esophagram done 12/10 AM and did not show any sign of leak or obstruction.   Patient currently on Dysphagia 1, thin liquids. Patient reports eating 100% of breakfast this AM and that he was feeling very hungry. His wife was at bedside. Diet information reviewed with patient and his wife and handouts provided to them as well. Rationale for current diet reviewed. They deny any nutrition-related questions or concerns but are aware to alert RN or MD if nutrition-related concerns arise so that RD can be re-consulted.     Jarome Matin, MS, RD, LDN, [facility] Inpatient Clinical Dietitian Pager # [phone] After hours/weekend pager # [phone]

## 2019-04-01 NOTE — Progress Notes (Signed)
Jaleek Leavins [id number] 1945/07/29  CARE TEAM:  PCP: [name], MD  Outpatient Care Team: Patient Care Team: [name], MD as PCP - General (Family Medicine) [name], MD as PCP - Cardiology (Cardiology) [name], MD as Consulting Physician (General Surgery) Laurence Spates, MD as Consulting Physician (Gastroenterology) Arta Silence, MD as Consulting Physician (Gastroenterology) Rutherford Guys, MD as Consulting Physician (Ophthalmology)  Inpatient Treatment Team: Treatment Team: Attending Provider: [name], MD; Technician: Leda Quail, NT; Technician: Sue Lush, [place]; Registered Nurse: Virl Cagey, RN; Registered Nurse: Fuller Canada, RN; Registered Nurse: Camie Patience, RN; Utilization Review: Tressie Stalker, RN   Problem List:   Principal Problem:   Incarcerated hiatal hernia s/p robotic repair/Toupet 03/30/2019 Active Problems:   Recurrent angina status post DES stent placement Jan 2020   Ischemic heart disease   Dyslipidemia   Iron deficiency anemia   Obstructive sleep apnea   PAF (paroxysmal atrial fibrillation) ([place])   Ischemic cardiomyopathy   Chronic anticoagulation   Obesity (BMI 30-39.9)   Hyperglycemia   2 Days Post-Op  03/30/2019  POST-OPERATIVE DIAGNOSIS:   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 ROBOTIC reduction of paraesophageal hiatal hernia 2. Type II mediastinal dissection. 3. RIGHT crural fascial relaxation  3. Primary repair of hiatal hernia over pledgets.  4. Anterior & posterior gastropexy. 5. Toupet (Partial posterior [id number] degree) fundoplication x 3 cm 6. Mesh reinforcement with absorbable Phasix mesh  SURGEON:  Adin Hector, MD  Assessment  Recovering  [facility] Stay = 2 days)  Plan:  Transfer to floor  Increased creatinine most likely due to dehydration/short-term acute kidney  injury.  He notes he is making urine again.  IV fluid boluses.  Follow p.o. intake.  I suspect he is not drinking/eating much.  Borderline hyperkalemia.  Avoiding extra potassium.  Follow closely.  Esophagram with no leak or obstruction, advanced to dysphagia 1 pured diet.  Calorie counts.  Teach pured diet   Nausea control.  Decadron steroids x3 days given edema and swelling.  Hypertension.  With increase creatinine & borderline low blood pressure, will hold Norvasc and cut back on Coreg with as needed backup.  Parameters to hold if hypotensive.  Hyperglycemia.  No prior documentation of diabetes.  Perhaps related to receiving IV steroids.  Placed on sliding scale for now and follow.  Check hemoglobin A1c.  -VTE prophylaxis- SCDs, etc. resume Lovenox and follow.  Hold off on Plavix anticoagulation till hemoglobin stable x48 hours   -mobilize as tolerated to help recovery  30 minutes spent in review, evaluation, examination, counseling, and coordination of care.  More than 50% of that time was spent in counseling.  04/01/2019    Subjective: (Chief complaint)  Sitting up in chair.  Breathing better.  Denies pain.  Feels like the mediastinal drain is tapering off  Objective:  Vital signs:  Vitals:   04/01/19 0200 04/01/19 0300 04/01/19 0400 04/01/19 0700  BP: 115/78 116/77  99/79  Pulse: 79 78  69  Resp: 11 12  20   Temp:   97.6 F (36.4 C)   TempSrc:   Axillary   SpO2: 90% 91%  94%  Weight:      Height:           Intake/Output   Yesterday:  12/10 0701 - 12/11 0700 In: -  Out: 425 [Urine:150; Drains:275] This shift:  No intake/output data recorded.  Bowel function:  Flatus:  YES  BM:  No  Drain: Serosanguinous   Physical Exam:  General: Pt awake/alert/oriented x4 in no acute distress Eyes: PERRL, normal EOM.  Sclera clear.  No icterus Neuro: CN II-XII intact w/o focal sensory/motor deficits. Lymph: No head/neck/groin lymphadenopathy Psych:  No  delerium/psychosis/paranoia HENT: Normocephalic, Mucus membranes moist.  No thrush Neck: Supple, No tracheal deviation Chest: No chest wall pain w good excursion CV:  Pulses intact.  Regular rhythm MS: Normal AROM mjr joints.  No obvious deformity  Abdomen:  Obese.   Soft.  Mildy distended.  Mildly tender at incisions only.  No evidence of peritonitis.  No incarcerated hernias.  Ext:  No deformity.  No mjr edema.  No cyanosis Skin: No petechiae / purpura  Results:   Cultures: Recent Results (from the past 720 hour(s))  Novel Coronavirus, NAA (Hosp order, Send-out to Ref Lab; TAT 18-24 hrs     Status: None   Collection Time: 03/26/19  9:06 AM   Specimen: Nasopharyngeal Swab; Respiratory  Result Value Ref Range Status   SARS-CoV-2, NAA NOT DETECTED NOT DETECTED Final    Comment: (NOTE) This nucleic acid amplification test was developed and its performance characteristics determined by [employer]. Nucleic acid amplification tests include PCR and TMA. This test has not been FDA cleared or approved. This test has been authorized by FDA under an Emergency Use Authorization (EUA). This test is only authorized for the duration of time the declaration that circumstances exist justifying the authorization of the emergency use of in vitro diagnostic tests for detection of SARS-CoV-2 virus and/or diagnosis of COVID-19 infection under section 564(b)(1) of the Act, 21 U.S.C. [id number]) (1), unless the authorization is terminated or revoked sooner. When diagnostic testing is negative, the possibility of a false negative result should be considered in the context of a patient's recent exposures and the presence of clinical signs and symptoms consistent with COVID-19. An individual without symptoms of COVID- 19 and who is not shedding SARS-CoV-2 vi rus would expect to have a negative (not detected) result in this assay. Performed At: [facility] [address] [place],  [place] [id number] Rush Farmer MD [id number]    [place]  Final    Comment: Performed at [facility], [place] [address], [place], [place] [address]  MRSA PCR Screening     Status: None   Collection Time: 03/30/19 10:16 PM   Specimen: Nasal Mucosa; Nasopharyngeal  Result Value Ref Range Status   MRSA by PCR NEGATIVE NEGATIVE Final    Comment:        The GeneXpert MRSA Assay (FDA approved for NASAL specimens only), is one component of a comprehensive MRSA colonization surveillance program. It is not intended to diagnose MRSA infection nor to guide or monitor treatment for MRSA infections. Performed at [facility], [place] [address], [place],  [address]     Labs: Results for orders placed or performed during the hospital encounter of 03/30/19 (from the past 48 hour(s))  Glucose, capillary     Status: Abnormal   Collection Time: 03/30/19  5:54 PM  Result Value Ref Range   Glucose-Capillary 185 (H) 70 - 99 mg/dL  POCT I-Stat EG7     Status: Abnormal   Collection Time: 03/30/19  6:13 PM  Result Value Ref Range   pH, Ven 7.166 (LL) 7.250 - 7.430   pCO2, Ven 53.2 44.0 - 60.0 mmHg   pO2, Ven 58.0 (H) 32.0 - 45.0 mmHg   Bicarbonate 19.2 (  L) 20.0 - 28.0 mmol/L   TCO2 21 (L) 22 - 32 mmol/L   O2 Saturation 81.0 %   Acid-base deficit 10.0 (H) 0.0 - 2.0 mmol/L   Sodium 137 135 - 145 mmol/L   Potassium 5.9 (H) 3.5 - 5.1 mmol/L   Calcium, Ion 1.15 1.15 - 1.40 mmol/L   HCT 37.0 (L) 39.0 - 52.0 %   Hemoglobin 12.6 (L) 13.0 - 17.0 g/dL   Patient temperature HIDE    Sample type VENOUS   ABO/Rh     Status: None   Collection Time: 03/30/19  6:20 PM  Result Value Ref Range   ABO/RH(D)      A POS Performed at [facility], [place] [address] [place], [place] [address]   Type and screen [place]     Status: None   Collection Time: 03/30/19  6:25 PM  Result Value Ref Range   ABO/RH(D) A POS     Antibody Screen NEG    Sample Expiration      04/02/2019,2359 Performed at [facility], [place] [address] [place], [place] [address]   CBC     Status: Abnormal   Collection Time: 03/30/19  9:59 PM  Result Value Ref Range   WBC 15.7 (H) 4.0 - 10.5 K/uL   RBC 3.54 (L) 4.22 - 5.81 MIL/uL   Hemoglobin 10.9 (L) 13.0 - 17.0 g/dL   HCT 33.8 (L) 39.0 - 52.0 %   MCV 95.5 80.0 - 100.0 fL   MCH 30.8 26.0 - 34.0 pg   MCHC 32.2 30.0 - 36.0 g/dL   RDW 13.7 11.5 - 15.5 %   Platelets 133 (L) 150 - 400 K/uL   nRBC 0.0 0.0 - 0.2 %    Comment: Performed at [facility], [place] [address] [place], [place] [address]  MRSA PCR Screening     Status: None   Collection Time: 03/30/19 10:16 PM   Specimen: Nasal Mucosa; Nasopharyngeal  Result Value Ref Range   MRSA by PCR NEGATIVE NEGATIVE    Comment:        The GeneXpert MRSA Assay (FDA approved for NASAL specimens only), is one component of a comprehensive MRSA colonization surveillance program. It is not intended to diagnose MRSA infection nor to guide or monitor treatment for MRSA infections. Performed at [facility], [place] [address] [place], [place] [address]   BMET in AM     Status: Abnormal   Collection Time: 03/31/19  3:28 AM  Result Value Ref Range   Sodium 137 135 - 145 mmol/L   Potassium 5.3 (H) 3.5 - 5.1 mmol/L   Chloride 107 98 - 111 mmol/L   CO2 21 (L) 22 - 32 mmol/L   Glucose, Bld 197 (H) 70 - 99 mg/dL   BUN 33 (H) 8 - 23 mg/dL   Creatinine, Ser 1.59 (H) 0.61 - 1.24 mg/dL   Calcium 7.9 (L) 8.9 - 10.3 mg/dL   GFR calc non Af Amer 42 (L) >60 mL/min   GFR calc Af Amer 49 (L) >60 mL/min   Anion gap 9 5 - 15    Comment: Performed at [facility], [place] [address], [place],  [address]  CBC in AM     Status: Abnormal   Collection Time: 03/31/19  3:28 AM  Result Value Ref Range   WBC 11.7 (H) 4.0 - 10.5 K/uL   RBC 3.41 (L) 4.22 - 5.81 MIL/uL  Hemoglobin 10.4 (L) 13.0 - 17.0 g/dL   HCT 31.7 (L) 39.0 - 52.0 %   MCV 93.0 80.0 - 100.0 fL   MCH 30.5 26.0 - 34.0 pg   MCHC 32.8 30.0 - 36.0 g/dL   RDW 13.8 11.5 - 15.5 %   Platelets 127 (L) 150 - 400 K/uL   nRBC 0.0 0.0 - 0.2 %    Comment: Performed at [facility], [place] [address], [place], [place] [address]  Magnesium in AM     Status: None   Collection Time: 03/31/19  3:28 AM  Result Value Ref Range   Magnesium 1.8 1.7 - 2.4 mg/dL    Comment: Performed at [facility], [place] [address] [place], [place] [address]  Hemoglobin A1c     Status: Abnormal   Collection Time: 03/31/19  4:00 AM  Result Value Ref Range   Hgb A1c MFr Bld 6.4 (H) 4.8 - 5.6 %    Comment: (NOTE) Pre diabetes:          5.7%-6.4% Diabetes:              >6.4% Glycemic control for   <7.0% adults with diabetes    Mean Plasma Glucose 136.98 mg/dL    Comment: Performed at [place] [address] [place], [place] [address]  Glucose, capillary     Status: Abnormal   Collection Time: 03/31/19  8:25 AM  Result Value Ref Range   Glucose-Capillary 194 (H) 70 - 99 mg/dL  Glucose, capillary     Status: Abnormal   Collection Time: 03/31/19 11:44 AM  Result Value Ref Range   Glucose-Capillary 201 (H) 70 - 99 mg/dL  Glucose, capillary     Status: Abnormal   Collection Time: 03/31/19  4:46 PM  Result Value Ref Range   Glucose-Capillary 210 (H) 70 - 99 mg/dL  Glucose, capillary     Status: Abnormal   Collection Time: 03/31/19  9:50 PM  Result Value Ref Range   Glucose-Capillary 146 (H) 70 - 99 mg/dL  Potassium in AM     Status: Abnormal   Collection Time: 04/01/19  2:21 AM  Result Value Ref Range   Potassium 5.5 (H) 3.5 - 5.1 mmol/L    Comment: Performed at [facility], [place] [address] [place], [place] [address]  Creatinine in AM     Status: Abnormal   Collection Time: 04/01/19  2:21 AM  Result Value Ref Range   Creatinine, Ser 1.90 (H) 0.61 -  1.24 mg/dL   GFR calc non Af Amer 34 (L) >60 mL/min   GFR calc Af Amer 40 (L) >60 mL/min    Comment: Performed at [facility], [place] [address], [place], [place] [address]  Hemoglobin in AM     Status: Abnormal   Collection Time: 04/01/19  2:21 AM  Result Value Ref Range   Hemoglobin 9.1 (L) 13.0 - 17.0 g/dL    Comment: Performed at [facility], [place] [address], [place], [place] [address]    Imaging / Studies: Esophagram W/Water Sol CM  Result Date: 03/31/2019 CLINICAL DATA:  History of hernia repair. EXAM: ESOPHOGRAM/BARIUM SWALLOW TECHNIQUE: Single contrast examination was performed using  water-soluble. FLUOROSCOPY TIME:  Fluoroscopy Time:  2 minutes 18 seconds Radiation Exposure Index (if provided by the fluoroscopic device): 34 mGy Number of Acquired Spot Images: 4 COMPARISON:  CT chest from 11/16/2018 FINDINGS: The patient was placed in 40 degrees head of bed  elevated. Imaging was performed in AP and both obliques utilizing fluoroscopic loops saved and captured images. The esophagus was patulous with narrowing distally presumably at the site of fundoplication. Contrast passed into the stomach through the site of narrowing and then beyond into the proximal small bowel. Incidental note was made small bowel distension in the upper abdomen on today's study there are no signs of leak from the distal esophagus or stomach. IMPRESSION: 1. Signs of fundoplication with some narrowing of distal esophagus and esophageal dysmotility. 2. No signs leak. 3. Signs of postoperative ileus incidentally imaged. Electronically Signed   By: Zetta Bills M.D.   On: 03/31/2019 10:16    Medications / Allergies: per chart  Antibiotics: Anti-infectives (From admission, onward)   Start     Dose/Rate Route Frequency Ordered Stop   03/30/19 2025  valACYclovir (VALTREX) tablet 1,000 mg     1,000 mg Oral 2 times daily PRN 03/30/19 2026     03/30/19 1015  cefTRIAXone (ROCEPHIN) 2  g in sodium chloride 0.9 % 100 mL IVPB     2 g 200 mL/hr over 30 Minutes Intravenous On call to O.R. 03/30/19 1012 03/30/19 1236   03/30/19 1015  metroNIDAZOLE (FLAGYL) IVPB 500 mg     500 mg 100 mL/hr over 60 Minutes Intravenous On call to O.R. 03/30/19 1012 03/30/19 1215        Note: Portions of this report may have been transcribed using voice recognition software. Every effort was made to ensure accuracy; however, inadvertent computerized transcription errors may be present.   Any transcriptional errors that result from this process are unintentional.     Adin Hector, MD, FACS, MASCRS Gastrointestinal and Minimally Invasive Surgery    [address], [place], [place] [address] [phone] Main / Paging [phone] Fax

## 2019-04-01 NOTE — Progress Notes (Signed)
PT Cancellation Note  Patient Details Name: Douglas [name] MRN: [id number] DOB: July 12, 1945   Cancelled Treatment:    Reason Eval/Treat Not Completed: PT screened, no needs identified, will sign off Nursing reports no acute PT need. RN and pt reports pt ambulating twice already today (3x around unit).  Pt agreeable to continue ambulating with nursing staff. Pt and spouse reports they have RW at home as well if needed.   [name],[name] 04/01/2019, 1:35 PM Arlyce Dice, DPT Acute Rehabilitation Services Office: [phone]

## 2019-04-02 LAB — CREATININE, SERUM
Creatinine, Ser: 1.08 mg/dL (ref 0.61–1.24)
GFR calc Af Amer: >60 mL/min (ref >60)
GFR calc non Af Amer: >60 mL/min (ref >60)

## 2019-04-02 LAB — GLUCOSE, CAPILLARY
Glucose-Capillary: 158 mg/dL — ABNORMAL HIGH (ref 70–99)
Glucose-Capillary: 193 mg/dL — ABNORMAL HIGH (ref 70–99)
Glucose-Capillary: 152 mg/dL — ABNORMAL HIGH (ref 70–99)
Glucose-Capillary: 167 mg/dL — ABNORMAL HIGH (ref 70–99)

## 2019-04-02 LAB — HEMOGLOBIN: Hemoglobin: 8.9 g/dL — ABNORMAL LOW (ref 13.0–17.0)

## 2019-04-02 LAB — POTASSIUM: Potassium: 4.9 mmol/L (ref 3.5–5.1)

## 2019-04-02 NOTE — Progress Notes (Signed)
Fredi Sheahan [id number] [birth year]  CARE TEAM:  PCP: [name], MD  Outpatient Care Team: Patient Care Team: [name], MD as PCP - General (Family Medicine) [name], MD as PCP - Cardiology (Cardiology) [name], MD as Consulting Physician (General Surgery) Laurence Spates, MD as Consulting Physician (Gastroenterology) Arta Silence, MD as Consulting Physician (Gastroenterology) Rutherford Guys, MD as Consulting Physician (Ophthalmology)  Inpatient Treatment Team: Treatment Team: Attending Provider: [name], MD; Technician: Leda Quail, NT; Technician: Sue Lush, [place]; Technician: Sharren Bridge, NT; Occupational Therapist: Betsy Pries, OT; Registered Nurse: Guadlupe Spanish, RN   Problem List:   Principal Problem:   Incarcerated hiatal hernia s/p robotic repair/Toupet 03/30/2019 Active Problems:   Ischemic heart disease   Dyslipidemia   Iron deficiency anemia   Recurrent angina status post DES stent placement Jan 2020   Obstructive sleep apnea   PAF (paroxysmal atrial fibrillation) (HCC)   Ischemic cardiomyopathy   Chronic anticoagulation   Obesity (BMI 30-39.9)   Hyperglycemia   AKI (acute kidney injury) ([place])   3 Days Post-Op  03/30/2019  POST-OPERATIVE DIAGNOSIS:   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 ROBOTIC reduction of paraesophageal hiatal hernia 2. Type II mediastinal dissection. 3. RIGHT crural fascial relaxation  3. Primary repair of hiatal hernia over pledgets.  4. Anterior & posterior gastropexy. 5. Toupet (Partial posterior [id number] degree) fundoplication x 3 cm 6. Mesh reinforcement with absorbable Phasix mesh  SURGEON:  Adin Hector, MD  Assessment  Recovering  [facility] Stay = 3 days)  Plan:  ARF and elevated Cr resolved: SL IVF's  Borderline hyperkalemia.  Avoiding extra potassium.  Follow  closely.  resolving  Esophagram with no leak or obstruction, advanced to dysphagia 1 pured diet.  Calorie counts.  Teach pured diet   Nausea control.  Decadron steroids x3 days given edema and swelling.  Hypertension.  With increase creatinine & borderline low blood pressure, will hold Norvasc and cut back on Coreg with as needed backup.  Parameters to hold if hypotensive.  Hyperglycemia.  No prior documentation of diabetes.  Perhaps related to receiving IV steroids.  Placed on sliding scale for now and follow.  hemoglobin A1c 6.4  -VTE prophylaxis- SCDs, etc. resume Lovenox and follow.  Hold off on Plavix anticoagulation till hemoglobin stable x48 hours   -mobilize as tolerated to help recovery    04/02/2019    Subjective: (Chief complaint)  Sitting up in chair. Feeling SOB  Denies pain.  Feels like the mediastinal drain is tapering off  Objective:  Vital signs:  Vitals:   04/01/19 1433 04/01/19 2105 04/01/19 2259 04/02/19 0548  BP: 131/78 (!) 117/59 (!) 144/84 135/83  Pulse: 87 83 79 84  Resp: 18 15  19   Temp: 98.2 F (36.8 C) 97.8 F (36.6 C)  98 F (36.7 C)  TempSrc: Oral Oral  Oral  SpO2: 90% 97% 90% 94%  Weight:      Height:        Last BM Date: 04/01/19  Intake/Output   Yesterday:  12/11 0701 - 12/12 0700 In: 1306.6 [I.V.:399.7; IV Piggyback:906.9] Out: 1835 [id number]; Drains:460] This shift:  Total I/O In: -  Out: 300 [Urine:300]  Bowel function:  Flatus: YES  BM: Yes  Drain: Serosanguinous   Physical Exam:  General: Pt awake/alert/oriented x4 in no acute distress Eyes: PERRL, normal EOM.  Sclera clear.  No icterus Psych:  No delerium/psychosis/paranoia HENT: Normocephalic, Mucus  membranes moist.  No thrush Neck: Supple, No tracheal deviation Chest: No chest wall pain w good excursion CV:  Pulses intact.  Regular rhythm MS: Normal AROM mjr joints.  No obvious deformity  Abdomen:  Obese.   Soft.  Mildy distended.  Mildly tender  at incisions only.  No evidence of peritonitis.  No incarcerated hernias.  Ext:  No deformity.  No mjr edema.  No cyanosis Skin: No petechiae / purpura  Results:   Cultures: Recent Results (from the past 720 hour(s))  Novel Coronavirus, NAA (Hosp order, Send-out to Ref Lab; TAT 18-24 hrs     Status: None   Collection Time: 03/26/19  9:06 AM   Specimen: Nasopharyngeal Swab; Respiratory  Result Value Ref Range Status   SARS-CoV-2, NAA NOT DETECTED NOT DETECTED Final    Comment: (NOTE) This nucleic acid amplification test was developed and its performance characteristics determined by [employer]. Nucleic acid amplification tests include PCR and TMA. This test has not been FDA cleared or approved. This test has been authorized by FDA under an Emergency Use Authorization (EUA). This test is only authorized for the duration of time the declaration that circumstances exist justifying the authorization of the emergency use of in vitro diagnostic tests for detection of SARS-CoV-2 virus and/or diagnosis of COVID-19 infection under section 564(b)(1) of the Act, 21 U.S.C. [id number]) (1), unless the authorization is terminated or revoked sooner. When diagnostic testing is negative, the possibility of a false negative result should be considered in the context of a patient's recent exposures and the presence of clinical signs and symptoms consistent with COVID-19. An individual without symptoms of COVID- 19 and who is not shedding SARS-CoV-2 vi rus would expect to have a negative (not detected) result in this assay. Performed At: [facility] [address] [place], [place] [id number] Rush Farmer MD [id number]    [place]  Final    Comment: Performed at [facility], [place] [address] [place], [place] [address]  MRSA PCR Screening     Status: None   Collection Time: 03/30/19 10:16 PM   Specimen: Nasal Mucosa; Nasopharyngeal  Result  Value Ref Range Status   MRSA by PCR NEGATIVE NEGATIVE Final    Comment:        The GeneXpert MRSA Assay (FDA approved for NASAL specimens only), is one component of a comprehensive MRSA colonization surveillance program. It is not intended to diagnose MRSA infection nor to guide or monitor treatment for MRSA infections. Performed at [facility], [place] [address], [place], [place] [address]     Labs: Results for orders placed or performed during the hospital encounter of 03/30/19 (from the past 48 hour(s))  Glucose, capillary     Status: Abnormal   Collection Time: 03/31/19 11:44 AM  Result Value Ref Range   Glucose-Capillary 201 (H) 70 - 99 mg/dL  Glucose, capillary     Status: Abnormal   Collection Time: 03/31/19  4:46 PM  Result Value Ref Range   Glucose-Capillary 210 (H) 70 - 99 mg/dL  Glucose, capillary     Status: Abnormal   Collection Time: 03/31/19  9:50 PM  Result Value Ref Range   Glucose-Capillary 146 (H) 70 - 99 mg/dL  Potassium in AM     Status: Abnormal   Collection Time: 04/01/19  2:21 AM  Result Value Ref Range   Potassium 5.5 (H) 3.5 - 5.1 mmol/L    Comment: Performed at [facility], [place]  [address] [place], [place] [address]  Creatinine in AM     Status: Abnormal   Collection Time: 04/01/19  2:21 AM  Result Value Ref Range   Creatinine, Ser 1.90 (H) 0.61 - 1.24 mg/dL   GFR calc non Af Amer 34 (L) >60 mL/min   GFR calc Af Amer 40 (L) >60 mL/min    Comment: Performed at [facility], [place] [address] [place], [place] [address]  Hemoglobin in AM     Status: Abnormal   Collection Time: 04/01/19  2:21 AM  Result Value Ref Range   Hemoglobin 9.1 (L) 13.0 - 17.0 g/dL    Comment: Performed at [facility], [place] [address] [place], [place] [address]  Glucose, capillary     Status: Abnormal   Collection Time: 04/01/19  7:40 AM  Result Value Ref Range   Glucose-Capillary 165 (H) 70 - 99  mg/dL   Comment 1 QC Due   Glucose, capillary     Status: Abnormal   Collection Time: 04/01/19 11:13 AM  Result Value Ref Range   Glucose-Capillary 230 (H) 70 - 99 mg/dL  Glucose, capillary     Status: Abnormal   Collection Time: 04/01/19  4:44 PM  Result Value Ref Range   Glucose-Capillary 145 (H) 70 - 99 mg/dL  Glucose, capillary     Status: Abnormal   Collection Time: 04/01/19 10:14 PM  Result Value Ref Range   Glucose-Capillary 154 (H) 70 - 99 mg/dL  Potassium QAM x5d     Status: None   Collection Time: 04/02/19  4:34 AM  Result Value Ref Range   Potassium 4.9 3.5 - 5.1 mmol/L    Comment: Performed at [facility], [place] [address] [place], [place] [address]  Creatinine QAM x5d     Status: None   Collection Time: 04/02/19  4:34 AM  Result Value Ref Range   Creatinine, Ser 1.08 0.61 - 1.24 mg/dL   GFR calc non Af Amer >60 >60 mL/min   GFR calc Af Amer >60 >60 mL/min    Comment: Performed at [facility], [place] [address], [place], [place] [address]  Hemoglobin in AM     Status: Abnormal   Collection Time: 04/02/19  4:34 AM  Result Value Ref Range   Hemoglobin 8.9 (L) 13.0 - 17.0 g/dL    Comment: Performed at [facility], [place] [address], [place],  [address]  Glucose, capillary     Status: Abnormal   Collection Time: 04/02/19  7:22 AM  Result Value Ref Range   Glucose-Capillary 158 (H) 70 - 99 mg/dL    Imaging / Studies: No results found.  Medications / Allergies: per chart  Antibiotics: Anti-infectives (From admission, onward)   Start     Dose/Rate Route Frequency Ordered Stop   03/30/19 2025  valACYclovir (VALTREX) tablet 1,000 mg     1,000 mg Oral 2 times daily PRN 03/30/19 2026     03/30/19 1015  cefTRIAXone (ROCEPHIN) 2 g in sodium chloride 0.9 % 100 mL IVPB     2 g 200 mL/hr over 30 Minutes Intravenous On call to O.R. 03/30/19 1012 03/30/19 1236   03/30/19 1015  metroNIDAZOLE (FLAGYL) IVPB 500 mg       500 mg 100 mL/hr over 60 Minutes Intravenous On call to O.R. 03/30/19 1012 [id number]       Malikiah Debarr C Tika Hannis, MD  Col[facility] [place] [facility]

## 2019-04-02 NOTE — Evaluation (Signed)
Occupational Therapy Evaluation Patient Details Name: Douglas [name] MRN: [id number] DOB: 1946-04-08 Today's Date: 04/02/2019    History of Present Illness Incarcerated hiatal hernia s/p robotic repair/Toupet 03/30/2019   Clinical Impression   OT eval and education complete.    Pt walked in room and in hall with OT and did maintain oxygen levels over 90 with VC for taking deep breathes as well as well as taking rest breaks as needed.     Follow Up Recommendations  No OT follow up;Supervision/Assistance - 24 hour    Equipment Recommendations  None recommended by OT    Recommendations for Other Services       Precautions / Restrictions   watch oxygen levels     Mobility Bed Mobility          mod I        Transfers          mod I            Balance Overall balance assessment: Mild deficits observed, not formally tested                                         ADL either performed or assessed with clinical judgement   ADL Overall ADL's : Modified independent                                       General ADL Comments: OT session focused on maintaining oxgen sats over 90 with ADL activity .  Needed VC for deep breathes as well as not to talk but pt did maintain with no oxygen over 90.  RN notifed.  Educated pt on energy conservation and taking breaks as needed as well as deep breathing techniquies      Extremity/Trunk Assessment Upper Extremity Assessment Upper Extremity Assessment: Generalized weakness           Communication     Cognition Arousal/Alertness: Awake/alert Behavior During Therapy: WFL for tasks assessed/performed Overall Cognitive Status: Within Functional Limits for tasks assessed                                                Home Living Family/patient expects to be discharged to:: Private residence Living Arrangements: Spouse/significant other Available Help at  Discharge: Family Type of Home: House       Home Layout: One level     Bathroom Shower/Tub: Aeronautical engineer: None          Prior Functioning/Environment Level of Independence: Independent                          OT Goals(Current goals can be found in the care plan section) Acute Rehab OT Goals Patient Stated Goal: home OT Goal Formulation: With patient Time For Goal Achievement: 04/09/19 Potential to Achieve Goals: Good  OT Frequency:      AM-PAC OT "6 Clicks" Daily Activity     Outcome Measure Help from another person eating meals?: None Help from another person taking care of personal grooming?: None Help from another person toileting, which includes using toliet,  bedpan, or urinal?: None Help from another person bathing (including washing, rinsing, drying)?: None Help from another person to put on and taking off regular upper body clothing?: None Help from another person to put on and taking off regular lower body clothing?: None 6 Click Score: 24   End of Session Nurse Communication: Mobility status  Activity Tolerance: Patient tolerated treatment well Patient left: in chair                   Time: 1230-1254 OT Time Calculation (min): 24 min Charges:  OT General Charges $OT Visit: 1 Visit OT Evaluation $OT Eval High Complexity: 1 High OT Treatments $Self Care/Home Management : 8-22 mins  Kari Baars, OT Acute Rehabilitation Services [phone] Office- [place], Edwena Felty D 04/02/2019, 1:51 PM

## 2019-04-03 LAB — GLUCOSE, CAPILLARY: Glucose-Capillary: 144 mg/dL — ABNORMAL HIGH (ref 70–99)

## 2019-04-03 LAB — CREATININE, SERUM
Creatinine, Ser: 0.81 mg/dL (ref 0.61–1.24)
GFR calc Af Amer: >60 mL/min (ref >60)
GFR calc non Af Amer: >60 mL/min (ref >60)

## 2019-04-03 LAB — POTASSIUM: Potassium: 4.6 mmol/L (ref 3.5–5.1)

## 2019-04-03 NOTE — Discharge Summary (Signed)
Physician Discharge Summary  Douglas [name] [id number] DOB: March 07, 1946 DOA: 03/30/2019  PCP: [name], MD  Admit date: 03/30/2019 Discharge date: 04/03/2019  Recommendations for Outpatient Follow-up:  1.  (include homehealth, outpatient follow-up instructions, specific recommendations for PCP to follow-up on, etc.)  Follow-up Information    [name], MD. Schedule an appointment as soon as possible for a visit in 3 weeks.   Specialty: General Surgery Why: To follow up after your operation, To follow up after your hospital stay Contact information: [place] [address] [phone]          Discharge Diagnoses:  Principal Problem:   Incarcerated hiatal hernia s/p robotic repair/Toupet 03/30/2019 Active Problems:   Ischemic heart disease   Dyslipidemia   Iron deficiency anemia   Recurrent angina status post DES stent placement Jan 2020   Obstructive sleep apnea   PAF (paroxysmal atrial fibrillation) (HCC)   Ischemic cardiomyopathy   Chronic anticoagulation   Obesity (BMI 30-39.9)   Hyperglycemia   AKI (acute kidney injury) [facility])   Surgical Procedure: lap hiatal hernia repair with mesh  Discharge Condition: Good Disposition: Home  Diet recommendation: pureed/liquid diet   Hospital Course:  73 yo male underwent robotic hiatal hernia repair with mesh. Post op he had some trouble swallowing, he was put on steroids. He had oxygen dependence likely 2/2 to fluid retention which resolved with time. He ambulated well. Today he tolerated a dysphagia diet and the coughing spells after eating have resolved.  Discharge Instructions  Discharge Instructions    Call MD for:   Complete by: As directed    Temperature > 101.33F   Call MD for:  extreme fatigue   Complete by: As directed    Call MD for:  hives   Complete by: As directed    Call MD for:  persistant nausea and vomiting   Complete by: As directed    Call MD  for:  redness, tenderness, or signs of infection (pain, swelling, redness, odor or green/yellow discharge around incision site)   Complete by: As directed    Call MD for:  severe uncontrolled pain   Complete by: As directed    Diet general   Complete by: As directed    SEE ESOPHAGEAL SURGERY DIET INSTRUCTIONS  We using usually start you out on a pureed (blenderized) diet. Expect some sticking with swallowing over the next 1-2 months.   This is due to swelling around your esophagus at the wrap & hiatal diaphragm repair.  It will gradually ease off over the next few months.   Discharge instructions   Complete by: As directed    Please see discharge instruction sheets.   Also refer to any handouts/printouts that may have been given from the [facility] surgery office (if you visited [place] there before surgery) Please call our office if you have any questions or concerns [phone]   Driving Restrictions   Complete by: As directed    No driving until off narcotics and can safely swerve away without pain during an emergency   Increase activity slowly   Complete by: As directed    Lifting restrictions   Complete by: As directed    Avoid heavy lifting initially, <20 pounds at first.   Do not push through pain.   You have no specific weight limit: If it hurts to do, DON'T DO IT.    If you feel no pain, you are not injuring anything.  Pain will  protect you from injury.   Coughing and sneezing are far more stressful to your incision than any lifting.   Avoid resuming heavy lifting (>50 pounds) or other intense activity until off all narcotic pain medications.   When want to exercise more, give yourself 2 weeks to gradually get back to full intense exercise/activity.   May shower / Bathe   Complete by: As directed    [facility].  It is fine for dressings or wounds to be washed/rinsed.  Use gentle soap & water.  This will help the incisions and/or wounds get clean & minimize infection.   May walk  up steps   Complete by: As directed    Remove dressing in 72 hours   Complete by: As directed    You have closed incisions: Shower and bathe over these incisions with soap and water every day.  It is OK to wash over the dressings: they are waterproof. Remove all surgical dressings on postoperative day #3.  You do not need to replace dressings over the closed incisions unless you feel more comfortable with a Band-Aid covering it.   Please call our office [phone] if you have further questions.   Sexual Activity Restrictions   Complete by: As directed    Sexual activity as tolerated.  Do not push through pain.  Pain will protect you from injury.   Walk with assistance   Complete by: As directed    Walk over an hour a day.  May use a walker/cane/companion to help with balance and stamina.     Allergies as of 04/03/2019      Reactions   Ace Inhibitors Cough   Lisinopril Cough   Losartan Cough      Medication List    TAKE these medications   acetaminophen 500 MG tablet Commonly known as: TYLENOL Take 500-1,000 mg by mouth every 6 (six) hours as needed (for pain.).   amLODipine 10 MG tablet Commonly known as: NORVASC Take 0.5 tablets (5 mg total) by mouth daily.   aspirin 81 MG EC tablet Take 81 mg by mouth daily.   carvedilol 12.5 MG tablet Commonly known as: COREG TAKE 1 TABLET BY MOUTH TWICE What changed:   how much to take  how to take this  when to take this  additional instructions   clopidogrel 75 MG tablet Commonly known as: PLAVIX TAKE 1 TABLET BY MOUTH DAILY   colchicine 0.6 MG tablet Take 0.6 mg by mouth 2 (two) times daily as needed (gout). Colcrys   fexofenadine 180 MG tablet Commonly known as: ALLEGRA Take 180 mg by mouth daily as needed for allergies.   furosemide 40 MG tablet Commonly known as: LASIX Take 1 tablet (40 mg total) by mouth daily as needed for fluid or edema.   nitroGLYCERIN 0.4 MG SL tablet Commonly known as:  NITROSTAT Place 0.4 mg under the tongue every 5 (five) minutes as needed for chest pain.   ondansetron 4 MG tablet Commonly known as: ZOFRAN Take 1 tablet (4 mg total) by mouth every 8 (eight) hours as needed for nausea or vomiting.   promethazine 25 MG suppository Commonly known as: PHENERGAN Place 1 suppository (25 mg total) rectally every 6 (six) hours as needed for nausea.   simvastatin 20 MG tablet Commonly known as: ZOCOR TAKE 1 TABLET BY MOUTH EVERYDAY AT BEDTIME What changed: See the new instructions.   traMADol 50 MG tablet Commonly known as: ULTRAM Take 1-2 tablets (50-100 mg total) by mouth  every 6 (six) hours as needed for moderate pain or severe pain.   valACYclovir 1000 MG tablet Commonly known as: VALTREX Take 1,000 mg by mouth 2 (two) times daily as needed (fever blisters).   Vitamin D 50 MCG (2000 UT) tablet Take 2,000 Units by mouth daily.      Follow-up Information    [name], MD. Schedule an appointment as soon as possible for a visit in 3 weeks.   Specialty: General Surgery Why: To follow up after your operation, To follow up after your hospital stay Contact information: [place] [address] [phone]            The results of significant diagnostics from this hospitalization (including imaging, microbiology, ancillary and laboratory) are listed below for reference.    Significant Diagnostic Studies: Esophagram W/Water Sol CM  Result Date: 03/31/2019 CLINICAL DATA:  History of hernia repair. EXAM: ESOPHOGRAM/BARIUM SWALLOW TECHNIQUE: Single contrast examination was performed using  water-soluble. FLUOROSCOPY TIME:  Fluoroscopy Time:  2 minutes 18 seconds Radiation Exposure Index (if provided by the fluoroscopic device): 34 mGy Number of Acquired Spot Images: 4 COMPARISON:  CT chest from 11/16/2018 FINDINGS: The patient was placed in 40 degrees head of bed elevated. Imaging was performed in AP and both obliques  utilizing fluoroscopic loops saved and captured images. The esophagus was patulous with narrowing distally presumably at the site of fundoplication. Contrast passed into the stomach through the site of narrowing and then beyond into the proximal small bowel. Incidental note was made small bowel distension in the upper abdomen on today's study there are no signs of leak from the distal esophagus or stomach. IMPRESSION: 1. Signs of fundoplication with some narrowing of distal esophagus and esophageal dysmotility. 2. No signs leak. 3. Signs of postoperative ileus incidentally imaged. Electronically Signed   By: Zetta Bills M.D.   On: 03/31/2019 10:16    Labs: Basic Metabolic Panel: Recent Labs  Lab 03/28/19 0934 03/30/19 1813 03/31/19 0328 04/01/19 0221 04/02/19 0434 04/03/19 0444  NA 138 137 137  --   --   --   K 4.5 5.9* 5.3* 5.5* 4.9 4.6  CL 104  --  107  --   --   --   CO2 24  --  21*  --   --   --   GLUCOSE 138*  --  197*  --   --   --   BUN 21  --  33*  --   --   --   CREATININE 0.81  --  1.59* 1.90* 1.08 0.81  CALCIUM 8.9  --  7.9*  --   --   --   MG  --   --  1.8  --   --   --    Liver Function Tests: No results for input(s): AST, ALT, ALKPHOS, BILITOT, PROT, ALBUMIN in the last 168 hours.  CBC: Recent Labs  Lab 03/28/19 0934 03/30/19 1813 03/30/19 2159 03/31/19 0328 04/01/19 0221 04/02/19 0434  WBC 5.4  --  15.7* 11.7*  --   --   HGB 15.6 12.6* 10.9* 10.4* 9.1* 8.9*  HCT 47.7 37.0* 33.8* 31.7*  --   --   MCV 91.7  --  95.5 93.0  --   --   PLT 181  --  133* 127*  --   --     CBG: Recent Labs  Lab 04/02/19 0722 04/02/19 1138 04/02/19 1628 04/02/19 2143 04/03/19 0806  GLUCAP  158* 7* 152* 167* 144*    Principal Problem:   Incarcerated hiatal hernia s/p robotic repair/Toupet 03/30/2019 Active Problems:   Ischemic heart disease   Dyslipidemia   Iron deficiency anemia   Recurrent angina status post DES stent placement Jan 2020   Obstructive sleep  apnea   PAF (paroxysmal atrial fibrillation) (HCC)   Ischemic cardiomyopathy   Chronic anticoagulation   Obesity (BMI 30-39.9)   Hyperglycemia   AKI (acute kidney injury) ([place])   Time coordinating discharge: 15 min

## 2019-04-05 ENCOUNTER — Other Ambulatory Visit: Payer: Self-pay | Admitting: *Deleted

## 2019-04-05 MED ORDER — FUROSEMIDE 40 MG PO TABS: 40.0000 mg | ORAL_TABLET | Freq: Every day | ORAL | 5 refills | Status: DC | PRN

## 2019-04-06 ENCOUNTER — Telehealth: Payer: Self-pay | Admitting: Cardiovascular Disease

## 2019-04-06 NOTE — Telephone Encounter (Signed)
Pts wife called to report that pt had hiatal hernia repair 03/30/19 and he has started to have some bilateral ankle edema.. she is asking if he can take the prn Lasix 40.. she has already been okay'd by the surgeons office to take it with his drainage tube.. she also says he is a little SOB but he was like that prior to surgery due to the hernia..she says it is not worse.  He is comfortable at rest.   I advised her to go ahead and use the previously prescribed Lasix today and tomorrow..toelevate his legs when sitting,,, she reports he has had increased NA in his post op diet.. I have advised her to watch his NA intake... to call by the end pf the week if not improved and I will also forward to Dr. Oval Linsey for review.

## 2019-04-06 NOTE — Telephone Encounter (Signed)
New Message   Pts wife is calling and says she has questions about the Lasix. She says he had recently had surgery and he is having some swelling around his ankles    Please call

## 2019-04-07 NOTE — Telephone Encounter (Signed)
Agree with these recommendations.

## 2019-05-11 ENCOUNTER — Ambulatory Visit: Payer: Medicare Other | Attending: Internal Medicine

## 2019-05-11 DIAGNOSIS — Z23 Encounter for immunization: Secondary | ICD-10-CM | POA: Insufficient documentation

## 2019-05-11 NOTE — Progress Notes (Signed)
   Covid-19 Vaccination Clinic  Name:  Douglas [name]    MRN: [id number] DOB: 08-12-1945  05/11/2019  Douglas [name] was observed post Covid-19 immunization for 15 minutes without incidence. He was provided with Vaccine Information Sheet and instruction to access the V-Safe system.   Douglas [name] was instructed to call 911 with any severe reactions post vaccine: Marland Kitchen Difficulty breathing  . Swelling of your face and throat  . A fast heartbeat  . A bad rash all over your body  . Dizziness and weakness    Immunizations Administered    Name Date Dose VIS Date Route   Pfizer COVID-19 Vaccine 05/11/2019  6:42 PM 0.3 mL 04/01/2019 Intramuscular   Manufacturer: [place]   Lot: [id number]   [place]: [id number]

## 2019-05-23 ENCOUNTER — Other Ambulatory Visit: Payer: Self-pay | Admitting: Cardiovascular Disease

## 2019-05-23 DIAGNOSIS — I119 Hypertensive heart disease without heart failure: Secondary | ICD-10-CM

## 2019-06-01 ENCOUNTER — Ambulatory Visit: Payer: Medicare Other | Attending: Internal Medicine

## 2019-06-01 DIAGNOSIS — Z23 Encounter for immunization: Secondary | ICD-10-CM | POA: Insufficient documentation

## 2019-06-01 NOTE — Progress Notes (Signed)
   Covid-19 Vaccination Clinic  Name:  Douglas [name]    MRN: [id number] DOB: April 20, 1946  06/01/2019  Douglas [name] was observed post Covid-19 immunization for 15 minutes without incidence. He was provided with Vaccine Information Sheet and instruction to access the V-Safe system.   Douglas [name] was instructed to call 911 with any severe reactions post vaccine: Marland Kitchen Difficulty breathing  . Swelling of your face and throat  . A fast heartbeat  . A bad rash all over your body  . Dizziness and weakness    Immunizations Administered    Name Date Dose VIS Date Route   Pfizer COVID-19 Vaccine 06/01/2019 10:25 AM 0.3 mL 04/01/2019 Intramuscular   Manufacturer: [place]   Lot: [id number]   [place]: [id number]

## 2019-07-14 ENCOUNTER — Other Ambulatory Visit: Payer: Self-pay

## 2019-07-14 ENCOUNTER — Encounter: Payer: Self-pay | Admitting: Cardiovascular Disease

## 2019-07-14 ENCOUNTER — Ambulatory Visit: Payer: Medicare Other | Admitting: Cardiovascular Disease

## 2019-07-14 VITALS — BP 120/80 | HR 52 | Ht 67.0 in | Wt 181.0 lb

## 2019-07-14 DIAGNOSIS — I480 Paroxysmal atrial fibrillation: Secondary | ICD-10-CM

## 2019-07-14 DIAGNOSIS — E785 Hyperlipidemia, unspecified: Secondary | ICD-10-CM | POA: Diagnosis not present

## 2019-07-14 DIAGNOSIS — Z7901 Long term (current) use of anticoagulants: Secondary | ICD-10-CM | POA: Diagnosis not present

## 2019-07-14 DIAGNOSIS — I472 Ventricular tachycardia: Secondary | ICD-10-CM | POA: Diagnosis not present

## 2019-07-14 DIAGNOSIS — I4729 Other ventricular tachycardia: Secondary | ICD-10-CM

## 2019-07-14 DIAGNOSIS — I255 Ischemic cardiomyopathy: Secondary | ICD-10-CM

## 2019-07-14 NOTE — Patient Instructions (Signed)
Medication Instructions:  Your physician recommends that you continue on your current medications as directed. Please refer to the Current Medication list given to you today.  *If you need a refill on your cardiac medications before your next appointment, please call your pharmacy*  Lab Work: NONE  Testing/Procedures: NONE   Follow-Up: At [facility], you and your health needs are our priority.  As part of our continuing mission to provide you with exceptional heart care, we have created designated Provider Care Teams.  These Care Teams include your primary Cardiologist (physician) and Advanced Practice Providers (APPs -  Physician Assistants and Nurse Practitioners) who all work together to provide you with the care you need, when you need it.  We recommend signing up for the patient portal called "MyChart".  Sign up information is provided on this After Visit Summary.  MyChart is used to connect with patients for Virtual Visits (Telemedicine).  Patients are able to view lab/test results, encounter notes, upcoming appointments, etc.  Non-urgent messages can be sent to your provider as well.   To learn more about what you can do with MyChart, go to [email or web address].    Your next appointment:   6 month(s) You will receive a reminder letter in the mail two months in advance. If you don't receive a letter, please call our office to schedule the follow-up appointment.  The format for your next appointment:   Either In Person or Virtual  Provider:   You may see [name], MD or one of the following Advanced Practice Providers on your designated Care Team:    Kerin Ransom, PA-C  [place], [place]  Coletta Memos, [place]
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
## 2019-09-06 ENCOUNTER — Encounter: Payer: Self-pay | Admitting: Internal Medicine

## 2019-09-06 ENCOUNTER — Ambulatory Visit: Payer: Medicare Other | Admitting: Internal Medicine

## 2019-09-06 ENCOUNTER — Other Ambulatory Visit: Payer: Self-pay

## 2019-09-06 DIAGNOSIS — G4733 Obstructive sleep apnea (adult) (pediatric): Secondary | ICD-10-CM

## 2019-09-06 DIAGNOSIS — I259 Chronic ischemic heart disease, unspecified: Secondary | ICD-10-CM

## 2019-09-06 NOTE — Progress Notes (Signed)
HPI male former smoker followed for OSA, complicated by CAD/stent, HBP, GERD NPSG 01/18/13 AHI  22/hr, moderate OSA, weight  195 lbs  ---------------------------------------------------------------------------.   09/09/2018- 74 year old male former smoker followed for OSA, complicated by CAD/stent, PAFib, HBP, GERD/ HH CPAP 12/ [facility] -----OSA on CPAP; DME: [facility], pt states he's using CPAP every night, no issues w/ pressure settings or mask; no new supplies needed at this time Body weight today 199 lbs Couldn't download card today. Machine is 75 years old. Had cardiac cath/ stent- no MI  09/06/19- 74 year old male former smoker followed for OSA, complicated by CAD/stent, PAFib, HBP, GERD/ HH,  CPAP auto 5-15/ [employer] Download compliance 100%, AHI 0.5/ hr Body weight today 187 lbs Tolerated difficult robotic repair of large hiatal hernia, with O2 discontinued before discharge. Breathing better now. CPAP "aggravating" but he knows it helps and is doing well.  CT chest 11/16/18- IMPRESSION: 1. Large hiatal hernia which contains the entire stomach, mildly increased in size since previous study. No evidence of gastric volvulus or other complication. 2. Cholelithiasis. No radiographic evidence of cholecystitis. 3. Colonic diverticulosis. Aortic Atherosclerosis (ICD10-I70.0). Coronary artery atherosclerosis.   ROS-see HPI   + = positive Constitutional:   No-   weight loss, night sweats, fevers, chills, fatigue, lassitude. HEENT:   No-  headaches, difficulty swallowing, tooth/dental problems, sore throat,       No-  sneezing, itching, ear ache, nasal congestion, post nasal drip,  CV:  No-   chest pain, orthopnea, PND, swelling in lower extremities, anasarca, dizziness, palpitations Resp: No-   shortness of breath with exertion or at rest.              No-   productive cough,  No non-productive cough,  No- coughing up of blood.              No-   change in color of mucus.  No- wheezing.   Skin:  No-   rash or lesions. GI:  No-   heartburn, indigestion, abdominal pain, nausea, vomiting,  GU:  MS:  No-   joint pain or swelling.   Neuro-     nothing unusual Psych:  No- change in mood or affect. No depression or anxiety.  No memory loss.  OBJ- Physical Exam   stable baseline exam General- Alert, Oriented, Affect-appropriate, Distress- none acute. +Overweight Skin- rash-none, lesions- none, excoriation- none Lymphadenopathy- none Head- atraumatic            Eyes- Gross vision intact, PERRLA, conjunctivae and secretions clear            Ears- Hearing, canals-normal            Nose- Clear, no-Septal dev, mucus, polyps, erosion, perforation             Throat- Mallampati II-III , mucosa clear , drainage- none, tonsils- atrophic Neck- flexible , trachea midline, no stridor , thyroid nl, carotid no bruit Chest - symmetrical excursion , unlabored           Heart/CV- RRR at this exam , no murmur , no gallop  , no rub, nl s1 s2                           - JVD- none , edema- none, stasis changes- none, varices- none           Lung- clear to P&A, wheeze- none, cough- none , dullness-none, rub- none  Chest wall-  Abd-  Br/ Gen/ Rectal- Not done, not indicated Extrem- cyanosis- none, clubbing, none, atrophy- none, strength- nl Neuro- grossly intact to observation

## 2019-09-06 NOTE — Assessment & Plan Note (Signed)
Benefits from CPAP with good compliance and control Plan- continue auto 5-15 

## 2019-09-06 NOTE — Assessment & Plan Note (Signed)
He denies recent cardiac events Cardiology continues to follow

## 2019-09-06 NOTE — Patient Instructions (Signed)
We can continue CPAP auto 5-15, mask of choice, humidifier, supplies, AirView/ card  Please call if we can help

## 2019-11-15 ENCOUNTER — Other Ambulatory Visit: Payer: Self-pay | Admitting: *Deleted

## 2019-11-15 DIAGNOSIS — I119 Hypertensive heart disease without heart failure: Secondary | ICD-10-CM

## 2019-11-15 MED ORDER — AMLODIPINE BESYLATE 10 MG PO TABS: 5.0000 mg | ORAL_TABLET | Freq: Every day | ORAL | 2 refills | Status: DC

## 2019-11-15 NOTE — Telephone Encounter (Signed)
Rx has been sent to the pharmacy electronically. ° °

## 2020-01-11 ENCOUNTER — Encounter: Payer: Self-pay | Admitting: Cardiovascular Disease

## 2020-01-11 ENCOUNTER — Ambulatory Visit: Payer: Medicare Other | Admitting: Cardiovascular Disease

## 2020-01-11 ENCOUNTER — Other Ambulatory Visit: Payer: Self-pay

## 2020-01-11 VITALS — BP 108/78 | HR 56 | Ht 67.0 in | Wt 193.4 lb

## 2020-01-11 DIAGNOSIS — I119 Hypertensive heart disease without heart failure: Secondary | ICD-10-CM

## 2020-01-11 DIAGNOSIS — I5033 Acute on chronic diastolic (congestive) heart failure: Secondary | ICD-10-CM

## 2020-01-11 DIAGNOSIS — G4733 Obstructive sleep apnea (adult) (pediatric): Secondary | ICD-10-CM | POA: Diagnosis not present

## 2020-01-11 DIAGNOSIS — E785 Hyperlipidemia, unspecified: Secondary | ICD-10-CM

## 2020-01-11 DIAGNOSIS — I5042 Chronic combined systolic (congestive) and diastolic (congestive) heart failure: Secondary | ICD-10-CM

## 2020-01-11 DIAGNOSIS — I480 Paroxysmal atrial fibrillation: Secondary | ICD-10-CM | POA: Diagnosis not present

## 2020-01-11 DIAGNOSIS — I10 Essential (primary) hypertension: Secondary | ICD-10-CM | POA: Insufficient documentation

## 2020-01-11 HISTORY — DX: Chronic combined systolic (congestive) and diastolic (congestive) heart failure: I50.42

## 2020-01-11 HISTORY — DX: Acute on chronic diastolic (congestive) heart failure: I50.33

## 2020-01-11 HISTORY — DX: Essential (primary) hypertension: I10

## 2020-01-11 MED ORDER — AMLODIPINE BESYLATE 5 MG PO TABS: 5.0000 mg | ORAL_TABLET | Freq: Every day | ORAL | 3 refills | Status: DC

## 2020-01-11 NOTE — Patient Instructions (Signed)
Medication Instructions:  CONTINUE THE AMLODIPINE 5 MG DAILY. IF YOUR TOP BLOOD PRESSURE NUMBER IS RUNNING BELOW 100 TAKE 1/2 TABLET   *If you need a refill on your cardiac medications before your next appointment, please call your pharmacy  Lab Work: NONE   Testing/Procedures: NONE  Follow-Up: At [facility], you and your health needs are our priority.  As part of our continuing mission to provide you with exceptional heart care, we have created designated Provider Care Teams.  These Care Teams include your primary Cardiologist (physician) and Advanced Practice Providers (APPs -  Physician Assistants and Nurse Practitioners) who all work together to provide you with the care you need, when you need it.  We recommend signing up for the patient portal called "MyChart".  Sign up information is provided on this After Visit Summary.  MyChart is used to connect with patients for Virtual Visits (Telemedicine).  Patients are able to view lab/test results, encounter notes, upcoming appointments, etc.  Non-urgent messages can be sent to your provider as well.   To learn more about what you can do with MyChart, go to [email or web address].    Your next appointment:   6 month(s)  The format for your next appointment:   In Person  Provider:   You may see [name], MD or one of the following Advanced Practice Providers on your designated Care Team:    Kerin Ransom, PA-C  [place], [place]  Coletta Memos, [place]
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
## 2020-02-04 ENCOUNTER — Encounter: Payer: Self-pay | Admitting: Emergency Medicine

## 2020-02-04 ENCOUNTER — Ambulatory Visit
Admission: EM | Admit: 2020-02-04 | Discharge: 2020-02-04 | Disposition: A | Discharge status: Home / Self Care | Payer: Medicare Other | Attending: Physician Assistant | Admitting: Physician Assistant

## 2020-02-04 ENCOUNTER — Other Ambulatory Visit: Payer: Self-pay

## 2020-02-04 DIAGNOSIS — W260XXA Contact with knife, initial encounter: Secondary | ICD-10-CM | POA: Diagnosis not present

## 2020-02-04 DIAGNOSIS — S61217A Laceration without foreign body of left little finger without damage to nail, initial encounter: Secondary | ICD-10-CM | POA: Diagnosis not present

## 2020-02-04 NOTE — Discharge Instructions (Signed)
3 sutures placed. You can remove current dressing in 24 hours. Keep wound clean and dry. You can clean gently with soap and water. Do not soak area in water. Monitor for spreading redness, increased warmth, increased swelling, fever, follow up for reevaluation needed. Otherwise follow up in 7 days for suture removal.

## 2020-02-04 NOTE — ED Triage Notes (Signed)
PT cut left fifth finger when removing limbs from a tree today.
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
## 2020-07-05 ENCOUNTER — Other Ambulatory Visit: Payer: Self-pay

## 2020-07-05 ENCOUNTER — Encounter: Payer: Self-pay | Admitting: Cardiovascular Disease

## 2020-07-05 ENCOUNTER — Other Ambulatory Visit: Payer: Self-pay | Admitting: Cardiovascular Disease

## 2020-07-05 ENCOUNTER — Ambulatory Visit: Payer: Medicare Other | Admitting: Cardiovascular Disease

## 2020-07-05 VITALS — BP 130/89 | Ht 67.0 in | Wt 201.6 lb

## 2020-07-05 DIAGNOSIS — I480 Paroxysmal atrial fibrillation: Secondary | ICD-10-CM

## 2020-07-05 DIAGNOSIS — Z01812 Encounter for preprocedural laboratory examination: Secondary | ICD-10-CM

## 2020-07-05 DIAGNOSIS — I5033 Acute on chronic diastolic (congestive) heart failure: Secondary | ICD-10-CM | POA: Diagnosis not present

## 2020-07-05 DIAGNOSIS — I10 Essential (primary) hypertension: Secondary | ICD-10-CM

## 2020-07-05 MED ORDER — APIXABAN 5 MG PO TABS: 5.0000 mg | ORAL_TABLET | Freq: Two times a day (BID) | ORAL | 5 refills | Status: AC

## 2020-07-05 NOTE — Patient Instructions (Addendum)
Medication Instructions:  START ELIQUIS 5 MG TWICE A DAY   STOP ASPIRIN   TAKE YOUR FUROSEMIDE (LASIX) DAILY, HOLD MORNING OF PROCEDURE AND AFTER THAT JUST USE AS NEEDED FOR SWELLING   *If you need a refill on your cardiac medications before your next appointment, please call your pharmacy*  Lab Work: BMET/CBC TODAY   COVID SCREENING Monday 07/09/2020 AT 11:45 AM   If you have labs (blood work) drawn today and your tests are completely normal, you will receive your results only by: Marland Kitchen MyChart Message (if you have MyChart) OR . A paper copy in the mail If you have any lab test that is abnormal or we need to change your treatment, we will call you to review the results.  Testing/Procedures: TEE/CARDIOVERSION   Follow-Up: At [facility], you and your health needs are our priority.  As part of our continuing mission to provide you with exceptional heart care, we have created designated Provider Care Teams.  These Care Teams include your primary Cardiologist (physician) and Advanced Practice Providers (APPs -  Physician Assistants and Nurse Practitioners) who all work together to provide you with the care you need, when you need it.  We recommend signing up for the patient portal called "MyChart".  Sign up information is provided on this After Visit Summary.  MyChart is used to connect with patients for Virtual Visits (Telemedicine).  Patients are able to view lab/test results, encounter notes, upcoming appointments, etc.  Non-urgent messages can be sent to your provider as well.   To learn more about what you can do with MyChart, go to [email or web address].    Your next appointment:   1 month(s)  The format for your next appointment:   In Person  Provider:   You may see [name], MD or one of the following Advanced Practice Providers on your designated Care Team:    [place], [place]  Coletta Memos, FNP  Other Instructions   You are scheduled for a  TEE/Cardioversion on 07/12/2020 with Dr. Gardiner Rhyme.  Please arrive at the [facility] (Main Entrance A) at [facility]: [address] [place], [place] [address] at 7:00 am. (1 hour prior to procedure unless lab work is needed; if lab work is needed arrive 1.5 hours ahead)  DIET: Nothing to eat or drink after midnight except a sip of water with medications (see medication instructions below)  Medication Instructions: Hold FUROSEMIDE  Continue your anticoagulant: ELIQUIS   You will need to continue your anticoagulant after  your procedure until you are told by your  Provider that it is safe to stop  Labs:  [place] 07/09/2020 AT 11:45 AM   You must have a responsible person to drive you home and stay in the waiting area during your procedure. Failure to do so could result in cancellation.  Bring your insurance cards.  *Special Note: Every effort is made to have your procedure done on time. Occasionally there are emergencies that occur at the hospital that may cause delays. Please be patient if a delay does occur.
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
## 2020-07-06 LAB — BASIC METABOLIC PANEL
BUN/Creatinine Ratio: 17 (ref 10–24)
BUN: 16 mg/dL (ref 8–27)
CO2: 19 mmol/L — ABNORMAL LOW (ref 20–29)
Calcium: 8.8 mg/dL (ref 8.6–10.2)
Chloride: 97 mmol/L (ref 96–106)
Creatinine, Ser: 0.93 mg/dL (ref 0.76–1.27)
Glucose: 85 mg/dL (ref 65–99)
Potassium: 4.6 mmol/L (ref 3.5–5.2)
Sodium: 135 mmol/L (ref 134–144)
eGFR: 86 mL/min/{1.73_m2} (ref >59)

## 2020-07-06 LAB — CBC WITH DIFFERENTIAL/PLATELET
Basophils Absolute: 0 10*3/uL (ref 0.0–0.2)
Basos: 1 %
EOS (ABSOLUTE): 0.1 10*3/uL (ref 0.0–0.4)
Eos: 1 %
Hematocrit: 48.1 % (ref 37.5–51.0)
Hemoglobin: 15.6 g/dL (ref 13.0–17.7)
Immature Grans (Abs): 0 10*3/uL (ref 0.0–0.1)
Immature Granulocytes: 0 %
Lymphocytes Absolute: 0.7 10*3/uL (ref 0.7–3.1)
Lymphs: 8 %
MCH: 28.7 pg (ref 26.6–33.0)
MCHC: 32.4 g/dL (ref 31.5–35.7)
MCV: 89 fL (ref 79–97)
Monocytes Absolute: 1.4 10*3/uL — ABNORMAL HIGH (ref 0.1–0.9)
Monocytes: 16 %
Neutrophils Absolute: 6.5 10*3/uL (ref 1.4–7.0)
Neutrophils: 74 %
Platelets: 258 10*3/uL (ref 150–450)
RBC: 5.43 x10E6/uL (ref 4.14–5.80)
RDW: 14.2 % (ref 11.6–15.4)
WBC: 8.8 10*3/uL (ref 3.4–10.8)

## 2020-07-09 ENCOUNTER — Other Ambulatory Visit (HOSPITAL_COMMUNITY)
Admission: RE | Admit: 2020-07-09 | Discharge: 2020-07-09 | Disposition: A | Discharge status: Home / Self Care | Payer: Medicare Other | Source: Ambulatory Visit | Attending: Diagnostic Radiology | Admitting: Diagnostic Radiology

## 2020-07-09 DIAGNOSIS — Z20822 Contact with and (suspected) exposure to covid-19: Secondary | ICD-10-CM | POA: Insufficient documentation

## 2020-07-09 DIAGNOSIS — Z01812 Encounter for preprocedural laboratory examination: Secondary | ICD-10-CM | POA: Insufficient documentation

## 2020-07-09 LAB — SARS CORONAVIRUS 2 (TAT 6-24 HRS): SARS Coronavirus 2: NEGATIVE

## 2020-07-11 ENCOUNTER — Other Ambulatory Visit: Payer: Self-pay | Admitting: *Deleted

## 2020-07-11 DIAGNOSIS — I480 Paroxysmal atrial fibrillation: Secondary | ICD-10-CM

## 2020-07-11 NOTE — Anesthesia Preprocedure Evaluation (Addendum)
Anesthesia Evaluation  Patient identified by MRN, date of birth, ID band Patient awake    Reviewed: Allergy & Precautions, NPO status , Patient's Chart, lab work & pertinent test results  Airway Mallampati: I  TM Distance: >3 FB Neck ROM: Full    Dental no notable dental hx.    Pulmonary sleep apnea and Continuous Positive Airway Pressure Ventilation , former smoker,    Pulmonary exam normal breath sounds clear to auscultation       Cardiovascular hypertension, + angina + CAD, + Past MI and + Cardiac Stents (DES 2007)  Normal cardiovascular exam Rhythm:Regular Rate:Normal     Neuro/Psych    GI/Hepatic GERD  ,S/p paraesophageal hernia repair   Endo/Other  negative endocrine ROS  Renal/GU      Musculoskeletal  (+) Arthritis ,   Abdominal   Peds  Hematology negative hematology ROS (+)   Anesthesia Other Findings   Reproductive/Obstetrics                            Anesthesia Physical Anesthesia Plan  ASA: III  Anesthesia Plan: General and MAC   Post-op Pain Management:    Induction: Intravenous  PONV Risk Score and Plan: 2 and Propofol infusion, TIVA and Treatment may vary due to age or medical condition  Airway Management Planned: Natural Airway  Additional Equipment: None  Intra-op Plan:   Post-operative Plan:   Informed Consent: I have reviewed the patients History and Physical, chart, labs and discussed the procedure including the risks, benefits and alternatives for the proposed anesthesia with the patient or authorized representative who has indicated his/her understanding and acceptance.       Plan Discussed with: Anesthesiologist and CRNA  Anesthesia Plan Comments:        Anesthesia Quick Evaluation

## 2020-07-12 ENCOUNTER — Other Ambulatory Visit: Payer: Self-pay

## 2020-07-12 ENCOUNTER — Ambulatory Visit (HOSPITAL_COMMUNITY)
Admission: RE | Admit: 2020-07-12 | Discharge: 2020-07-12 | Disposition: A | Discharge status: Home / Self Care | Payer: Medicare Other | Attending: Cardiology | Admitting: Cardiology

## 2020-07-12 ENCOUNTER — Encounter (HOSPITAL_COMMUNITY)
Admission: RE | Disposition: A | Discharge status: Home / Self Care | Payer: Self-pay | Source: Home / Self Care | Attending: Cardiology

## 2020-07-12 ENCOUNTER — Encounter (HOSPITAL_COMMUNITY): Payer: Self-pay | Admitting: Cardiology

## 2020-07-12 ENCOUNTER — Ambulatory Visit (HOSPITAL_BASED_OUTPATIENT_CLINIC_OR_DEPARTMENT_OTHER): Payer: Medicare Other

## 2020-07-12 ENCOUNTER — Ambulatory Visit (HOSPITAL_COMMUNITY): Payer: Medicare Other | Admitting: Certified Registered Nurse Anesthetist

## 2020-07-12 DIAGNOSIS — I4891 Unspecified atrial fibrillation: Secondary | ICD-10-CM

## 2020-07-12 DIAGNOSIS — Z87891 Personal history of nicotine dependence: Secondary | ICD-10-CM | POA: Insufficient documentation

## 2020-07-12 DIAGNOSIS — Z79899 Other long term (current) drug therapy: Secondary | ICD-10-CM | POA: Insufficient documentation

## 2020-07-12 DIAGNOSIS — I480 Paroxysmal atrial fibrillation: Secondary | ICD-10-CM | POA: Diagnosis not present

## 2020-07-12 DIAGNOSIS — I10 Essential (primary) hypertension: Secondary | ICD-10-CM | POA: Diagnosis not present

## 2020-07-12 DIAGNOSIS — G4733 Obstructive sleep apnea (adult) (pediatric): Secondary | ICD-10-CM | POA: Insufficient documentation

## 2020-07-12 DIAGNOSIS — E785 Hyperlipidemia, unspecified: Secondary | ICD-10-CM | POA: Insufficient documentation

## 2020-07-12 DIAGNOSIS — Z955 Presence of coronary angioplasty implant and graft: Secondary | ICD-10-CM | POA: Diagnosis not present

## 2020-07-12 DIAGNOSIS — I2510 Atherosclerotic heart disease of native coronary artery without angina pectoris: Secondary | ICD-10-CM | POA: Insufficient documentation

## 2020-07-12 DIAGNOSIS — R001 Bradycardia, unspecified: Secondary | ICD-10-CM | POA: Diagnosis not present

## 2020-07-12 DIAGNOSIS — I4819 Other persistent atrial fibrillation: Secondary | ICD-10-CM

## 2020-07-12 DIAGNOSIS — R0602 Shortness of breath: Secondary | ICD-10-CM | POA: Diagnosis not present

## 2020-07-12 DIAGNOSIS — Z7901 Long term (current) use of anticoagulants: Secondary | ICD-10-CM | POA: Insufficient documentation

## 2020-07-12 HISTORY — PX: CARDIOVERSION: SHX1299

## 2020-07-12 HISTORY — PX: TEE WITHOUT CARDIOVERSION: SHX5443

## 2020-07-12 SURGERY — ECHOCARDIOGRAM, TRANSESOPHAGEAL
Anesthesia: Monitor Anesthesia Care

## 2020-07-12 MED ORDER — PHENYLEPHRINE 40 MCG/ML (10ML) SYRINGE FOR IV PUSH (FOR BLOOD PRESSURE SUPPORT)
PREFILLED_SYRINGE | INTRAVENOUS | Status: DC | PRN
  Administered 2020-07-12: 80 ug via INTRAVENOUS
  Administered 2020-07-12: 40 ug via INTRAVENOUS

## 2020-07-12 MED ORDER — SODIUM CHLORIDE 0.9 % IV SOLN: INTRAVENOUS | Status: DC

## 2020-07-12 MED ORDER — PROPOFOL 10 MG/ML IV BOLUS
INTRAVENOUS | Status: DC | PRN
  Administered 2020-07-12: 20 mg via INTRAVENOUS
  Administered 2020-07-12 (×2): 25 mg via INTRAVENOUS

## 2020-07-12 MED ORDER — LIDOCAINE 2% (20 MG/ML) 5 ML SYRINGE
INTRAMUSCULAR | Status: DC | PRN
  Administered 2020-07-12: 60 mg via INTRAVENOUS

## 2020-07-12 MED ORDER — PROPOFOL 500 MG/50ML IV EMUL
INTRAVENOUS | Status: DC | PRN
  Administered 2020-07-12: 75 ug/kg/min via INTRAVENOUS

## 2020-07-12 MED ORDER — PERFLUTREN LIPID MICROSPHERE
1.0000 mL | INTRAVENOUS | Status: DC | PRN
  Administered 2020-07-12: 1 mL via INTRAVENOUS
  Filled 2020-07-12: qty 10

## 2020-07-12 NOTE — Anesthesia Postprocedure Evaluation (Signed)
Anesthesia Post Note  Patient: Douglas [name]  Procedure(s) Performed: TRANSESOPHAGEAL ECHOCARDIOGRAM (TEE) (N/A ) CARDIOVERSION (N/A )     Patient location during evaluation: PACU Anesthesia Type: MAC Level of consciousness: awake Pain management: pain level controlled Vital Signs Assessment: post-procedure vital signs reviewed and stable Respiratory status: spontaneous breathing and respiratory function stable Cardiovascular status: stable Postop Assessment: no apparent nausea or vomiting Anesthetic complications: no   No complications documented.  Last Vitals:  Vitals:   07/12/20 0705 07/12/20 0840  BP: (!) 142/100 99/63  Pulse: 84 61  Resp: 19 (!) 21  Temp: 36.5 C 36.6 C  SpO2: 96% 95%    Last Pain:  Vitals:   07/12/20 0840  TempSrc: Temporal  PainSc: 0-No pain                 Merlinda Frederick

## 2020-07-12 NOTE — Transfer of Care (Signed)
Immediate Anesthesia Transfer of Care Note  Patient: Douglas [name]  Procedure(s) Performed: TRANSESOPHAGEAL ECHOCARDIOGRAM (TEE) (N/A ) CARDIOVERSION (N/A )  Patient Location: PACU and Endoscopy Unit  Anesthesia Type:MAC  Level of Consciousness: drowsy, patient cooperative and responds to stimulation  Airway & Oxygen Therapy: Patient Spontanous Breathing and Patient connected to nasal cannula oxygen  Post-op Assessment: Report given to RN and Post -op Vital signs reviewed and stable  Post vital signs: Reviewed and stable  Last Vitals:  Vitals Value Taken Time  BP    Temp    Pulse    Resp    SpO2      Last Pain:  Vitals:   07/12/20 0705  TempSrc: Oral         Complications: No complications documented.

## 2020-07-12 NOTE — Discharge Instructions (Signed)
Electrical Cardioversion Electrical cardioversion is the delivery of a jolt of electricity to restore a normal rhythm to the heart. A rhythm that is too fast or is not regular keeps the heart from pumping well. In this procedure, sticky patches or metal paddles are placed on the chest to deliver electricity to the heart from a device. This procedure may be done in an emergency if:  There is low or no blood pressure as a result of the heart rhythm.  Normal rhythm must be restored as fast as possible to protect the brain and heart from further damage.  It may save a life. This may also be a scheduled procedure for irregular or fast heart rhythms that are not immediately life-threatening. Tell a health care provider about:  Any allergies you have.  All medicines you are taking, including vitamins, herbs, eye drops, creams, and over-the-counter medicines.  Any problems you or family members have had with anesthetic medicines.  Any blood disorders you have.  Any surgeries you have had.  Any medical conditions you have.  Whether you are pregnant or may be pregnant. What are the risks? Generally, this is a safe procedure. However, problems may occur, including:  Allergic reactions to medicines.  A blood clot that breaks free and travels to other parts of your body.  The possible return of an abnormal heart rhythm within hours or days after the procedure.  Your heart stopping (cardiac arrest). This is rare. What happens before the procedure? Medicines  Your health care provider may have you start taking: ? Blood-thinning medicines (anticoagulants) so your blood does not clot as easily. ? Medicines to help stabilize your heart rate and rhythm.  Ask your health care provider about: ? Changing or stopping your regular medicines. This is especially important if you are taking diabetes medicines or blood thinners. ? Taking medicines such as aspirin and ibuprofen. These medicines can  thin your blood. Do not take these medicines unless your health care provider tells you to take them. ? Taking over-the-counter medicines, vitamins, herbs, and supplements. General instructions  Follow instructions from your health care provider about eating or drinking restrictions.  Plan to have someone take you home from the hospital or clinic.  If you will be going home right after the procedure, plan to have someone with you for 24 hours.  Ask your health care provider what steps will be taken to help prevent infection. These may include washing your skin with a germ-killing soap. What happens during the procedure?  An IV will be inserted into one of your veins.  Sticky patches (electrodes) or metal paddles may be placed on your chest.  You will be given a medicine to help you relax (sedative).  An electrical shock will be delivered. The procedure may vary among health care providers and hospitals.   What can I expect after the procedure?  Your blood pressure, heart rate, breathing rate, and blood oxygen level will be monitored until you leave the hospital or clinic.  Your heart rhythm will be watched to make sure it does not change.  You may have some redness on the skin where the shocks were given. Follow these instructions at home:  Do not drive for 24 hours if you were given a sedative during your procedure.  Take over-the-counter and prescription medicines only as told by your health care provider.  Ask your health care provider how to check your pulse. Check it often.  Rest for 48 hours after the procedure   or as told by your health care provider.  Avoid or limit your caffeine use as told by your health care provider.  Keep all follow-up visits as told by your health care provider. This is important. Contact a health care provider if:  You feel like your heart is beating too quickly or your pulse is not regular.  You have a serious muscle cramp that does not go  away. Get help right away if:  You have discomfort in your chest.  You are dizzy or you feel faint.  You have trouble breathing or you are short of breath.  Your speech is slurred.  You have trouble moving an arm or leg on one side of your body.  Your fingers or toes turn cold or blue. Summary  Electrical cardioversion is the delivery of a jolt of electricity to restore a normal rhythm to the heart.  This procedure may be done right away in an emergency or may be a scheduled procedure if the condition is not an emergency.  Generally, this is a safe procedure.  After the procedure, check your pulse often as told by your health care provider. This information is not intended to replace advice given to you by your health care provider. Make sure you discuss any questions you have with your health care provider. Document Revised: 11/08/2018 Document Reviewed: 11/08/2018 Elsevier Patient Education  [employer]. Transesophageal Echocardiogram Transesophageal echocardiogram (TEE) is a test that uses sound waves to take pictures of your heart. TEE is done by passing a small probe attached to a flexible tube down the part of the body that moves food from your mouth to your stomach (esophagus). The pictures give clear images of your heart. This can help your doctor see if there are problems with your heart. Tell a doctor about:  Any allergies you have.  All medicines you are taking. This includes vitamins, herbs, eye drops, creams, and over-the-counter medicines.  Any problems you or family members have had with anesthetic medicines.  Any blood disorders you have.  Any surgeries you have had.  Any medical conditions you have.  Any swallowing problems.  Whether you have or have had a blockage in the part of the body that moves food from your mouth to your stomach.  Whether you are pregnant or may be pregnant. What are the risks? In general, this is a safe procedure. But,  problems may occur, such as:  Damage to nearby structures or organs.  A tear in the part of the body that moves food from your mouth to your stomach.  Irregular heartbeat.  Hoarse voice or trouble swallowing.  Bleeding. What happens before the procedure? Medicines  Ask your doctor about changing or stopping: ? Your normal medicines. ? Vitamins, herbs, and supplements. ? Over-the-counter medicines.  Do not take aspirin or ibuprofen unless you are told to. General instructions  Follow instructions from your doctor about what you cannot eat or drink.  You will take out any dentures or dental retainers.  Plan to have a responsible adult take you home from the hospital or clinic.  Plan to have a responsible adult care for you for the time you are told after you leave the hospital or clinic. This is important. What happens during the procedure?  An IV will be put into one of your veins.  You may be given: ? A sedative. This medicine helps you relax. ? A medicine to numb the back of your throat. This may be  sprayed or gargled.  Your blood pressure, heart rate, and breathing will be watched.  You may be asked to lie on your left side.  A bite block will be placed in your mouth. This keeps you from biting the tube.  The tip of the probe will be placed into the back of your mouth.  You will be asked to swallow.  Your doctor will take pictures of your heart.  The probe and bite block will be taken out after the test is done. The procedure may vary among doctors and hospitals.   What can I expect after the procedure?  You will be monitored until you leave the hospital or clinic. This includes checking your blood pressure, heart rate, breathing rate, and blood oxygen level.  Your throat may feel sore and numb. This will get better over time. You will not be allowed to eat or drink until the numbness has gone away.  It is common to have a sore throat for a day or two.  It  is up to you to get the results of your procedure. Ask how to get your results when they are ready. Follow these instructions at home:  If you were given a sedative during your procedure, do not drive or use machines until your doctor says that it is safe.  Return to your normal activities when your doctor says that it is safe.  Keep all follow-up visits. Summary  TEE is a test that uses sound waves to take pictures of your heart.  You will be given a medicine to help you relax.  Do not drive or use machines until your doctor says that it is safe. This information is not intended to replace advice given to you by your health care provider. Make sure you discuss any questions you have with your health care provider. Document Revised: 11/29/2019 Document Reviewed: 11/29/2019 Elsevier Patient Education  2021 [employer].

## 2020-07-12 NOTE — Interval H&P Note (Signed)
History and Physical Interval Note:  07/12/2020 7:27 AM  Douglas [name]  has presented today for surgery, with the diagnosis of AFIB.  The various methods of treatment have been discussed with the patient and family. After consideration of risks, benefits and other options for treatment, the patient has consented to  Procedure(s): TRANSESOPHAGEAL ECHOCARDIOGRAM (TEE) (N/A) CARDIOVERSION (N/A) as a surgical intervention.  The patient's history has been reviewed, patient examined, no change in status, stable for surgery.  I have reviewed the patient's chart and labs.  Questions were answered to the patient's satisfaction.     Donato Heinz

## 2020-07-12 NOTE — CV Procedure (Signed)
   TRANSESOPHAGEAL ECHOCARDIOGRAM GUIDED DIRECT CURRENT CARDIOVERSION  NAME:  Douglas [name]   MRN: [id number] DOB:  09-Jul-1945   ADMIT DATE: 07/12/2020  INDICATIONS: Symptomatic atrial fibrillation  PROCEDURE:   Informed consent was obtained prior to the procedure. The risks, benefits and alternatives for the procedure were discussed and the patient comprehended these risks.  Risks include, but are not limited to, cough, sore throat, vomiting, nausea, somnolence, esophageal and stomach trauma or perforation, bleeding, low blood pressure, aspiration, pneumonia, infection, trauma to the teeth and death.    After a procedural time-out, the oropharynx was anesthetized and the patient was sedated by the anesthesia service. The transesophageal probe was inserted in the esophagus and stomach without difficulty and multiple views were obtained. Anesthesia was monitored by Dr. Elgie Congo and Everlean Cherry, CRNA.   COMPLICATIONS:    Complications: No complications Patient tolerated procedure well.  FINDINGS:  Spontaneous echo contrast in LAA but no thrombus seen   CARDIOVERSION:     Indications:  Symptomatic Atrial Fibrillation  Procedure Details:  Once the TEE was complete, the patient had the defibrillator pads placed in the anterior and posterior position. Once an appropriate level of sedation was confirmed, the patient was cardioverted x 1 with 200J of biphasic synchronized energy.  The patient converted to NSR with rate 50s.  There were no apparent complications.  The patient had normal neuro status and respiratory status post procedure with vitals stable as recorded elsewhere.  Adequate airway was maintained throughout and vital signs monitored per protocol.  Oswaldo Milian MD [facility]  [address] [place], [place] [address] [phone]   8:35 AM

## 2020-07-12 NOTE — Anesthesia Procedure Notes (Signed)
Procedure Name: MAC Date/Time: 07/12/2020 8:01 AM Performed by: Colin Benton, CRNA Pre-anesthesia Checklist: Patient identified, Emergency Drugs available, Suction available and Patient being monitored Patient Re-evaluated:Patient Re-evaluated prior to induction Oxygen Delivery Method: Nasal cannula Induction Type: IV induction Airway Equipment and Method: Bite block Placement Confirmation: positive ETCO2 Dental Injury: Teeth and Oropharynx as per pre-operative assessment

## 2020-07-12 NOTE — Progress Notes (Signed)
  Echocardiogram Echocardiogram Transesophageal has been performed with Definity.  Douglas [name] 07/12/2020, 8:49 AM

## 2020-07-14 ENCOUNTER — Encounter (HOSPITAL_COMMUNITY): Payer: Self-pay | Admitting: Cardiology

## 2020-07-16 ENCOUNTER — Telehealth: Payer: Self-pay | Admitting: Cardiovascular Disease

## 2020-07-16 NOTE — Telephone Encounter (Signed)
Discussed with Dr Oval Linsey, no further recommendations at this time

## 2020-07-16 NOTE — Telephone Encounter (Signed)
Pt called to report that he had a cardioversion 07/12/20 and he saw his PCP this morning and noted on EKG he was back in Afib... he feels well and says his HR seems to be normal and denies palpitations.He will continue his Eliquis... and keep his 08/07/20 appt with Sande Rives PA but will forward to Dr. Oval Linsey and if she has any changes to make we will call him back and he asks to use his cell number.
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
## 2020-07-30 ENCOUNTER — Telehealth: Payer: Self-pay | Admitting: Cardiovascular Disease

## 2020-07-30 NOTE — Telephone Encounter (Signed)
Received a call from patient stated he had a cardioversion 2 weeks ago.Stated he went out of rhythm 1&1/2 days later.Stated he feels awful.He is sob,chest tightness occasionally.Advised I will call afib clinic for appointment.  Spoke to afib clinic appointment scheduled with Roderic Palau PA 4/13 at 9:30 am.Advised to go to ED if he gets worse.

## 2020-07-30 NOTE — Telephone Encounter (Signed)
Patient c/o Palpitations:  High priority if patient c/o lightheadedness, shortness of breath, or chest pain  1) How long have you had palpitations/irregular HR/ Afib? Are you having the symptoms now? Since 3/28, yes  2) Are you currently experiencing lightheadedness, SOB or CP? SOB, tired, little chest tightness  3) Do you have a history of afib (atrial fibrillation) or irregular heart rhythm? yes  4) Have you checked your BP or HR? (document readings if available): 111/82 after medication HR 89  5) Are you experiencing any other symptoms? Tiredness   Patient's wife states the patient has been in afib since 07/16/2020. She states he had a cardioversion and it worked but is not back in afib. She states he is having SOB and a little chest tightness.

## 2020-08-01 ENCOUNTER — Telehealth: Payer: Self-pay | Admitting: Pharmacist

## 2020-08-01 ENCOUNTER — Encounter (HOSPITAL_COMMUNITY): Payer: Self-pay | Admitting: Nurse Practitioner

## 2020-08-01 ENCOUNTER — Other Ambulatory Visit: Payer: Self-pay

## 2020-08-01 ENCOUNTER — Ambulatory Visit (HOSPITAL_COMMUNITY)
Admission: RE | Admit: 2020-08-01 | Discharge: 2020-08-01 | Disposition: A | Discharge status: Home / Self Care | Payer: Medicare Other | Source: Ambulatory Visit | Attending: Nurse Practitioner | Admitting: Nurse Practitioner

## 2020-08-01 VITALS — BP 116/90 | HR 78 | Ht 67.0 in | Wt 189.8 lb

## 2020-08-01 DIAGNOSIS — Z79899 Other long term (current) drug therapy: Secondary | ICD-10-CM | POA: Insufficient documentation

## 2020-08-01 DIAGNOSIS — D6869 Other thrombophilia: Secondary | ICD-10-CM | POA: Diagnosis not present

## 2020-08-01 DIAGNOSIS — Z8249 Family history of ischemic heart disease and other diseases of the circulatory system: Secondary | ICD-10-CM | POA: Insufficient documentation

## 2020-08-01 DIAGNOSIS — Z87891 Personal history of nicotine dependence: Secondary | ICD-10-CM | POA: Insufficient documentation

## 2020-08-01 DIAGNOSIS — I480 Paroxysmal atrial fibrillation: Secondary | ICD-10-CM

## 2020-08-01 DIAGNOSIS — Z7901 Long term (current) use of anticoagulants: Secondary | ICD-10-CM | POA: Insufficient documentation

## 2020-08-01 DIAGNOSIS — I4819 Other persistent atrial fibrillation: Secondary | ICD-10-CM | POA: Diagnosis present

## 2020-08-01 LAB — BASIC METABOLIC PANEL
Anion gap: 7 (ref 5–15)
BUN: 20 mg/dL (ref 8–23)
CO2: 25 mmol/L (ref 22–32)
Calcium: 9 mg/dL (ref 8.9–10.3)
Chloride: 103 mmol/L (ref 98–111)
Creatinine, Ser: 1.02 mg/dL (ref 0.61–1.24)
GFR, Estimated: >60 mL/min (ref >60)
Glucose, Bld: 111 mg/dL — ABNORMAL HIGH (ref 70–99)
Potassium: 5 mmol/L (ref 3.5–5.1)
Sodium: 135 mmol/L (ref 135–145)

## 2020-08-01 LAB — CBC
HCT: 50.1 % (ref 39.0–52.0)
Hemoglobin: 15.9 g/dL (ref 13.0–17.0)
MCH: 28 pg (ref 26.0–34.0)
MCHC: 31.7 g/dL (ref 30.0–36.0)
MCV: 88.2 fL (ref 80.0–100.0)
Platelets: 215 10*3/uL (ref 150–400)
RBC: 5.68 MIL/uL (ref 4.22–5.81)
RDW: 15.1 % (ref 11.5–15.5)
WBC: 6 10*3/uL (ref 4.0–10.5)
nRBC: 0 % (ref 0.0–0.2)

## 2020-08-01 LAB — MAGNESIUM: Magnesium: 2.2 mg/dL (ref 1.7–2.4)

## 2020-08-01 NOTE — Telephone Encounter (Signed)
Medication list reviewed in anticipation of upcoming Tikosyn initiation. Patient is not taking any contraindicated or QTc prolonging medications.   Patient is anticoagulated on Eliquis on the appropriate dose. Please ensure that patient has not missed any anticoagulation doses in the 3 weeks prior to Tikosyn initiation.   Patient will need to be counseled to avoid use of Benadryl while on Tikosyn and in the 2-3 days prior to Tikosyn initiation.  
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
## 2020-08-07 ENCOUNTER — Ambulatory Visit: Payer: Medicare Other | Admitting: Student

## 2020-08-24 ENCOUNTER — Other Ambulatory Visit (HOSPITAL_COMMUNITY)
Admission: RE | Admit: 2020-08-24 | Discharge: 2020-08-24 | Disposition: A | Discharge status: Home / Self Care | Payer: Medicare Other | Source: Ambulatory Visit | Attending: Nurse Practitioner | Admitting: Nurse Practitioner

## 2020-08-24 DIAGNOSIS — Z01812 Encounter for preprocedural laboratory examination: Secondary | ICD-10-CM | POA: Insufficient documentation

## 2020-08-24 DIAGNOSIS — Z20822 Contact with and (suspected) exposure to covid-19: Secondary | ICD-10-CM | POA: Insufficient documentation

## 2020-08-24 LAB — SARS CORONAVIRUS 2 (TAT 6-24 HRS): SARS Coronavirus 2: NEGATIVE

## 2020-08-28 ENCOUNTER — Ambulatory Visit (HOSPITAL_COMMUNITY)
Admission: RE | Admit: 2020-08-28 | Discharge: 2020-08-28 | Disposition: A | Discharge status: Home / Self Care | Payer: Medicare Other | Source: Ambulatory Visit | Attending: Nurse Practitioner | Admitting: Nurse Practitioner

## 2020-08-28 ENCOUNTER — Encounter (HOSPITAL_COMMUNITY): Payer: Self-pay | Admitting: Nurse Practitioner

## 2020-08-28 ENCOUNTER — Other Ambulatory Visit: Payer: Self-pay

## 2020-08-28 ENCOUNTER — Inpatient Hospital Stay (HOSPITAL_COMMUNITY)
Admission: RE | Admit: 2020-08-28 | Discharge: 2020-08-31 | DRG: 309 | Disposition: A | Discharge status: Home / Self Care | Payer: Medicare Other | Source: Ambulatory Visit | Attending: Internal Medicine | Admitting: Internal Medicine

## 2020-08-28 VITALS — BP 142/88 | HR 78 | Ht 67.0 in | Wt 189.8 lb

## 2020-08-28 DIAGNOSIS — Z8616 Personal history of COVID-19: Secondary | ICD-10-CM | POA: Diagnosis not present

## 2020-08-28 DIAGNOSIS — M109 Gout, unspecified: Secondary | ICD-10-CM | POA: Diagnosis present

## 2020-08-28 DIAGNOSIS — E785 Hyperlipidemia, unspecified: Secondary | ICD-10-CM | POA: Diagnosis present

## 2020-08-28 DIAGNOSIS — K219 Gastro-esophageal reflux disease without esophagitis: Secondary | ICD-10-CM | POA: Diagnosis present

## 2020-08-28 DIAGNOSIS — I110 Hypertensive heart disease with heart failure: Secondary | ICD-10-CM | POA: Diagnosis present

## 2020-08-28 DIAGNOSIS — I4819 Other persistent atrial fibrillation: Principal | ICD-10-CM | POA: Diagnosis present

## 2020-08-28 DIAGNOSIS — I5042 Chronic combined systolic (congestive) and diastolic (congestive) heart failure: Secondary | ICD-10-CM | POA: Diagnosis present

## 2020-08-28 DIAGNOSIS — I252 Old myocardial infarction: Secondary | ICD-10-CM | POA: Diagnosis not present

## 2020-08-28 DIAGNOSIS — I10 Essential (primary) hypertension: Secondary | ICD-10-CM | POA: Diagnosis not present

## 2020-08-28 DIAGNOSIS — Z833 Family history of diabetes mellitus: Secondary | ICD-10-CM

## 2020-08-28 DIAGNOSIS — I2510 Atherosclerotic heart disease of native coronary artery without angina pectoris: Secondary | ICD-10-CM | POA: Diagnosis present

## 2020-08-28 DIAGNOSIS — G4733 Obstructive sleep apnea (adult) (pediatric): Secondary | ICD-10-CM | POA: Diagnosis present

## 2020-08-28 DIAGNOSIS — Z955 Presence of coronary angioplasty implant and graft: Secondary | ICD-10-CM

## 2020-08-28 DIAGNOSIS — Z7901 Long term (current) use of anticoagulants: Secondary | ICD-10-CM

## 2020-08-28 DIAGNOSIS — D6869 Other thrombophilia: Secondary | ICD-10-CM

## 2020-08-28 DIAGNOSIS — Z8249 Family history of ischemic heart disease and other diseases of the circulatory system: Secondary | ICD-10-CM

## 2020-08-28 DIAGNOSIS — I4891 Unspecified atrial fibrillation: Secondary | ICD-10-CM | POA: Diagnosis present

## 2020-08-28 DIAGNOSIS — I480 Paroxysmal atrial fibrillation: Secondary | ICD-10-CM | POA: Diagnosis present

## 2020-08-28 LAB — BASIC METABOLIC PANEL
Anion gap: 5 (ref 5–15)
BUN: 19 mg/dL (ref 8–23)
CO2: 27 mmol/L (ref 22–32)
Calcium: 9.2 mg/dL (ref 8.9–10.3)
Chloride: 101 mmol/L (ref 98–111)
Creatinine, Ser: 1.05 mg/dL (ref 0.61–1.24)
GFR, Estimated: >60 mL/min (ref >60)
Glucose, Bld: 106 mg/dL — ABNORMAL HIGH (ref 70–99)
Potassium: 4.9 mmol/L (ref 3.5–5.1)
Sodium: 133 mmol/L — ABNORMAL LOW (ref 135–145)

## 2020-08-28 LAB — MAGNESIUM: Magnesium: 2.2 mg/dL (ref 1.7–2.4)

## 2020-08-28 MED ORDER — ACETAMINOPHEN 500 MG PO TABS: 500.0000 mg | ORAL_TABLET | Freq: Four times a day (QID) | ORAL | Status: DC | PRN

## 2020-08-28 MED ORDER — SODIUM CHLORIDE 0.9 % IV SOLN: 250.0000 mL | INTRAVENOUS | Status: DC | PRN

## 2020-08-28 MED ORDER — SODIUM CHLORIDE 0.9% FLUSH
3.0000 mL | Freq: Two times a day (BID) | INTRAVENOUS | Status: DC
  Administered 2020-08-28 – 2020-08-31 (×4): 3 mL via INTRAVENOUS

## 2020-08-28 MED ORDER — APIXABAN 5 MG PO TABS
5.0000 mg | ORAL_TABLET | Freq: Two times a day (BID) | ORAL | Status: DC
  Administered 2020-08-28 – 2020-08-31 (×6): 5 mg via ORAL
  Filled 2020-08-28 (×6): qty 1

## 2020-08-28 MED ORDER — NITROGLYCERIN 0.4 MG SL SUBL: 0.4000 mg | SUBLINGUAL_TABLET | SUBLINGUAL | Status: DC | PRN

## 2020-08-28 MED ORDER — SIMVASTATIN 20 MG PO TABS
20.0000 mg | ORAL_TABLET | Freq: Every day | ORAL | Status: DC
  Administered 2020-08-28 – 2020-08-30 (×3): 20 mg via ORAL
  Filled 2020-08-28 (×3): qty 1

## 2020-08-28 MED ORDER — DOFETILIDE 500 MCG PO CAPS
500.0000 ug | ORAL_CAPSULE | Freq: Two times a day (BID) | ORAL | Status: DC
  Administered 2020-08-28 – 2020-08-31 (×6): 500 ug via ORAL
  Filled 2020-08-28 (×6): qty 1

## 2020-08-28 MED ORDER — SODIUM CHLORIDE 0.9% FLUSH: 3.0000 mL | INTRAVENOUS | Status: DC | PRN

## 2020-08-28 MED ORDER — PANTOPRAZOLE SODIUM 40 MG PO TBEC
40.0000 mg | DELAYED_RELEASE_TABLET | Freq: Every day | ORAL | Status: DC
  Administered 2020-08-29 – 2020-08-31 (×3): 40 mg via ORAL
  Filled 2020-08-28 (×3): qty 1

## 2020-08-28 MED ORDER — CARVEDILOL 12.5 MG PO TABS
12.5000 mg | ORAL_TABLET | Freq: Two times a day (BID) | ORAL | Status: DC
  Administered 2020-08-28 – 2020-08-30 (×4): 12.5 mg via ORAL
  Filled 2020-08-28 (×4): qty 1

## 2020-08-28 MED ORDER — AMLODIPINE BESYLATE 10 MG PO TABS
10.0000 mg | ORAL_TABLET | Freq: Every day | ORAL | Status: DC
  Administered 2020-08-29 – 2020-08-30 (×2): 10 mg via ORAL
  Filled 2020-08-28 (×2): qty 1

## 2020-08-28 MED ORDER — VITAMIN D 25 MCG (1000 UNIT) PO TABS
2000.0000 [IU] | ORAL_TABLET | Freq: Every day | ORAL | Status: DC
  Administered 2020-08-29 – 2020-08-31 (×3): 2000 [IU] via ORAL
  Filled 2020-08-28 (×3): qty 2
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
## 2020-08-28 NOTE — Progress Notes (Signed)
Pharmacy: Dofetilide (Tikosyn) - Initial Consult Assessment and Electrolyte Replacement  Pharmacy consulted to assist in monitoring and replacing electrolytes in this 75 y.o. male admitted on 08/28/2020 undergoing dofetilide initiation.   Assessment:  Patient Exclusion Criteria: If any screening criteria checked as "Yes", then  patient  should NOT receive dofetilide until criteria item is corrected.  If "Yes" please indicate correction plan.  YES  NO Patient  Exclusion Criteria Correction Plan   []   [x]   Baseline QTc interval is greater than or equal to 440 msec. IF above YES box checked dofetilide contraindicated unless patient has ICD; then may proceed if QTc 500-550 msec or with known ventricular conduction abnormalities may proceed with QTc 550-600 msec. QTc =  417    []   [x]   Patient is known or suspected to have a digoxin level greater than 2 ng/ml: No results found for: DIGOXIN     []   [x]   Creatinine clearance less than 20 ml/min (calculated using Cockcroft-Gault, actual body weight and serum creatinine): Estimated Creatinine Clearance: 63.5 mL/min (by C-G formula based on SCr of 1.05 mg/dL).     []   [x]  Patient has received drugs known to prolong the QT intervals within the last 48 hours (phenothiazines, tricyclics or tetracyclic antidepressants, erythromycin, H-1 antihistamines, cisapride, fluoroquinolones, azithromycin, ondansetron).   Updated information on QT prolonging agents is available to be searched on the following database:QT prolonging agents     []   [x]   Patient received a dose of hydrochlorothiazide (Oretic) alone or in any combination including triamterene (Dyazide, Maxzide) in the last 48 hours.    []   [x]  Patient received a medication known to increase dofetilide plasma concentrations prior to initial dofetilide dose:  . Trimethoprim (Primsol, Proloprim) in the last 36 hours . Verapamil (Calan, Verelan) in the last 36 hours or a sustained release  dose in the last 72 hours . Megestrol (Megace) in the last 5 days  . Cimetidine (Tagamet) in the last 6 hours . Ketoconazole (Nizoral) in the last 24 hours . Itraconazole (Sporanox) in the last 48 hours  . Prochlorperazine (Compazine) in the last 36 hours     []   [x]   Patient is known to have a history of torsades de pointes; congenital or acquired long QT syndromes.    []   [x]   Patient has received a Class 1 antiarrhythmic with less than 2 half-lives since last dose. (Disopyramide, Quinidine, Procainamide, Lidocaine, Mexiletine, Flecainide, Propafenone)    []   [x]   Patient has received amiodarone therapy in the past 3 months or amiodarone level is greater than 0.3 ng/ml.    Patient has been appropriately anticoagulated with apixaban.  Labs:    Component Value Date/Time   K 4.9 08/28/2020 1127   MG 2.2 08/28/2020 1127     Plan: Potassium: K >/= 4: Appropriate to initiate Tikosyn, no replacement needed    Magnesium: Mg >2: Appropriate to initiate Tikosyn, no replacement needed     Thank you for allowing pharmacy to participate in this patient's care   Hildred Laser, PharmD Clinical Pharmacist **Pharmacist phone directory can now be found on [email or web address] ([email or web address]).  Listed under [place].

## 2020-08-29 ENCOUNTER — Telehealth: Payer: Self-pay | Admitting: Cardiovascular Disease

## 2020-08-29 ENCOUNTER — Encounter (HOSPITAL_COMMUNITY): Payer: Self-pay | Admitting: Internal Medicine

## 2020-08-29 ENCOUNTER — Inpatient Hospital Stay (HOSPITAL_COMMUNITY): Payer: Medicare Other | Admitting: Anesthesiology

## 2020-08-29 DIAGNOSIS — I4819 Other persistent atrial fibrillation: Secondary | ICD-10-CM | POA: Diagnosis not present

## 2020-08-29 DIAGNOSIS — I10 Essential (primary) hypertension: Secondary | ICD-10-CM | POA: Diagnosis not present

## 2020-08-29 LAB — BASIC METABOLIC PANEL
Anion gap: 7 (ref 5–15)
BUN: 18 mg/dL (ref 8–23)
CO2: 29 mmol/L (ref 22–32)
Calcium: 9.2 mg/dL (ref 8.9–10.3)
Chloride: 101 mmol/L (ref 98–111)
Creatinine, Ser: 1.14 mg/dL (ref 0.61–1.24)
GFR, Estimated: >60 mL/min (ref >60)
Glucose, Bld: 126 mg/dL — ABNORMAL HIGH (ref 70–99)
Potassium: 4 mmol/L (ref 3.5–5.1)
Sodium: 137 mmol/L (ref 135–145)

## 2020-08-29 LAB — MAGNESIUM: Magnesium: 2.1 mg/dL (ref 1.7–2.4)

## 2020-08-29 NOTE — Telephone Encounter (Signed)
Dr [place] aware  

## 2020-08-29 NOTE — Telephone Encounter (Signed)
FYI- patient currently normal sinus, has been in and out of afib- cardioversion scheduled for tomorrow.

## 2020-08-29 NOTE — Telephone Encounter (Signed)
[facility] at [facility] called to say patient is back in normal sinus rhythm

## 2020-08-29 NOTE — TOC Benefit Eligibility Note (Signed)
Transition of Care [facility]) Benefit Eligibility Note    Patient Details  Name: Jarrick Fjeld MRN: [id number] Date of Birth: Aug 08, 1945   Medication/Dose: DOFETILIDE   500 MCG BID  Covered?: Yes  Tier: 3 Drug  Prescription Coverage [facility]  Spoke with Person/Company/Phone Number:: JEN    @  [employer] #  [phone]  Co-Pay: $47.00  Prior Approval: No  Deductible: Met (OUT-OF-POCKET: UNMET)  Additional Notes: TIKOSYN 500 MCG BID : NON-FORMULARY  P/A-YES # [phone]    Memory Argue Phone Number: 08/29/2020, 10:35 AM

## 2020-08-29 NOTE — Care Management (Addendum)
1758 08-29-20 Case Manager spoke with the patient regarding Tikosyn cost and pharmacy of choice. Patient is agreeable to cost at $47.00. Patient states he uses the [facility]; however, needs a cardiologist assigned at the [place]. Patient wants Rx sent to [facility] for first fill and refills. Medication is not in stock, will see if patient is willing to get first fill via [facility]. Once he gets everything set up at the [place] he may get the Rx changed to the [place]. Bethena Roys, RN,BSN Case Manager

## 2020-08-29 NOTE — Plan of Care (Signed)

## 2020-08-29 NOTE — Progress Notes (Signed)
Pharmacy: Dofetilide (Tikosyn) - Follow Up Assessment and Electrolyte Replacement  Pharmacy consulted to assist in monitoring and replacing electrolytes in this 75 y.o. male admitted on 08/28/2020 undergoing dofetilide initiation.   Labs:    Component Value Date/Time   K 4.0 08/29/2020 0243   MG 2.1 08/29/2020 0243     Plan: Potassium: K >/= 4: No additional supplementation needed  Magnesium: Mg > 2: No additional supplementation needed    Thank you for allowing pharmacy to participate in this patient's care   Hildred Laser, PharmD Clinical Pharmacist **Pharmacist phone directory can now be found on [email or web address] ([email or web address]).  Listed under [place].

## 2020-08-29 NOTE — Progress Notes (Addendum)
Progress Note  Patient Name: Douglas [name] Date of Encounter: 08/29/2020  [facility] Cardiologist: [name], MD   Subjective   OOB eating breakfast, no complaints  Inpatient Medications    Scheduled Meds: . amLODipine  10 mg Oral Daily  . apixaban  5 mg Oral BID  . carvedilol  12.5 mg Oral BID WC  . cholecalciferol  2,000 Units Oral Daily  . dofetilide  500 mcg Oral BID  . pantoprazole  40 mg Oral Daily  . simvastatin  20 mg Oral q1800  . sodium chloride flush  3 mL Intravenous Q12H   Continuous Infusions: . sodium chloride     PRN Meds: sodium chloride, acetaminophen, nitroGLYCERIN, sodium chloride flush   Vital Signs    Vitals:   08/28/20 1947 08/28/20 2215 08/29/20 0515 08/29/20 0756  BP: (!) 141/96  120/74 129/88  Pulse: 89  (!) 48 (!) 50  Resp: 18  17 16   Temp: 98.6 F (37 C) 98.4 F (36.9 C) 98.1 F (36.7 C) 97.8 F (36.6 C)  TempSrc: Oral Oral Oral Oral  SpO2: 97%  97%   Weight:      Height:        Intake/Output Summary (Last 24 hours) at 08/29/2020 0859 Last data filed at 08/29/2020 0515 Gross per 24 hour  Intake 510 ml  Output --  Net 510 ml   Last 3 Weights 08/28/2020 08/28/2020 08/01/2020  Weight (lbs) 188 lb 7.9 oz 189 lb 12.8 oz 189 lb 12.8 oz  Weight (kg) 85.5 kg 86.093 kg 86.093 kg      Telemetry    AFib 60's-80s - Personally Reviewed  ECG    Afib 70bpm, QT 453ms, QTc 456ms - Personally Reviewed  Physical Exam   GEN: No acute distress.   Neck: No JVD Cardiac: irreg-irreg, no murmurs, rubs, or gallops.  Respiratory: CTA b/l. GI: Soft, nontender, non-distended  MS: No edema; No deformity. Neuro:  Nonfocal  Psych: Normal affect   Labs    High Sensitivity Troponin:  No results for input(s): TROPONINIHS in the last 720 hours.    Chemistry Recent Labs  Lab 08/28/20 1127 08/29/20 0243  NA 133* 137  K 4.9 4.0  CL 101 101  CO2 27 29  GLUCOSE 106* 126*  BUN 19 18  CREATININE 1.05 1.14  CALCIUM 9.2  9.2  GFRNONAA >60 >60  ANIONGAP 5 7     HematologyNo results for input(s): WBC, RBC, HGB, HCT, MCV, MCH, MCHC, RDW, PLT in the last 168 hours.  BNPNo results for input(s): BNP, PROBNP in the last 168 hours.   DDimer No results for input(s): DDIMER in the last 168 hours.   Radiology    No results found.  Cardiac Studies    07/12/20; TEE IMPRESSIONS  1. Left ventricular ejection fraction, by estimation, is 45 to 50%. The  left ventricle has mildly decreased function. The left ventricle  demonstrates regional wall motion abnormalities. Apical hypokinesis.  2. Right ventricular systolic function is normal. The right ventricular  size is mildly enlarged.  3. The mitral valve is normal in structure. Trivial mitral valve  regurgitation.  4. The aortic valve is tricuspid. Aortic valve regurgitation is not  visualized.  5. Spontaneous echo contrast present, but no left atrial/left atrial  appendage thrombus was detected. Definity was administered with showed  filling of the appendage.    Echo 05/04/18: Study Conclusions  - Left ventricle: The cavity size was normal. Wall thickness was increased in a  pattern of mild LVH. There was mild focal basal hypertrophy of the septum. Systolic function was mildly reduced. The estimated ejection fraction was in the range of 45% to 50%. There is hypokinesis of the apical myocardium. Doppler parameters are consistent with abnormal left ventricular relaxation (grade 1 diastolic dysfunction). - Aortic valve: There was trivial regurgitation. 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 - Left atrium: The atrium was mildly dilated. - Pulmonary arteries: Systolic pressure was mildly increased. PA peak pressure: 33 mm Hg (S).  Impressions:  - Apical hypokinesis with overall mild LV dysfunction; mild diastolic dysfunction; mild LVH; trace AI; moderately dilated ascending aorta (4.6 cm; suggest CTA to  further assess); mild LAE; mild TR with mild pulmonary hypertension.   LHC 04/2018:  Prox RCA lesion is 75% stenosed.  A drug-eluting stent was successfully placed using a STENT ORSIRO [id number]. -Postdilated to 3.6 mm  Post intervention, there is a 0% residual stenosis.  Mid RCA lesion is 20% stenosed. Dist RCA lesion is 20% stenosed with 20% stenosed side branch in Ost RPDA.  There is mild left ventricular systolic dysfunction. The left ventricular ejection fraction is 45-50% by visual estimate. LV end diastolic pressure is moderately elevated.  Prox LAD to Mid LAD long stented segment is 10% stenosed.  SUMMARY  Two-vessel disease with widely patent stent in the proximal LAD and 75-80% proximal RCA stenosis.  Successful DES PCI of proximal RCA using O[place]ro DES 3.0 mm x 18 mm postdilated to 3.6 mm.  Moderately elevated LVEDP.  Low normal EF of 45 and 50%. Difficult to assess regional wall motion normality.  RECOMMENDATIONS  Continue aspirin plus Plavix, if the plan will be to convert to DOAC because of A. fib, would simply stop aspirin after 1 month.  Uncomplicated PCI, okay for discharge home today as same-day discharge stable.  Continue respect modification.  Continue beta-blocker   Patient Profile     75 y.o. male w/PMHx of CAD s/p MI and PCI (LAD 2007, RCA 2020), hypertension, hyperlipidemia, chronic occult GI bleed, and OSA on CPAP, chronic CHF (combined), and AFib admitted for Tikosyn initiation  Assessment & Plan    1. Persistent AFib     CHA2DS2Vasc is 73 (age, CAD, CHF, HTN), on Eliquis, appropriately dosed     Tikosyn load is in progress     K+ 4.0     Mag 2.1     Creat 1.14     QTc stable  DCCV tomorrow if not in SR, pt aware and agreeable  2. CAD     No anginal complaints     Home meds  3. HTN     Home meds  4. Chronic CHF     compensated  For questions or updates, please contact [place] Please consult [email or web address] for contact  info under        Signed, Baldwin Jamaica, PA-C  08/29/2020, 8:59 AM     I have seen, examined the patient, and reviewed the above assessment and plan.  Changes to above are made where necessary.  On exam,iRRR.   Remains in afib.  If he does not covert, will require [place] in am.  Risks of procedure discussed with the patient by me.  He wishes to proceed.  Co Sign: Thompson Grayer, MD

## 2020-08-30 ENCOUNTER — Encounter (HOSPITAL_COMMUNITY)
Admission: RE | Disposition: A | Discharge status: Home / Self Care | Payer: Self-pay | Source: Ambulatory Visit | Attending: Internal Medicine

## 2020-08-30 DIAGNOSIS — I4819 Other persistent atrial fibrillation: Secondary | ICD-10-CM | POA: Diagnosis not present

## 2020-08-30 LAB — MAGNESIUM: Magnesium: 2 mg/dL (ref 1.7–2.4)

## 2020-08-30 LAB — BASIC METABOLIC PANEL
Anion gap: 8 (ref 5–15)
BUN: 22 mg/dL (ref 8–23)
CO2: 25 mmol/L (ref 22–32)
Calcium: 8.9 mg/dL (ref 8.9–10.3)
Chloride: 102 mmol/L (ref 98–111)
Creatinine, Ser: 1.09 mg/dL (ref 0.61–1.24)
GFR, Estimated: >60 mL/min (ref >60)
Glucose, Bld: 109 mg/dL — ABNORMAL HIGH (ref 70–99)
Potassium: 3.9 mmol/L (ref 3.5–5.1)
Sodium: 135 mmol/L (ref 135–145)

## 2020-08-30 SURGERY — CANCELLED PROCEDURE

## 2020-08-30 MED ORDER — POTASSIUM CHLORIDE CRYS ER 20 MEQ PO TBCR
20.0000 meq | EXTENDED_RELEASE_TABLET | Freq: Once | ORAL | Status: AC
  Administered 2020-08-30: 20 meq via ORAL
  Filled 2020-08-30: qty 1

## 2020-08-30 MED ORDER — MAGNESIUM SULFATE 2 GM/50ML IV SOLN
2.0000 g | Freq: Once | INTRAVENOUS | Status: AC
  Administered 2020-08-30: 2 g via INTRAVENOUS
  Filled 2020-08-30: qty 50

## 2020-08-30 MED ORDER — CARVEDILOL 3.125 MG PO TABS: 3.1250 mg | ORAL_TABLET | Freq: Two times a day (BID) | ORAL | Status: DC

## 2020-08-30 MED ORDER — POTASSIUM CHLORIDE CRYS ER 20 MEQ PO TBCR: 40.0000 meq | EXTENDED_RELEASE_TABLET | Freq: Once | ORAL | Status: DC

## 2020-08-30 NOTE — Progress Notes (Addendum)
Progress Note  Patient Name: Douglas [name] Date of Encounter: 08/30/2020  [facility] Cardiologist: [name], MD   Subjective   OOB eating breakfast, no complaints  Inpatient Medications    Scheduled Meds: . amLODipine  10 mg Oral Daily  . apixaban  5 mg Oral BID  . carvedilol  12.5 mg Oral BID WC  . cholecalciferol  2,000 Units Oral Daily  . dofetilide  500 mcg Oral BID  . pantoprazole  40 mg Oral Daily  . simvastatin  20 mg Oral q1800  . sodium chloride flush  3 mL Intravenous Q12H   Continuous Infusions: . sodium chloride    . magnesium sulfate bolus IVPB     PRN Meds: sodium chloride, acetaminophen, nitroGLYCERIN, sodium chloride flush   Vital Signs    Vitals:   08/29/20 1716 08/29/20 2004 08/30/20 0542 08/30/20 0804  BP: 129/86 (!) 128/92 117/73 130/85  Pulse: (!) 57 (!) 57 (!) 51   Resp: 17     Temp: 98 F (36.7 C) (!) 97 F (36.1 C) (!) 97.4 F (36.3 C)   TempSrc: Oral Oral Axillary   SpO2: 98% 97% 97%   Weight:      Height:        Intake/Output Summary (Last 24 hours) at 08/30/2020 0958 Last data filed at 08/30/2020 0800 Gross per 24 hour  Intake 480 ml  Output --  Net 480 ml   Last 3 Weights 08/28/2020 08/28/2020 08/01/2020  Weight (lbs) 188 lb 7.9 oz 189 lb 12.8 oz 189 lb 12.8 oz  Weight (kg) 85.5 kg 86.093 kg 86.093 kg      Telemetry    SR 50's - Personally Reviewed  ECG    SB 54bpm,  QT 422ms, QTc 417ms - Personally Reviewed  Physical Exam   GEN: No acute distress.   Neck: No JVD Cardiac: RRR, bradycardic, no murmurs, rubs, or gallops.  Respiratory: CTA b/l. GI: Soft, nontender, non-distended  MS: No edema; No deformity. Neuro:  Nonfocal  Psych: Normal affect   Labs    High Sensitivity Troponin:  No results for input(s): TROPONINIHS in the last 720 hours.    Chemistry Recent Labs  Lab 08/28/20 1127 08/29/20 0243 08/30/20 0251  NA 133* 137 135  K 4.9 4.0 3.9  CL 101 101 102  CO2 27 29 25   GLUCOSE  106* 126* 109*  BUN 19 18 22   CREATININE 1.05 1.14 1.09  CALCIUM 9.2 9.2 8.9  GFRNONAA >60 >60 >60  ANIONGAP 5 7 8      HematologyNo results for input(s): WBC, RBC, HGB, HCT, MCV, MCH, MCHC, RDW, PLT in the last 168 hours.  BNPNo results for input(s): BNP, PROBNP in the last 168 hours.   DDimer No results for input(s): DDIMER in the last 168 hours.   Radiology    No results found.  Cardiac Studies    07/12/20; TEE IMPRESSIONS  1. Left ventricular ejection fraction, by estimation, is 45 to 50%. The  left ventricle has mildly decreased function. The left ventricle  demonstrates regional wall motion abnormalities. Apical hypokinesis.  2. Right ventricular systolic function is normal. The right ventricular  size is mildly enlarged.  3. The mitral valve is normal in structure. Trivial mitral valve  regurgitation.  4. The aortic valve is tricuspid. Aortic valve regurgitation is not  visualized.  5. Spontaneous echo contrast present, but no left atrial/left atrial  appendage thrombus was detected. Definity was administered with showed  filling of the appendage.  Echo 05/04/18: Study Conclusions  - Left ventricle: The cavity size was normal. Wall thickness was increased in a pattern of mild LVH. There was mild focal basal hypertrophy of the septum. Systolic function was mildly reduced. The estimated ejection fraction was in the range of 45% to 50%. There is hypokinesis of the apical myocardium. Doppler parameters are consistent with abnormal left ventricular relaxation (grade 1 diastolic dysfunction). - Aortic valve: There was trivial regurgitation. 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 - Left atrium: The atrium was mildly dilated. - Pulmonary arteries: Systolic pressure was mildly increased. PA peak pressure: 33 mm Hg (S).  Impressions:  - Apical hypokinesis with overall mild LV dysfunction; mild diastolic dysfunction;  mild LVH; trace AI; moderately dilated ascending aorta (4.6 cm; suggest CTA to further assess); mild LAE; mild TR with mild pulmonary hypertension.   LHC 04/2018:  Prox RCA lesion is 75% stenosed.  A drug-eluting stent was successfully placed using a STENT ORSIRO 3.0X18. -Postdilated to 3.6 mm  Post intervention, there is a 0% residual stenosis.  Mid RCA lesion is 20% stenosed. Dist RCA lesion is 20% stenosed with 20% stenosed side branch in Ost RPDA.  There is mild left ventricular systolic dysfunction. The left ventricular ejection fraction is 45-50% by visual estimate. LV end diastolic pressure is moderately elevated.  Prox LAD to Mid LAD long stented segment is 10% stenosed.  SUMMARY  Two-vessel disease with widely patent stent in the proximal LAD and 75-80% proximal RCA stenosis.  Successful DES PCI of proximal RCA using Osiro DES 3.0 mm x 18 mm postdilated to 3.6 mm.  Moderately elevated LVEDP.  Low normal EF of 45 and 50%. Difficult to assess regional wall motion normality.  RECOMMENDATIONS  Continue aspirin plus Plavix, if the plan will be to convert to DOAC because of A. fib, would simply stop aspirin after 1 month.  Uncomplicated PCI, okay for discharge home today as same-day discharge stable.  Continue respect modification.  Continue beta-blocker   Patient Profile     75 y.o. male w/PMHx of CAD s/p MI and PCI (LAD 2007, RCA 2020), hypertension, hyperlipidemia, chronic occult GI bleed, and OSA on CPAP, chronic CHF (combined), and AFib admitted for Tikosyn initiation  Assessment & Plan    1. Persistent AFib     CHA2DS2Vasc is 51 (age, CAD, CHF, HTN), on Eliquis, appropriately dosed     Tikosyn load is in progress     K+ 3.9 replaced     Mag 2.0     Creat 1.09     QTc stable  He has converted with drug SB 50's, he got his coreg this AM He had a 3.6 > 4.5sec post conversion pause essentially asymptomatic, heard the alarms go off and could tell his  heart had "changed gears", will reduce his coreg dose  Anticipate discharge tomorrow  2. CAD     No anginal complaints     Home meds  3. HTN     Home meds  4. Chronic CHF     compensated  For questions or updates, please contact [place] Please consult [email or web address] for contact info under        Signed, Baldwin Jamaica, PA-C  08/30/2020, 9:58 AM     I have seen, examined the patient, and reviewed the above assessment and plan.  Changes to above are made where necessary.  On exam, RRR. He converted to sinus.  Qt is stable.   No changes.  Hopefully home  tomorrow.  Co Sign: Thompson Grayer, MD 08/30/2020 10:11 AM

## 2020-08-30 NOTE — Progress Notes (Signed)
   08/30/20 1117  Assess: MEWS Score  Temp 98.1 F (36.7 C)  BP 96/68  Pulse Rate (!) 46  ECG Heart Rate (!) 48  Resp 18  SpO2 99 %  O2 Device Room Air  Assess: MEWS Score  MEWS Temp 0  MEWS Systolic 1  MEWS Pulse 1  MEWS RR 0  MEWS LOC 0  MEWS Score 2  MEWS Score Color Yellow  Assess: if the MEWS score is Yellow or Red  Were vital signs taken at a resting state? Yes  Focused Assessment No change from prior assessment  Early Detection of Sepsis Score *See Row Information* Low  MEWS guidelines implemented *See Row Information* Yes  Treat  MEWS Interventions Escalated (See documentation below)  Pain Scale 0-10  Pain Score 0  Escalate  MEWS: Escalate Yellow: discuss with charge nurse/RN and consider discussing with provider and RRT  Notify: Provider  Provider Name/Title Tommye Standard, PA  Date Provider Notified 08/30/20  Time Provider Notified 1100  Notification Type Face-to-face  Notification Reason Change in status  Provider response In department  Date of Provider Response 08/30/20  Time of Provider Response 1100  Document  Patient Outcome Stabilized after interventions  Progress note created (see row info) Yes  Assess: SIRS CRITERIA  SIRS Temperature  0  SIRS Pulse 0  SIRS Respirations  0  SIRS WBC 0  SIRS Score Sum  0  Pt had been in SB in 42s.  Now 36s.  Denies dizziness or lightheadedness. Denies SOB.

## 2020-08-30 NOTE — Discharge Summary (Addendum)
ELECTROPHYSIOLOGY PROCEDURE DISCHARGE SUMMARY    Patient ID: Douglas [name],  MRN: [id number], DOB/AGE: July 03, 1945 75 y.o.  Admit date: 08/28/2020 Discharge date: 08/31/20  Primary Care Physician: [name], MD  Primary Cardiologist: Dr. Oval Linsey Electrophysiologist: new, Dr. Rayann Heman  Primary Discharge Diagnosis:  1.  persistent atrial fibrillation status post Tikosyn loading this admission      CHA2DS2Vasc is 5, on Eliquis  Secondary Discharge Diagnosis:  1. CAD 2. HTN 3. HLD 4. H/o GIB 5. OSA w/CPAP 6. Chronic CHF (combined)  Allergies  Allergen Reactions  . Ace Inhibitors Cough  . Bee Venom     Other reaction(s): swelling  . Lisinopril Cough  . Losartan Cough     Procedures This Admission:  1.  Tikosyn loading   Brief HPI: Douglas [name] is a 75 y.o. male with a past medical history as noted above.  They were referred to the AFib clinic in the outpatient setting for treatment options of atrial fibrillation.  Risks, benefits, and alternatives to Tikosyn were reviewed with the patient who wished to proceed.    Hospital Course:  The patient was admitted and Tikosyn was initiated.  Renal function on admission Calc CrCl was 62, though here wobbled to 58 and 60, in review at baseline his Calc CrCl has been 60 or higher, in d/w Dr. Rayann Heman, will continue 542mcg dose his labs will  Be checked next week and the patient instructed to stay adequately hydrated. his electrolytes were followed during the hospitalization as well.  The patient's QTc remained stable.  He converted with drug and did not require DCCV  He was monitored until discharge on telemetry which demonstrated SB 40's overnight, and generally in the 50's awake.  He has been ambulating in his room and hallways without difficulties or symptoms with asymtomatic bradycardia. His last sinus EKG was SB in the 50's. We will stop his home coreg. On the day of discharge, he feels well, was  examined by Dr Rayann Heman who considered the patient stable for discharge to home.  Follow-up has been arranged with the AFib clinic in 1 week and with Dr Rayann Heman in 4 weeks.   Tikosyn teaching was completed electrolyte replacement for home> no new or additional replacement is needed His preferred pharmacy does NOT have drug  in stock, we will fill 1st month here with TOC and send Rx to his pharmacy.  [facility] as well and will look into cost with them and he will follow up on where he will get his next refill and ensure they order the drug.  Physical Exam: Vitals:   08/31/20 0007 08/31/20 0450 08/31/20 0818 08/31/20 0831  BP: 130/79 (!) 112/95 118/82   Pulse:  (!) 52    Resp: 18 17    Temp: (!) 97.5 F (36.4 C) (!) 97.5 F (36.4 C)  (!) 97.5 F (36.4 C)  TempSrc: Axillary Axillary  Axillary  SpO2: 96% 98%    Weight:      Height:         GEN- The patient is well appearing, alert and oriented x 3 today.   HEENT: normocephalic, atraumatic; sclera clear, conjunctiva pink; hearing intact; oropharynx clear; neck supple, no JVP Lymph- no cervical lymphadenopathy Lungs- CTA b/l, normal work of breathing.  No wheezes, rales, rhonchi Heart- RRR, no murmurs, rubs or gallops, PMI not laterally displaced GI- soft, non-tender, non-distended Extremities- no clubbing, cyanosis, or edema MS- no significant deformity or  atrophy Skin- warm and dry, no rash or lesion Psych- euthymic mood, full affect Neuro- strength and sensation are intact   Labs:   Lab Results  Component Value Date   WBC 6.0 08/01/2020   HGB 15.9 08/01/2020   HCT 50.1 08/01/2020   MCV 88.2 08/01/2020   PLT 215 08/01/2020    Recent Labs  Lab 08/31/20 0300  NA 136  K 4.1  CL 103  CO2 28  BUN 24*  CREATININE 1.13  CALCIUM 8.7*  GLUCOSE 101*     Discharge Medications:  Allergies as of 08/31/2020      Reactions   Ace Inhibitors Cough   Bee Venom    Other reaction(s): swelling   Lisinopril Cough    Losartan Cough      Medication List    STOP taking these medications   carvedilol 25 MG tablet Commonly known as: COREG     TAKE these medications   acetaminophen 500 MG tablet Commonly known as: TYLENOL Take 500-1,000 mg by mouth every 6 (six) hours as needed (for pain.).   amLODipine 10 MG tablet Commonly known as: NORVASC Take 10 mg by mouth daily.   apixaban 5 MG Tabs tablet Commonly known as: ELIQUIS Take 1 tablet (5 mg total) by mouth 2 (two) times daily.   colchicine 0.6 MG tablet Take 0.6 mg by mouth 2 (two) times daily as needed (gout). Colcrys   dofetilide 500 MCG capsule Commonly known as: TIKOSYN Take 1 capsule (500 mcg total) by mouth 2 (two) times daily.   fexofenadine 180 MG tablet Commonly known as: ALLEGRA Take 180 mg by mouth daily as needed for allergies.   furosemide 40 MG tablet Commonly known as: LASIX TAKE 1 TABLET BY MOUTH DAILY AS NEEDED FOR FLUID OR EDEMA What changed: See the new instructions.   nitroGLYCERIN 0.4 MG SL tablet Commonly known as: NITROSTAT Place 0.4 mg under the tongue every 5 (five) minutes as needed for chest pain.   omeprazole 20 MG capsule Commonly known as: PRILOSEC Take 20 mg by mouth daily.   simvastatin 20 MG tablet Commonly known as: ZOCOR TAKE 1 TABLET BY MOUTH EVERYDAY AT BEDTIME What changed: See the new instructions.   valACYclovir 1000 MG tablet Commonly known as: VALTREX Take 1,000 mg by mouth 2 (two) times daily as needed (fever blisters).   Vitamin D 50 MCG (2000 UT) tablet Take 2,000 Units by mouth daily.       Disposition: Home Discharge Instructions    Diet - low sodium heart healthy   Complete by: As directed    Increase activity slowly   Complete by: As directed       Follow-up Information    [facility].   Specialty: Cardiology Why: 09/06/20 @ 11:00AM with Maximino Greenland, NP Contact information: [address] [id number] [place] [phone]       Thompson Grayer, MD Follow up.   Specialty: Cardiology Why: 10/05/20 @ 4:00PM Contact information: [place] [address] [phone]               Duration of Discharge Encounter: Greater than 30 minutes including physician time.  Venetia Night, PA-C 08/31/2020 1:22 PM

## 2020-08-30 NOTE — Progress Notes (Addendum)
HR dipping into 45-50 range Pt is sitting in chair visiting with his wife and completely asymptomatic. Discussed will stop his coreg for now and see where his sinus rates land to decide on this medicine going forward  Do NOT hold Tikosyn for sinus bradycardia   Tommye Standard, PA-C

## 2020-08-30 NOTE — Progress Notes (Signed)
Pharmacy: Dofetilide (Tikosyn) - Follow Up Assessment and Electrolyte Replacement  Pharmacy consulted to assist in monitoring and replacing electrolytes in this 75 y.o. male admitted on 08/28/2020 undergoing dofetilide initiation.   Labs:    Component Value Date/Time   K 3.9 08/30/2020 0251   MG 2.0 08/30/2020 0251     Plan: Potassium: -Kdur 53meq per MD  Magnesium: Mg 1.8-2: Give Mg 2 gm IV x1    Thank you for allowing pharmacy to participate in this patient's care   Hildred Laser, PharmD Clinical Pharmacist **Pharmacist phone directory can now be found on [email or web address] ([email or web address]).  Listed under [place].

## 2020-08-31 ENCOUNTER — Other Ambulatory Visit (HOSPITAL_COMMUNITY): Payer: Self-pay

## 2020-08-31 LAB — BASIC METABOLIC PANEL
Anion gap: 5 (ref 5–15)
BUN: 24 mg/dL — ABNORMAL HIGH (ref 8–23)
CO2: 28 mmol/L (ref 22–32)
Calcium: 8.7 mg/dL — ABNORMAL LOW (ref 8.9–10.3)
Chloride: 103 mmol/L (ref 98–111)
Creatinine, Ser: 1.13 mg/dL (ref 0.61–1.24)
GFR, Estimated: >60 mL/min (ref >60)
Glucose, Bld: 101 mg/dL — ABNORMAL HIGH (ref 70–99)
Potassium: 4.1 mmol/L (ref 3.5–5.1)
Sodium: 136 mmol/L (ref 135–145)

## 2020-08-31 LAB — MAGNESIUM: Magnesium: 2.4 mg/dL (ref 1.7–2.4)

## 2020-08-31 MED ORDER — AMLODIPINE BESYLATE 2.5 MG PO TABS
2.5000 mg | ORAL_TABLET | Freq: Every day | ORAL | Status: DC
  Administered 2020-08-31: 2.5 mg via ORAL
  Filled 2020-08-31: qty 1

## 2020-08-31 MED ORDER — DOFETILIDE 500 MCG PO CAPS
500.0000 ug | ORAL_CAPSULE | Freq: Two times a day (BID) | ORAL | 0 refills | Status: DC
  Filled 2020-08-31: qty 60, 30d supply, fill #0

## 2020-08-31 MED ORDER — DOFETILIDE 500 MCG PO CAPS: 500.0000 ug | ORAL_CAPSULE | Freq: Two times a day (BID) | ORAL | 5 refills | Status: DC

## 2020-08-31 NOTE — Discharge Instructions (Addendum)
Atrial Fibrillation  Atrial fibrillation is a type of heartbeat that is irregular or fast. If you have this condition, your heart beats without any order. This makes it hard for your heart to pump blood in a normal way. Atrial fibrillation may come and go, or it may become a long-lasting problem. If this condition is not treated, it can put you at higher risk for stroke, heart failure, and other heart problems. What are the causes? This condition may be caused by diseases that damage the heart. They include:  High blood pressure.  Heart failure.  Heart valve disease.  Heart surgery. Other causes include:  Diabetes.  Thyroid disease.  Being overweight.  Kidney disease. Sometimes the cause is not known. What increases the risk? You are more likely to develop this condition if:  You are older.  You smoke.  You exercise often and very hard.  You have a family history of this condition.  You are a man.  You use drugs.  You drink a lot of alcohol.  You have lung conditions, such as emphysema, pneumonia, or COPD.  You have sleep apnea. What are the signs or symptoms? Common symptoms of this condition include:  A feeling that your heart is beating very fast.  Chest pain or discomfort.  Feeling short of breath.  Suddenly feeling light-headed or weak.  Getting tired easily during activity.  Fainting.  Sweating. In some cases, there are no symptoms. How is this treated? Treatment for this condition depends on underlying conditions and how you feel when you have atrial fibrillation. They include:  Medicines to: ? Prevent blood clots. ? Treat heart rate or heart rhythm problems.  Using devices, such as a pacemaker, to correct heart rhythm problems.  Doing surgery to remove the part of the heart that sends bad signals.  Closing an area where clots can form in the heart (left atrial appendage). In some cases, your doctor will treat other underlying  conditions. Follow these instructions at home: Medicines  Take over-the-counter and prescription medicines only as told by your doctor.  Do not take any new medicines without first talking to your doctor.  If you are taking blood thinners: ? Talk with your doctor before you take any medicines that have aspirin or NSAIDs, such as ibuprofen, in them. ? Take your medicine exactly as told by your doctor. Take it at the same time each day. ? Avoid activities that could hurt or bruise you. Follow instructions about how to prevent falls. ? Wear a bracelet that says you are taking blood thinners. Or, carry a card that lists what medicines you take. Lifestyle  Do not use any products that have nicotine or tobacco in them. These include cigarettes, e-cigarettes, and chewing tobacco. If you need help quitting, ask your doctor.  Eat heart-healthy foods. Talk with your doctor about the right eating plan for you.  Exercise regularly as told by your doctor.  Do not drink alcohol.  Lose weight if you are overweight.  Do not use drugs, including cannabis.      General instructions  If you have a condition that causes breathing to stop for a short period of time (apnea), treat it as told by your doctor.  Keep a healthy weight. Do not use diet pills unless your doctor says they are safe for you. Diet pills may make heart problems worse.  Keep all follow-up visits as told by your doctor. This is important. Contact a doctor if:  You notice a   change in the speed, rhythm, or strength of your heartbeat.  You are taking a blood-thinning medicine and you get more bruising.  You get tired more easily when you move or exercise.  You have a sudden change in weight. Get help right away if:  You have pain in your chest or your belly (abdomen).  You have trouble breathing.  You have side effects of blood thinners, such as blood in your vomit, poop (stool), or pee (urine), or bleeding that cannot  stop.  You have any signs of a stroke. "BE FAST" is an easy way to remember the main warning signs: ? B - Balance. Signs are dizziness, sudden trouble walking, or loss of balance. ? E - Eyes. Signs are trouble seeing or a change in how you see. ? F - Face. Signs are sudden weakness or loss of feeling in the face, or the face or eyelid drooping on one side. ? A - Arms. Signs are weakness or loss of feeling in an arm. This happens suddenly and usually on one side of the body. ? S - Speech. Signs are sudden trouble speaking, slurred speech, or trouble understanding what people say. ? T - Time. Time to call emergency services. Write down what time symptoms started.  You have other signs of a stroke, such as: ? A sudden, very bad headache with no known cause. ? Feeling like you may vomit (nausea). ? Vomiting. ? A seizure. These symptoms may be an emergency. Do not wait to see if the symptoms will go away. Get medical help right away. Call your local emergency services (911 in the [place] Do not drive yourself to the hospital.   Summary  Atrial fibrillation is a type of heartbeat that is irregular or fast.  You are at higher risk of this condition if you smoke, are older, have diabetes, or are overweight.  Follow your doctor's instructions about medicines, diet, exercise, and follow-up visits.  Get help right away if you have signs or symptoms of a stroke.  Get help right away if you cannot catch your breath, or you have chest pain or discomfort. This information is not intended to replace advice given to you by your health care provider. Make sure you discuss any questions you have with your health care provider. Document Revised: 09/29/2018 Document Reviewed: 09/29/2018 Elsevier Patient Education  2021 [place]. Dofetilide capsules What is this medicine? DOFETILIDE (doe FET il ide) is an antiarrhythmic drug. It helps make your heart beat regularly. This medicine also helps to slow  rapid heartbeats. This medicine may be used for other purposes; ask your health care provider or pharmacist if you have questions. COMMON BRAND NAME(S): Tikosyn What should I tell my health care provider before I take this medicine? They need to know if you have any of these conditions:  heart disease  history of irregular heartbeat  history of low levels of potassium or magnesium in the blood  kidney disease  liver disease  an unusual or allergic reaction to dofetilide, other medicines, foods, dyes, or preservatives  pregnant or trying to get pregnant  breast-feeding How should I use this medicine? Take this medicine by mouth with a glass of water. Follow the directions on the prescription label. Do not take with grapefruit juice. You can take it with or without food. If it upsets your stomach, take it with food. Take your medicine at regular intervals. Do not take it more often than directed. Do not stop taking except  on your doctor's advice. A special MedGuide will be given to you by the pharmacist with each prescription and refill. Be sure to read this information carefully each time. Talk to your pediatrician regarding the use of this medicine in children. Special care may be needed. Overdosage: If you think you have taken too much of this medicine contact a poison control center or emergency room at once. NOTE: This medicine is only for you. Do not share this medicine with others. What if I miss a dose? If you miss a dose, skip it. Take your next dose at the normal time. Do not take extra or 2 doses at the same time to make up for the missed dose. What may interact with this medicine? Do not take this medicine with any of the following medications:  cimetidine  cisapride  dolutegravir  dronedarone  erdafitinib  hydrochlorothiazide  ketoconazole  megestrol  pimozide  prochlorperazine  thioridazine  trimethoprim  verapamil This medicine may also interact  with the following medications:  amiloride  cannabinoids  certain antibiotics like erythromycin or clarithromycin  certain antiviral medicines for HIV or hepatitis  certain medicines for depression, anxiety, or psychotic disorders  digoxin  diltiazem  grapefruit juice  metformin  nefazodone  other medicines that prolong the QT interval (an abnormal heart rhythm)  quinine  triamterene  zafirlukast  ziprasidone This list may not describe all possible interactions. Give your health care provider a list of all the medicines, herbs, non-prescription drugs, or dietary supplements you use. Also tell them if you smoke, drink alcohol, or use illegal drugs. Some items may interact with your medicine. What should I watch for while using this medicine? Your condition will be monitored carefully while you are receiving this medicine. What side effects may I notice from receiving this medicine? Side effects that you should report to your doctor or health care professional as soon as possible:  allergic reactions like skin rash, itching or hives, swelling of the face, lips, or tongue  breathing problems  chest pain or chest tightness  dizziness  signs and symptoms of a dangerous change in heartbeat or heart rhythm like chest pain; dizziness; fast or irregular heartbeat; palpitations; feeling faint or lightheaded, falls; breathing problems  signs and symptoms of electrolyte imbalance like severe diarrhea, unusual sweating, vomiting, loss of appetite, increased thirst  swelling of the ankles, legs, or feet  tingling, numbness in the hands or feet Side effects that usually do not require medical attention (report to your doctor or health care professional if they continue or are bothersome):  diarrhea  general ill feeling or flu-like symptoms  headache  nausea  trouble sleeping  stomach pain This list may not describe all possible side effects. Call your doctor for  medical advice about side effects. You may report side effects to FDA at 1-800-FDA-1088. Where should I keep my medicine? Keep out of the reach of children. Store at room temperature between 15 and 30 degrees C (59 and 86 degrees F). Throw away any unused medicine after the expiration date. NOTE: This sheet is a summary. It may not cover all possible information. If you have questions about this medicine, talk to your doctor, pharmacist, or health care provider.  2021 Elsevier/Gold Standard (2019-03-08 12:14:05) Please follow up with your pharmacy to ensure they have gotten your Dofetilide (Tikosyn) prescription and have ordered the medication for you.

## 2020-08-31 NOTE — Progress Notes (Signed)
Post Tikosyn EKG done at 2207. EKG did not transmit to Epic. EKG is in chart.

## 2020-08-31 NOTE — Plan of Care (Signed)

## 2020-08-31 NOTE — Care Management Important Message (Signed)
Important Message  Patient Details  Name: Douglas [name] MRN: [id number] Date of Birth: [birth year]   Medicare Important Message Given:  Yes     Orbie Pyo 08/31/2020, 2:11 PM

## 2020-08-31 NOTE — Progress Notes (Signed)
Pharmacy: Dofetilide (Tikosyn) - Follow Up Assessment and Electrolyte Replacement  Pharmacy consulted to assist in monitoring and replacing electrolytes in this 75 y.o. male admitted on 08/28/2020 undergoing dofetilide initiation.   Labs:    Component Value Date/Time   K 4.1 08/31/2020 0300   MG 2.4 08/31/2020 0300     Plan: Potassium: K >/= 4: No additional supplementation needed  Magnesium: Mg > 2: No additional supplementation needed   Thank you for allowing pharmacy to participate in this patient's care   Hildred Laser, PharmD Clinical Pharmacist **Pharmacist phone directory can now be found on [email or web address] ([email or web address]).  Listed under [place].

## 2020-09-03 ENCOUNTER — Telehealth (HOSPITAL_COMMUNITY): Payer: Self-pay | Admitting: *Deleted

## 2020-09-03 MED ORDER — CARVEDILOL 3.125 MG PO TABS: 3.1250 mg | ORAL_TABLET | Freq: Two times a day (BID) | ORAL | 2 refills | Status: DC

## 2020-09-03 NOTE — Telephone Encounter (Signed)
Pt states he has been going in and out of afib since Saturday. Currently his HR is running in the 90s. Discussed with Adline Peals PA will try to restart coreg at 3.125mg  BID follow up on Thursday as scheduled. Pt is currently asymptomatic.

## 2020-09-04 NOTE — Progress Notes (Signed)
HPI male former smoker followed for OSA, complicated by CAD/stent, HBP, GERD NPSG 01/18/13 AHI  22/hr, moderate OSA, weight  195 lbs  ---------------------------------------------------------------------------.   09/06/19- 75 year old male former smoker followed for OSA, complicated by CAD/stent, PAFib, HBP, GERD/ HH,  CPAP auto 5-15/ [employer] Download compliance 100%, AHI 0.5/ hr Body weight today 187 lbs Tolerated difficult robotic repair of large hiatal hernia, with O2 discontinued before discharge. Breathing better now. CPAP "aggravating" but he knows it helps and is doing well. CT chest 11/16/18- IMPRESSION: 1. Large hiatal hernia which contains the entire stomach, mildly increased in size since previous study. No evidence of gastric volvulus or other complication. 2. Cholelithiasis. No radiographic evidence of cholecystitis. 3. Colonic diverticulosis. Aortic Atherosclerosis (ICD10-I70.0). Coronary artery Atherosclerosis.  09/05/20- 75 year old male former smoker followed for OSA, complicated by CAD/stent, PAFib, HBP, GERD/ HH,  CPAP auto 5-15/ [employer] [facility] -compliance 97%, AHi 1.6/ hr Body weight today -188 lbs Covid vax- Here with wife. CPAP doing well. We discussed alternatives and he is satisfied to continue w CPAP. Now on Tikosyn for Afib- says NSR yesterday, Afib today. Mainly felt as easier fatigue with exertion.  Breathing stable with no issues  ROS-see HPI   + = positive Constitutional:   No-   weight loss, night sweats, fevers, chills, fatigue, lassitude. HEENT:   No-  headaches, difficulty swallowing, tooth/dental problems, sore throat,       No-  sneezing, itching, ear ache, nasal congestion, post nasal drip,  CV:  No-   chest pain, orthopnea, PND, swelling in lower extremities, anasarca, dizziness, palpitations Resp: No-   shortness of breath with exertion or at rest.              No-   productive cough,  No non-productive cough,  No- coughing up of blood.               No-   change in color of mucus.  No- wheezing.   Skin: No-   rash or lesions. GI:  No-   heartburn, indigestion, abdominal pain, nausea, vomiting,  GU:  MS:  No-   joint pain or swelling.   Neuro-     nothing unusual Psych:  No- change in mood or affect. No depression or anxiety.  No memory loss.  OBJ- Physical Exam   stable baseline exam General- Alert, Oriented, Affect-appropriate, Distress- none acute. +Overweight Skin- rash-none, lesions- none, excoriation- none Lymphadenopathy- none Head- atraumatic            Eyes- Gross vision intact, PERRLA, conjunctivae and secretions clear            Ears- Hearing, canals-normal            Nose- Clear, no-Septal dev, mucus, polyps, erosion, perforation             Throat- Mallampati II-III , mucosa clear , drainage- none, tonsils- atrophic Neck- flexible , trachea midline, no stridor , thyroid nl, carotid no bruit Chest - symmetrical excursion , unlabored           Heart/CV- IRR at this exam , no murmur , no gallop  , no rub, nl s1 s2                           - JVD- none , edema- none, stasis changes- none, varices- none           Lung- clear to P&A, wheeze- none, cough- none , dullness-none,  rub- none           Chest wall-  Abd-  Br/ Gen/ Rectal- Not done, not indicated Extrem- cyanosis- none, clubbing, none, atrophy- none, strength- nl Neuro- grossly intact to observation

## 2020-09-05 ENCOUNTER — Other Ambulatory Visit: Payer: Self-pay

## 2020-09-05 ENCOUNTER — Ambulatory Visit: Payer: Medicare Other | Admitting: Internal Medicine

## 2020-09-05 ENCOUNTER — Encounter: Payer: Self-pay | Admitting: Internal Medicine

## 2020-09-05 DIAGNOSIS — E669 Obesity, unspecified: Secondary | ICD-10-CM

## 2020-09-05 DIAGNOSIS — G4733 Obstructive sleep apnea (adult) (pediatric): Secondary | ICD-10-CM | POA: Diagnosis not present

## 2020-09-05 DIAGNOSIS — I4819 Other persistent atrial fibrillation: Secondary | ICD-10-CM

## 2020-09-05 NOTE — Patient Instructions (Signed)
We can continue CPAP auto 5-15 ? ?Please cal if we can help ?

## 2020-09-05 NOTE — Assessment & Plan Note (Signed)
Benefits from CPAP and comfortable with current settings. Good compliance and control. Plan- continue auto 5-15

## 2020-09-05 NOTE — Assessment & Plan Note (Signed)
I believe he is in rate controlled AFib this morning. Followed by cardiology

## 2020-09-05 NOTE — Assessment & Plan Note (Signed)
Remains overweight.  Weight loss encouraged.

## 2020-09-06 ENCOUNTER — Encounter (HOSPITAL_COMMUNITY): Payer: Self-pay | Admitting: Nurse Practitioner

## 2020-09-06 ENCOUNTER — Ambulatory Visit (HOSPITAL_COMMUNITY)
Admit: 2020-09-06 | Discharge: 2020-09-06 | Disposition: A | Discharge status: Home / Self Care | Payer: Medicare Other | Attending: Nurse Practitioner | Admitting: Nurse Practitioner

## 2020-09-06 VITALS — BP 136/88 | HR 60 | Ht 67.0 in | Wt 188.8 lb

## 2020-09-06 DIAGNOSIS — I5042 Chronic combined systolic (congestive) and diastolic (congestive) heart failure: Secondary | ICD-10-CM | POA: Insufficient documentation

## 2020-09-06 DIAGNOSIS — Z7982 Long term (current) use of aspirin: Secondary | ICD-10-CM | POA: Diagnosis not present

## 2020-09-06 DIAGNOSIS — Z87891 Personal history of nicotine dependence: Secondary | ICD-10-CM | POA: Diagnosis not present

## 2020-09-06 DIAGNOSIS — D6869 Other thrombophilia: Secondary | ICD-10-CM

## 2020-09-06 DIAGNOSIS — E785 Hyperlipidemia, unspecified: Secondary | ICD-10-CM | POA: Diagnosis not present

## 2020-09-06 DIAGNOSIS — I4819 Other persistent atrial fibrillation: Secondary | ICD-10-CM | POA: Diagnosis present

## 2020-09-06 DIAGNOSIS — Z79899 Other long term (current) drug therapy: Secondary | ICD-10-CM | POA: Diagnosis not present

## 2020-09-06 DIAGNOSIS — I444 Left anterior fascicular block: Secondary | ICD-10-CM | POA: Insufficient documentation

## 2020-09-06 DIAGNOSIS — Z7902 Long term (current) use of antithrombotics/antiplatelets: Secondary | ICD-10-CM | POA: Insufficient documentation

## 2020-09-06 DIAGNOSIS — Z8616 Personal history of COVID-19: Secondary | ICD-10-CM | POA: Insufficient documentation

## 2020-09-06 DIAGNOSIS — I2510 Atherosclerotic heart disease of native coronary artery without angina pectoris: Secondary | ICD-10-CM | POA: Diagnosis not present

## 2020-09-06 DIAGNOSIS — Z7901 Long term (current) use of anticoagulants: Secondary | ICD-10-CM | POA: Diagnosis not present

## 2020-09-06 DIAGNOSIS — I110 Hypertensive heart disease with heart failure: Secondary | ICD-10-CM | POA: Diagnosis not present

## 2020-09-06 DIAGNOSIS — I480 Paroxysmal atrial fibrillation: Secondary | ICD-10-CM

## 2020-09-06 LAB — BASIC METABOLIC PANEL
Anion gap: 5 (ref 5–15)
BUN: 21 mg/dL (ref 8–23)
CO2: 27 mmol/L (ref 22–32)
Calcium: 8.8 mg/dL — ABNORMAL LOW (ref 8.9–10.3)
Chloride: 105 mmol/L (ref 98–111)
Creatinine, Ser: 1.1 mg/dL (ref 0.61–1.24)
GFR, Estimated: >60 mL/min (ref >60)
Glucose, Bld: 107 mg/dL — ABNORMAL HIGH (ref 70–99)
Potassium: 4.9 mmol/L (ref 3.5–5.1)
Sodium: 137 mmol/L (ref 135–145)

## 2020-09-06 LAB — MAGNESIUM: Magnesium: 2.2 mg/dL (ref 1.7–2.4)
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
## 2020-09-15 IMAGING — CR DG CHEST 2V
2 series · 2 of 2 positions shown · non-contrast
Comparison: 11/05/2012

CLINICAL DATA: Dyspnea and tachycardia with nausea x1 day.

EXAM:
CHEST - 2 VIEW

[chest pa]
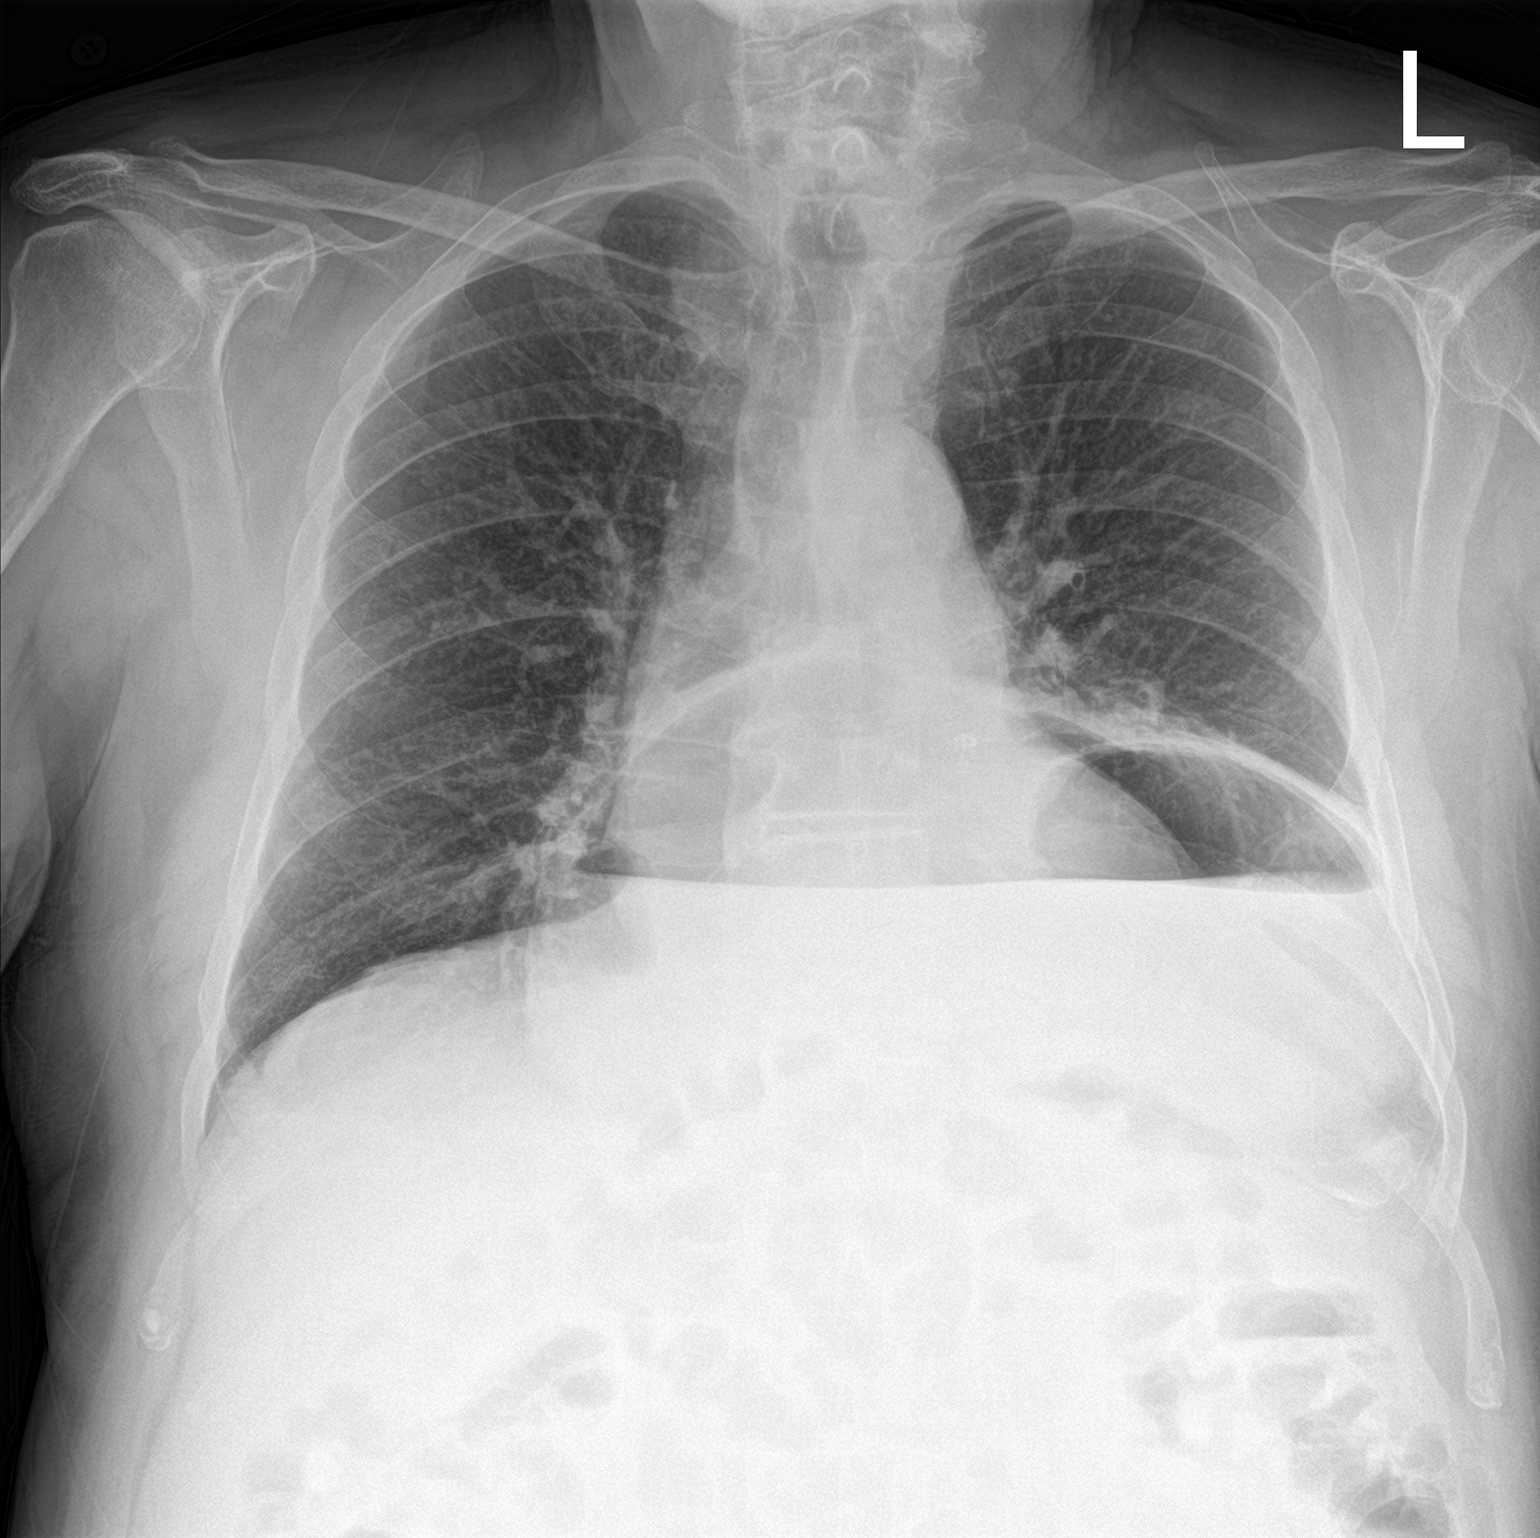

[chest lat]
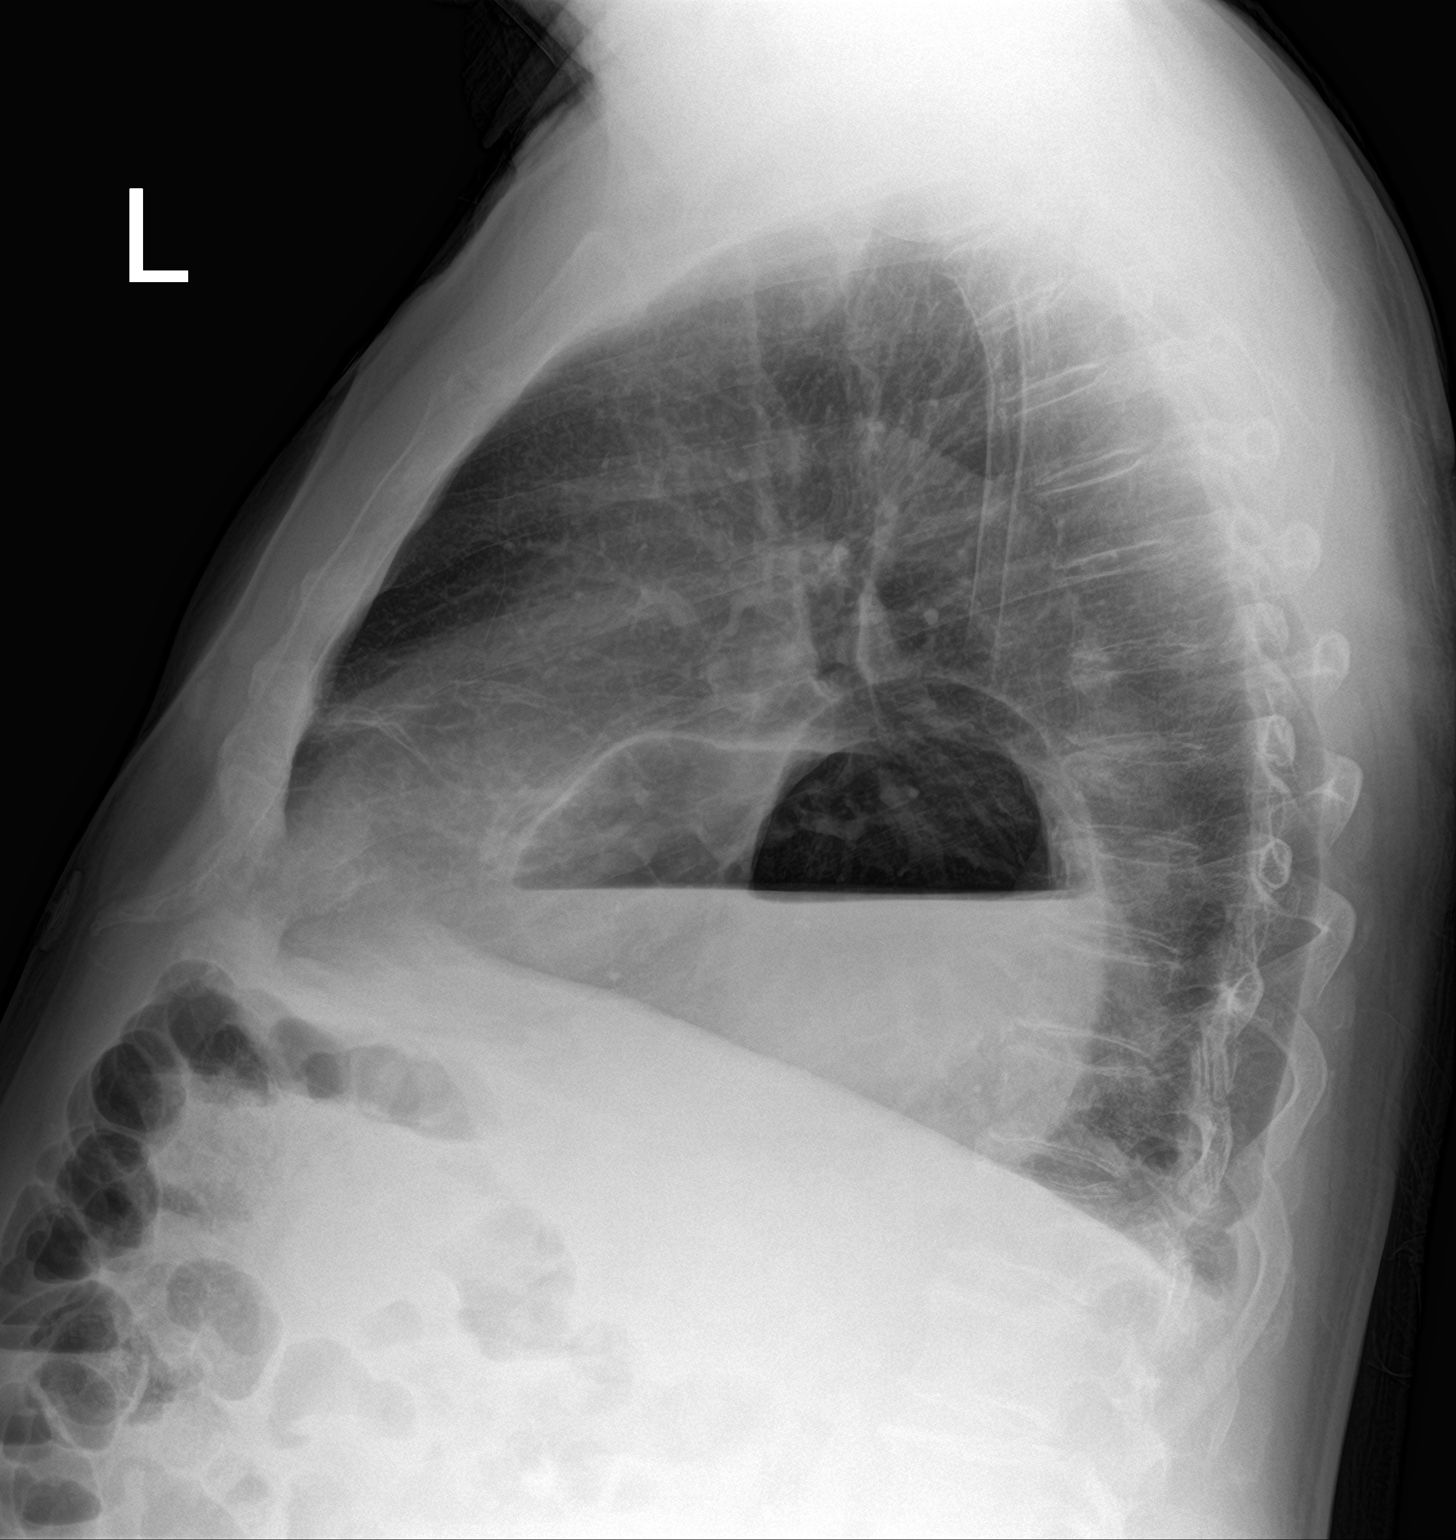

[2 of 2 positions shown; findings below may reference images not displayed]

FINDINGS: Top-normal heart size with slight uncoiling of the thoracic aorta.
No acute pulmonary consolidation. Left basilar atelectasis is noted.
Redemonstration of large hiatal hernia containing and air-fluid
level, unchanged in appearance. No effusion or pneumothorax. No
acute osseous abnormality.
IMPRESSION: Large hiatal hernia. No active pulmonary disease.

## 2020-10-05 ENCOUNTER — Other Ambulatory Visit: Payer: Self-pay

## 2020-10-05 ENCOUNTER — Ambulatory Visit: Payer: Medicare Other | Admitting: Internal Medicine

## 2020-10-05 ENCOUNTER — Encounter: Payer: Self-pay | Admitting: Internal Medicine

## 2020-10-05 VITALS — BP 124/80 | HR 57 | Ht 67.0 in | Wt 187.0 lb

## 2020-10-05 DIAGNOSIS — I10 Essential (primary) hypertension: Secondary | ICD-10-CM | POA: Diagnosis not present

## 2020-10-05 DIAGNOSIS — I2510 Atherosclerotic heart disease of native coronary artery without angina pectoris: Secondary | ICD-10-CM | POA: Diagnosis not present

## 2020-10-05 DIAGNOSIS — I4819 Other persistent atrial fibrillation: Secondary | ICD-10-CM

## 2020-10-05 NOTE — Patient Instructions (Addendum)
Medication Instructions:  Your physician recommends that you continue on your current medications as directed. Please refer to the Current Medication list given to you today.  Labwork: None ordered.  Testing/Procedures: None ordered.  Follow-Up: Your physician wants you to follow-up in: 3 months with th AF Clinic. They will contact you to schedule.    Any Other Special Instructions Will Be Listed Below (If Applicable).  If you need a refill on your cardiac medications before your next appointment, please call your pharmacy.
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
## 2020-11-12 ENCOUNTER — Other Ambulatory Visit (HOSPITAL_COMMUNITY): Payer: Self-pay | Admitting: Physician Assistant

## 2021-01-14 ENCOUNTER — Ambulatory Visit (HOSPITAL_COMMUNITY)
Admission: RE | Admit: 2021-01-14 | Discharge: 2021-01-14 | Disposition: A | Discharge status: Home / Self Care | Payer: Medicare Other | Source: Ambulatory Visit | Attending: Nurse Practitioner | Admitting: Nurse Practitioner

## 2021-01-14 ENCOUNTER — Encounter (HOSPITAL_COMMUNITY): Payer: Self-pay | Admitting: Nurse Practitioner

## 2021-01-14 ENCOUNTER — Other Ambulatory Visit: Payer: Self-pay

## 2021-01-14 VITALS — BP 124/76 | HR 57 | Ht 67.0 in | Wt 203.2 lb

## 2021-01-14 DIAGNOSIS — R609 Edema, unspecified: Secondary | ICD-10-CM

## 2021-01-14 DIAGNOSIS — R600 Localized edema: Secondary | ICD-10-CM | POA: Insufficient documentation

## 2021-01-14 DIAGNOSIS — I2510 Atherosclerotic heart disease of native coronary artery without angina pectoris: Secondary | ICD-10-CM | POA: Insufficient documentation

## 2021-01-14 DIAGNOSIS — D6869 Other thrombophilia: Secondary | ICD-10-CM | POA: Diagnosis not present

## 2021-01-14 DIAGNOSIS — Z79899 Other long term (current) drug therapy: Secondary | ICD-10-CM | POA: Diagnosis not present

## 2021-01-14 DIAGNOSIS — Z7901 Long term (current) use of anticoagulants: Secondary | ICD-10-CM | POA: Diagnosis not present

## 2021-01-14 DIAGNOSIS — Z9989 Dependence on other enabling machines and devices: Secondary | ICD-10-CM | POA: Insufficient documentation

## 2021-01-14 DIAGNOSIS — I4819 Other persistent atrial fibrillation: Secondary | ICD-10-CM | POA: Diagnosis present

## 2021-01-14 DIAGNOSIS — G4733 Obstructive sleep apnea (adult) (pediatric): Secondary | ICD-10-CM | POA: Insufficient documentation

## 2021-01-14 DIAGNOSIS — Z7982 Long term (current) use of aspirin: Secondary | ICD-10-CM | POA: Diagnosis not present

## 2021-01-14 DIAGNOSIS — Z955 Presence of coronary angioplasty implant and graft: Secondary | ICD-10-CM | POA: Insufficient documentation

## 2021-01-14 DIAGNOSIS — Z87891 Personal history of nicotine dependence: Secondary | ICD-10-CM | POA: Diagnosis not present

## 2021-01-14 LAB — BASIC METABOLIC PANEL
Anion gap: 9 (ref 5–15)
BUN: 18 mg/dL (ref 8–23)
CO2: 27 mmol/L (ref 22–32)
Calcium: 8.6 mg/dL — ABNORMAL LOW (ref 8.9–10.3)
Chloride: 101 mmol/L (ref 98–111)
Creatinine, Ser: 1.12 mg/dL (ref 0.61–1.24)
GFR, Estimated: >60 mL/min (ref >60)
Glucose, Bld: 121 mg/dL — ABNORMAL HIGH (ref 70–99)
Potassium: 4.6 mmol/L (ref 3.5–5.1)
Sodium: 137 mmol/L (ref 135–145)

## 2021-01-14 LAB — MAGNESIUM: Magnesium: 2.1 mg/dL (ref 1.7–2.4)

## 2021-01-14 NOTE — Progress Notes (Signed)
Primary Care Physician: [name], MD Referring Physician: ER f/u  Cardiologist: Dr. Adriana Mccallum Douglas [name] is a 75 y.o. male with a h/o CAD and presented to the ER 04/18/18, with 2 hours of palpitations and found to be in new onset afib, rate controlled. He self converted in the ER. Since he was already on asa/plavix for CAD, he was not started on anticoagulation. He was referred to the afib clinic for further evaluation. He was already on BB. He did not drink alcohol, no caffeine, does have OSA and uses CPAP religiously. Walks for exercise.  He wore a heart monitor that showed no afib.  He is presumed to have had Covid in February after wife had mild symptoms and tested positive  His PCR was negative, but tested  early in the course of his symptoms. At that  time he developed afib. He saw Dr. Oval Linsey and was started on anticoagulation for 3 weeks and cardioverted. He had a successful cardioversion but had ERAF. He was referred back to the afib clinic.   We discussed today means of restoring SR. His two options are amiodarone or Tikosyn. Flecainide contraindicated due to CAD.  After full discussion on risk vrs benefit of both drugs, he has decided to schedule a Tikosyn admit. Qt acceptable, not does appear to have any contraindicated drugs. Rare benadryl use. So far, he is tolerating Afib. He worked in the yard yesterday for many hours and felt ok with this activity. He is rate controlled in the office.   F/u in afib clinic, 5/10, for Tikosyn admit. No missed anticoagulation. No benadryl use. Marland Kitchen He is currently tolerating rate controlled afib ok, not so much when he first developed afib. QT interval  is good at 417 ms.  He will use his insurance to get his med initially, but then plans to get it through the [place].  F/u in afib clinic, 5/19,  for recent loading of tikosyn.  He converted on drug and did not require CV. He is in SR today but has had frequent breakthrough episodes of  afib since start of tikosyn. He has a Investment banker, operational to confirm. He usually is not aware of the start of Afib but knows when he goes back in rhythm as he  feels lightheaded for a few seconds.   F/u in afib clinic, 01/14/21. He remains on tikosyn. He is in sinus brady at 55 bpm. He has been staying in rhythm, no afib appreciated. Qt stable. [place] and his weight is up 12 lbs. He takes lasix prn. He did take it yesterday. He has mild ankle swelling. No dyspnea. Compliant with Tikosyn and eliquis for a CHA2DS2VASc score of 4.   Today, he denies symptoms of palpitations, chest pain, shortness of breath, orthopnea, PND, lower extremity edema, dizziness, presyncope, syncope, or neurologic sequela. The patient is tolerating medications without difficulties and is otherwise without complaint today.   Past Medical History:  Diagnosis Date   Acute on chronic diastolic heart failure ([place]) 01/11/2020   Anemia    Arthritis    "joints pains; comes w/age" (05/09/2014)   Chronic combined systolic and diastolic heart failure ([place]) 01/11/2020   Coronary artery disease    Essential hypertension 01/11/2020   GERD (gastroesophageal reflux disease)    H/O hiatal hernia 1980   Heart attack ([name]) 10/29/05   anteroseptal myocardial infaction Taxus stent to LAD   History of blood transfusion 1980?   "  while hospitalized w/kidney stones, tore my hiatal hernia"   History of gout    History of kidney stones    Hyperlipidemia    Hypertension    Ischemic heart disease    Lower extremity edema 08/23/2015   OSA on CPAP    Pneumonia    Skin cancer    "burnt most of them off; face, arms"   Past Surgical History:  Procedure Laterality Date   CARDIOVASCULAR STRESS TEST Left 05/28/2009   EF 57% no reversible ischemia   CARDIOVERSION N/A 07/12/2020   Procedure: CARDIOVERSION;  Surgeon: Donato Heinz, MD;  Location: [facility];  Service: Cardiovascular;  Laterality: N/A;   CATARACT  EXTRACTION W/ INTRAOCULAR LENS IMPLANT Left [place] Bilateral 2007   "1"   CORONARY STENT INTERVENTION N/A 05/11/2018   Procedure: CORONARY STENT INTERVENTION;  Surgeon: Leonie Man, MD;  Location: [facility];  Service: Cardiovascular;  Laterality: N/A;   DOPPLER ECHOCARDIOGRAPHY  01/06/2007   ESOPHAGEAL MANOMETRY N/A 01/05/2019   Procedure: ESOPHAGEAL MANOMETRY (EM);  Surgeon: Laurence Spates, MD;  Location: [facility];  Service: Endoscopy;  Laterality: N/A;   EYE SURGERY Left 1978   "injury"   HERNIA REPAIR     INGUINAL HERNIA REPAIR Bilateral 05/09/2014   INGUINAL HERNIA REPAIR Bilateral 05/09/2014   Procedure: LAPAROSCOPIC BILATERAL INGUINAL HERNIA REPAIR;  Surgeon: [name], MD;  Location: [facility];  Service: General;  Laterality: Bilateral;   INSERTION OF MESH N/A 05/09/2014   Procedure: INSERTION OF MESH;  Surgeon: [name], MD;  Location: [facility];  Service: General;  Laterality: N/A;  INSERTION OF MESH TO ABDOMEN AND BILATERAL GROIN   LEFT HEART CATH AND CORONARY ANGIOGRAPHY N/A 05/11/2018   Procedure: LEFT HEART CATH AND CORONARY ANGIOGRAPHY;  Surgeon: Leonie Man, MD;  Location: [facility];  Service: Cardiovascular;  Laterality: N/A;   SHOULDER ARTHROSCOPY W/ ROTATOR CUFF REPAIR Right 06/2010   SKIN CANCER EXCISION Left    "arm"   TEE WITHOUT CARDIOVERSION N/A 07/12/2020   Procedure: TRANSESOPHAGEAL ECHOCARDIOGRAM (TEE);  Surgeon: Donato Heinz, MD;  Location: [facility];  Service: Cardiovascular;  Laterality: N/A;   TONSILLECTOMY  3335   UMBILICAL HERNIA REPAIR  4/56/2563   UMBILICAL HERNIA REPAIR N/A 05/09/2014   Procedure: HERNIA REPAIR UMBILICAL ADULT;  Surgeon: [name], MD;  Location: [facility];  Service: General;  Laterality: N/A;    Current Outpatient Medications  Medication Sig Dispense Refill   acetaminophen (TYLENOL) 500 MG tablet Take 500-1,000 mg by mouth every 6 (six) hours as needed (for  pain.).     amLODipine (NORVASC) 5 MG tablet Take 5 mg by mouth daily.     apixaban (ELIQUIS) 5 MG TABS tablet Take 1 tablet (5 mg total) by mouth 2 (two) times daily. 60 tablet 5   carvedilol (COREG) 3.125 MG tablet TAKE 1 TABLET BY MOUTH TWICE DAILY WITH A MEAL 60 tablet 6   Cholecalciferol (VITAMIN D) 2000 UNITS tablet Take 2,000 Units by mouth daily.     colchicine 0.6 MG tablet Take 0.6 mg by mouth 2 (two) times daily as needed (gout). Colcrys     dofetilide (TIKOSYN) 500 MCG capsule Take 1 capsule (500 mcg total) by mouth 2 (two) times daily. 60 capsule 0   EPINEPHrine 0.3 mg/0.3 mL IJ SOAJ injection Inject 0.3 mg into the muscle as needed for allergies.     fexofenadine (ALLEGRA) 180 MG tablet Take 180 mg by mouth  daily as needed for allergies.     furosemide (LASIX) 40 MG tablet TAKE 1 TABLET BY MOUTH DAILY AS NEEDED FOR FLUID OR EDEMA (Patient taking differently: Take 40 mg by mouth as needed.) 90 tablet 3   nitroGLYCERIN (NITROSTAT) 0.4 MG SL tablet Place 0.4 mg under the tongue every 5 (five) minutes as needed for chest pain.     omeprazole (PRILOSEC) 20 MG capsule Take 20 mg by mouth daily.     ondansetron (ZOFRAN) 4 MG tablet Take 4 mg by mouth as needed.     simvastatin (ZOCOR) 20 MG tablet TAKE 1 TABLET BY MOUTH EVERYDAY AT BEDTIME (Patient taking differently: Take 20 mg by mouth daily.) 30 tablet 2   valACYclovir (VALTREX) 1000 MG tablet Take 1,000 mg by mouth 2 (two) times daily as needed (fever blisters).     No current facility-administered medications for this encounter.    Allergies  Allergen Reactions   Ace Inhibitors Cough   Bee Venom     Other reaction(s): swelling   Lisinopril Cough   Losartan Cough   Other Swelling    Social History   Socioeconomic History   Marital status: Married    Spouse name: Tiyon Sanor   Number of children: 1   Years of education: Not on file   Highest education level: Not on file  Occupational History   Not on file  Tobacco  Use   Smoking status: Former    Packs/day: 0.50    Years: 10.00    Pack years: 5.00    Types: Cigarettes    Quit date: 04/21/1968    Years since quitting: 52.7   Smokeless tobacco: Former    Types: Chew    Quit date: 04/21/1978  Vaping Use   Vaping Use: Never used  Substance and Sexual Activity   Alcohol use: Not Currently    Comment: 05/09/2014 "stopped drinking in ~ 2007; never drank much"   Drug use: No   Sexual activity: Yes  Other Topics Concern   Not on file  Social History Narrative   Not on file   Social Determinants of Health   Financial Resource Strain: Not on file  Food Insecurity: Not on file  Transportation Needs: Not on file  Physical Activity: Not on file  Stress: Not on file  Social Connections: Not on file  Intimate Partner Violence: Not on file    Family History  Problem Relation Age of Onset   Heart disease Mother    Diabetes Mother    Heart disease Father    Diabetes Father     ROS- All systems are reviewed and negative except as per the HPI above  Physical Exam: Vitals:   01/14/21 0831  Weight: 92.2 kg  Height: 5\' 7"  (1.702 m)   Wt Readings from Last 3 Encounters:  01/14/21 92.2 kg  10/05/20 84.8 kg  09/06/20 85.6 kg    Labs: Lab Results  Component Value Date   NA 137 09/06/2020   K 4.9 09/06/2020   CL 105 09/06/2020   CO2 27 09/06/2020   GLUCOSE 107 (H) 09/06/2020   BUN 21 09/06/2020   CREATININE 1.10 09/06/2020   CALCIUM 8.8 (L) 09/06/2020   MG 2.2 09/06/2020   No results found for: INR Lab Results  Component Value Date   CHOL 124 01/17/2019   HDL 37 (L) 01/17/2019   LDLCALC 70 01/17/2019   TRIG 89 01/17/2019     GEN- The patient is well appearing, alert and oriented  x 3 today.   Head- normocephalic, atraumatic Eyes-  Sclera clear, conjunctiva pink Ears- hearing intact Oropharynx- clear Neck- supple, no JVP Lymph- no cervical lymphadenopathy Lungs- Clear to ausculation bilaterally, normal work of breathing Heart  -regular rate and rhythm, no murmurs, rubs or gallops, PMI not laterally displaced GI- soft, NT, ND, + BS Extremities- no clubbing, cyanosis, or edema MS- no significant deformity or atrophy Skin- no rash or lesion Psych- euthymic mood, full affect Neuro- strength and sensation are intact  EKG  NSR at 60 bpm,  pr int 196 ms, qrs int 82 ms, qtc 428 ms  Epic records reviewed ER records reviewed  TEE echo- 07/12/20- 1. Left ventricular ejection fraction, by estimation, is 45 to 50%. The  left ventricle has mildly decreased function. The left ventricle  demonstrates regional wall motion abnormalities. Apical hypokinesis.   2. Right ventricular systolic function is normal. The right ventricular  size is mildly enlarged.   3. The mitral valve is normal in structure. Trivial mitral valve  regurgitation.   4. The aortic valve is tricuspid. Aortic valve regurgitation is not  visualized.   5. Spontaneous echo contrast present, but no left atrial/left atrial  appendage thrombus was detected. Definity was administered with showed  filling of the appendage.    Assessment and Plan: 1.Presisitent  afib Successful cardioversion 07/12/20 but ERAF 2 weeks later  Tikosyn admit in May  In SR today, no afib appreciated    Continue carvedilol 3.125 mg bid  qtc in acceptable range  Bmet/mag today   2. CHA2DS2VASc score of at least 5 Contiue  eliquis 5 mg bid, no missed doses   3. Increased weight/mild pedal [place] Did not show any salt discretion  He will take 20 mg lasix in am for the next 3 days He will call the office with his weight after that If weight is still up,consider increasing lasix to 40 mg daily for a few more days  He was encouraged to weigh daily.   F/u with afib clinic in 3 months     Golden. Berenis Corter, [facility] [address] [place], [place] [address] [phone]

## 2021-01-14 NOTE — Patient Instructions (Signed)
Lasix 20mg  today and tomorrow - call on Wednesday if no reduction in weight/swelling

## 2021-02-14 ENCOUNTER — Encounter (HOSPITAL_BASED_OUTPATIENT_CLINIC_OR_DEPARTMENT_OTHER): Payer: Self-pay | Admitting: Cardiovascular Disease

## 2021-02-14 ENCOUNTER — Ambulatory Visit (HOSPITAL_BASED_OUTPATIENT_CLINIC_OR_DEPARTMENT_OTHER): Payer: Medicare Other | Admitting: Cardiovascular Disease

## 2021-02-14 ENCOUNTER — Other Ambulatory Visit: Payer: Self-pay

## 2021-02-14 VITALS — BP 152/88 | HR 58 | Ht 67.0 in | Wt 205.8 lb

## 2021-02-14 DIAGNOSIS — E785 Hyperlipidemia, unspecified: Secondary | ICD-10-CM | POA: Diagnosis not present

## 2021-02-14 DIAGNOSIS — I10 Essential (primary) hypertension: Secondary | ICD-10-CM

## 2021-02-14 DIAGNOSIS — I2510 Atherosclerotic heart disease of native coronary artery without angina pectoris: Secondary | ICD-10-CM

## 2021-02-14 DIAGNOSIS — Z955 Presence of coronary angioplasty implant and graft: Secondary | ICD-10-CM | POA: Insufficient documentation

## 2021-02-14 DIAGNOSIS — I255 Ischemic cardiomyopathy: Secondary | ICD-10-CM

## 2021-02-14 DIAGNOSIS — I4819 Other persistent atrial fibrillation: Secondary | ICD-10-CM

## 2021-02-14 DIAGNOSIS — Z951 Presence of aortocoronary bypass graft: Secondary | ICD-10-CM

## 2021-02-14 DIAGNOSIS — G4733 Obstructive sleep apnea (adult) (pediatric): Secondary | ICD-10-CM

## 2021-02-14 HISTORY — DX: Presence of aortocoronary bypass graft: Z95.1

## 2021-02-14 HISTORY — DX: Presence of coronary angioplasty implant and graft: Z95.5

## 2021-02-14 LAB — COMPREHENSIVE METABOLIC PANEL
ALT: 13 IU/L (ref 0–44)
AST: 17 IU/L (ref 0–40)
Albumin/Globulin Ratio: 2.2 (ref 1.2–2.2)
Albumin: 4.3 g/dL (ref 3.7–4.7)
Alkaline Phosphatase: 73 IU/L (ref 44–121)
BUN/Creatinine Ratio: 14 (ref 10–24)
BUN: 15 mg/dL (ref 8–27)
Bilirubin Total: 0.5 mg/dL (ref 0.0–1.2)
CO2: 24 mmol/L (ref 20–29)
Calcium: 9.5 mg/dL (ref 8.6–10.2)
Chloride: 99 mmol/L (ref 96–106)
Creatinine, Ser: 1.11 mg/dL (ref 0.76–1.27)
Globulin, Total: 2 g/dL (ref 1.5–4.5)
Glucose: 97 mg/dL (ref 70–99)
Potassium: 4.6 mmol/L (ref 3.5–5.2)
Sodium: 136 mmol/L (ref 134–144)
Total Protein: 6.3 g/dL (ref 6.0–8.5)
eGFR: 69 mL/min/{1.73_m2} (ref >59)

## 2021-02-14 LAB — LIPID PANEL
Chol/HDL Ratio: 3.8 ratio (ref 0.0–5.0)
Cholesterol, Total: 114 mg/dL (ref 100–199)
HDL: 30 mg/dL — ABNORMAL LOW (ref >39)
LDL Chol Calc (NIH): 66 mg/dL (ref 0–99)
Triglycerides: 96 mg/dL (ref 0–149)
VLDL Cholesterol Cal: 18 mg/dL (ref 5–40)

## 2021-02-14 NOTE — Assessment & Plan Note (Signed)
Continue CPAP.  

## 2021-02-14 NOTE — Assessment & Plan Note (Signed)
Lipids are not at goal.  Repeat lipids/CMP today.  We will need to increase simvastatin or change statins if LDL is >70.  He will work on diet and exercise.  Recommend at least 150 minutes weekly.

## 2021-02-14 NOTE — Patient Instructions (Signed)
Medication Instructions:  Continue current medication  *If you need a refill on your cardiac medications before your next appointment, please call your pharmacy*   Lab Work: Fasting Lipid and CMP  If you have labs (blood work) drawn today and your tests are completely normal, you will receive your results only by: [place] (if you have MyChart) OR A paper copy in the mail If you have any lab test that is abnormal or we need to change your treatment, we will call you to review the results.   Testing/Procedures: None Ordered   Follow-Up: At [facility], you and your health needs are our priority.  As part of our continuing mission to provide you with exceptional heart care, we have created designated Provider Care Teams.  These Care Teams include your primary Cardiologist (physician) and Advanced Practice Providers (APPs -  Physician Assistants and Nurse Practitioners) who all work together to provide you with the care you need, when you need it.  We recommend signing up for the patient portal called "MyChart".  Sign up information is provided on this After Visit Summary.  MyChart is used to connect with patients for Virtual Visits (Telemedicine).  Patients are able to view lab/test results, encounter notes, upcoming appointments, etc.  Non-urgent messages can be sent to your provider as well.   To learn more about what you can do with MyChart, go to [email or web address].    Your next appointment:   6 month(s)  The format for your next appointment:   In Person  Provider:   [name], MD

## 2021-02-14 NOTE — Assessment & Plan Note (Signed)
LVEF 45 to 50%.  He has mild lower extremity edema.  He is going to take some Lasix today.  His lungs are clear and he does not have any heart failure symptoms.  Continue carvedilol.  He has an ACE-I/ARB allergy.  Consider Jardiance/Farxiga.

## 2021-02-14 NOTE — Progress Notes (Signed)
Cardiology Office Note   Date:  02/14/2021   ID:  Douglas [name] 23-Aug-1945, MRN [id number]  PCP:  [name], MD  Cardiologist:   [name], MD   No chief complaint on file.    History of Present Illness: Douglas [name] is a 75 y.o. male with CAD s/p MI and PCI (LAD 2007, RCA 2020), PAF, hypertension, hyperlipidemia, chronic occult GI bleed, and OSA on CPAP who presents for follow up.  Douglas [name] was previously a patient of [name].  He had an anterior MI 10/2005.  He had nuclear stress tests 05/2009 and 10/2012 that were negative for ischemia.  At previous appointments carvedilol was reduced due to bradycardia. He has been doing well recently. He denies any chest pain, lightheadedness, or dizziness. He has mild shortness of breath with exertion that is unchanged from baseline. He denies any lower extremity edema, orthopnea, or PND. He has not needed to take any Lasix since his last appointment.  Douglas [name] has not been exercising regularly.  He does work in his garden and he works with 6 horses.  He denies exertional symptoms with these activities.    Douglas [name] reported dizziness and his heart rate was noted to be low.  He was referred for an event monitor that showed 5 beats of NSVT.  He was referred for an echo 05/04/2018 that revealed LVEF 45 to 50% with hypokinesis of the apical myocardium.  He also had grade 1 diastolic dysfunction.  He then underwent left heart catheterization 04/2018 that revealed a 75% proximal RCA lesion.  A drug-eluting stent was placed.  His prior LAD stent was patent.  His hiatal hernia was repaired 03/2019.  Following that, his blood pressure was running low.  This was thought to be due to his 30 pound weight loss after hiatal hernia surgery.  However he was asymptomatic and did not want to lower his antihypertensives. His amlodipine was subsequently reduced due to hypotension.   At his last appointment 06/2020 he was found  to be in new onset atrial fibrillation. He was started on Eliquis. He had a TEE cardioversion at which time his LVEF was 45-50% with apical hypokinesis. He followed up in atrial fibrillation clinic 07/2020 due to recurrent atrial fibrillation. He was started on Tikosyn 08/2020. He converted to sinus rhythm and did not require cardioversion. Today, he is accompanied by a family member. In the mornings, most of the time he is in Afib according to his [employer] [facility] [employer] mobile. His heart rate is typically in the high 80s to 105 bpm at these times. He took Tikosyn at 7:00 AM, and had used his Jodelle Red prior to taking his medication. Usually he is not able to feel when he is in atrial fibrillation. He denies feeling any symptoms similar to how he felt in 06/2020 with his new onset atrial fibrillation. At home his blood pressure was 118/73 this morning, which he notes is typical for him. Lately he has been trying to stay active, occasionally still works appliance calls. However he has not been participating in much formal exercise. A few times he has needed to take Lasix for edema. He denies any palpitations, chest pain, or shortness of breath. No lightheadedness, headaches, syncope, orthopnea, PND, or exertional symptoms. Currently he is fasting.   Past Medical History:  Diagnosis Date   Acute on chronic diastolic heart failure ([place]) 01/11/2020   Anemia    Arthritis    "joints pains; comes w/age" (05/09/2014)  Chronic combined systolic and diastolic heart failure ([place]) 01/11/2020   Coronary artery disease    Essential hypertension 01/11/2020   GERD (gastroesophageal reflux disease)    H/O hiatal hernia 1980   Heart attack ([place]) 10/29/05   anteroseptal myocardial infaction Taxus stent to LAD   History of blood transfusion 1980?   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   Hyperlipidemia    Hypertension    Ischemic heart disease     Lower extremity edema 08/23/2015   OSA on CPAP    Pneumonia    S/P coronary artery stent placement 02/14/2021   Skin cancer    "burnt most of them off; face, arms"    Past Surgical History:  Procedure Laterality Date   CARDIOVASCULAR STRESS TEST Left 05/28/2009   EF 57% no reversible ischemia   CARDIOVERSION N/A 07/12/2020   Procedure: CARDIOVERSION;  Surgeon: Donato Heinz, MD;  Location: [facility];  Service: Cardiovascular;  Laterality: N/A;   CATARACT EXTRACTION W/ INTRAOCULAR LENS IMPLANT Left [place] Bilateral 2007   "1"   CORONARY STENT INTERVENTION N/A 05/11/2018   Procedure: CORONARY STENT INTERVENTION;  Surgeon: Leonie Man, MD;  Location: [facility];  Service: Cardiovascular;  Laterality: N/A;   DOPPLER ECHOCARDIOGRAPHY  01/06/2007   ESOPHAGEAL MANOMETRY N/A 01/05/2019   Procedure: ESOPHAGEAL MANOMETRY (EM);  Surgeon: Laurence Spates, MD;  Location: [facility];  Service: Endoscopy;  Laterality: N/A;   EYE SURGERY Left 1978   "injury"   HERNIA REPAIR     INGUINAL HERNIA REPAIR Bilateral 05/09/2014   INGUINAL HERNIA REPAIR Bilateral 05/09/2014   Procedure: LAPAROSCOPIC BILATERAL INGUINAL HERNIA REPAIR;  Surgeon: [name], MD;  Location: [facility];  Service: General;  Laterality: Bilateral;   INSERTION OF MESH N/A 05/09/2014   Procedure: INSERTION OF MESH;  Surgeon: [name], MD;  Location: [facility];  Service: General;  Laterality: N/A;  INSERTION OF MESH TO ABDOMEN AND BILATERAL GROIN   LEFT HEART CATH AND CORONARY ANGIOGRAPHY N/A 05/11/2018   Procedure: LEFT HEART CATH AND CORONARY ANGIOGRAPHY;  Surgeon: Leonie Man, MD;  Location: [facility];  Service: Cardiovascular;  Laterality: N/A;   SHOULDER ARTHROSCOPY W/ ROTATOR CUFF REPAIR Right 06/2010   SKIN CANCER EXCISION Left    "arm"   TEE WITHOUT CARDIOVERSION N/A 07/12/2020   Procedure: TRANSESOPHAGEAL ECHOCARDIOGRAM (TEE);  Surgeon: Donato Heinz, MD;  Location: [facility];  Service: Cardiovascular;  Laterality: N/A;   TONSILLECTOMY  4098   UMBILICAL HERNIA REPAIR  05/09/1476   UMBILICAL HERNIA REPAIR N/A 05/09/2014   Procedure: HERNIA REPAIR UMBILICAL ADULT;  Surgeon: [name], MD;  Location: [facility];  Service: General;  Laterality: N/A;     Current Outpatient Medications  Medication Sig Dispense Refill   acetaminophen (TYLENOL) 500 MG tablet Take 500-1,000 mg by mouth every 6 (six) hours as needed (for pain.).     amLODipine (NORVASC) 5 MG tablet Take 5 mg by mouth daily.     apixaban (ELIQUIS) 5 MG TABS tablet Take 1 tablet (5 mg total) by mouth 2 (two) times daily. 60 tablet 5   carvedilol (COREG) 3.125 MG tablet TAKE 1 TABLET BY MOUTH TWICE DAILY WITH A MEAL 60 tablet 6   Cholecalciferol (VITAMIN D) 2000 UNITS tablet Take 2,000 Units by mouth daily.     colchicine  0.6 MG tablet Take 0.6 mg by mouth 2 (two) times daily as needed (gout). Colcrys     dofetilide (TIKOSYN) 500 MCG capsule Take 1 capsule (500 mcg total) by mouth 2 (two) times daily. 60 capsule 0   EPINEPHrine 0.3 mg/0.3 mL IJ SOAJ injection Inject 0.3 mg into the muscle as needed for allergies.     fexofenadine (ALLEGRA) 180 MG tablet Take 180 mg by mouth daily as needed for allergies.     furosemide (LASIX) 40 MG tablet TAKE 1 TABLET BY MOUTH DAILY AS NEEDED FOR FLUID OR EDEMA (Patient taking differently: Take 40 mg by mouth as needed.) 90 tablet 3   nitroGLYCERIN (NITROSTAT) 0.4 MG SL tablet Place 0.4 mg under the tongue every 5 (five) minutes as needed for chest pain.     omeprazole (PRILOSEC) 20 MG capsule Take 20 mg by mouth daily.     ondansetron (ZOFRAN) 4 MG tablet Take 4 mg by mouth as needed.     simvastatin (ZOCOR) 20 MG tablet TAKE 1 TABLET BY MOUTH EVERYDAY AT BEDTIME (Patient taking differently: Take 20 mg by mouth daily.) 30 tablet 2   valACYclovir (VALTREX) 1000 MG tablet Take 1,000 mg by mouth 2 (two) times daily as needed (fever blisters).      No current facility-administered medications for this visit.    Allergies:   Ace inhibitors, Bee venom, Lisinopril, Losartan, and Other    Social History:  The patient  reports that he quit smoking about 52 years ago. His smoking use included cigarettes. He has a 5.00 pack-year smoking history. He quit smokeless tobacco use about 42 years ago.  His smokeless tobacco use included chew. He reports that he does not currently use alcohol. He reports that he does not use drugs.   Family History:  The patient's family history includes Diabetes in his father and mother; Heart disease in his father and mother.    ROS:   Please see the history of present illness. All other systems are reviewed and negative.    PHYSICAL EXAM: VS:  BP (!) 152/88 (BP Location: Left Arm, Patient Position: Sitting, Cuff Size: Large)   Pulse (!) 58   Ht '5\' 7"'  (1.702 m)   Wt 205 lb 12.8 oz (93.4 kg)   BMI 32.23 kg/m  , BMI Body mass index is 32.23 kg/m. GENERAL:  Well appearing HEENT: Pupils equal round and reactive, fundi not visualized, oral mucosa unremarkable NECK:  No jugular venous distention, waveform within normal limits, carotid upstroke brisk and symmetric, no bruits LUNGS:  Clear to auscultation bilaterally HEART:  RRR.   PMI not displaced or sustained,S1 and S2 within normal limits, no S3, no S4, no clicks, no rubs, no murmurs ABD:  Flat, positive bowel sounds normal in frequency in pitch, no bruits, no rebound, no guarding, no midline pulsatile mass, no hepatomegaly, no splenomegaly EXT:  2 plus pulses throughout, 1+ LE pitting edema, no cyanosis no clubbing SKIN:  No rashes no nodules NEURO:  Cranial nerves II through XII grossly intact, motor grossly intact throughout PSYCH:  Cognitively intact, oriented to person place and time  EKG:   12/27/15: Sinus bradycardia.  Rate 49 bpm.  Prior septal infarct.  Prior lateral infarct 09/29/16: Sinus bradycardia. Rate 54 bpm. Left anterior fascicular block.  Prior septal infarct. 04/24/17: Sinus bradycardia.  Rate 53 bpm.  Prior anterior infarct.  Prior lateral infarct.   07/14/2019: Sinus bradycardia.  Low voltage.  Prior septal infarct.  Prior lateral infarct. 01/11/2020: Sinus  bradycardia.  LAFB.  Low voltage.  Prior septal infarct. 07/05/2020: Atrial fibrillation.  Rate 74 bpm.  Prior anteroseptal infarct. 02/14/2021: Sinus bradycardia. Rate 58 bpm. LAFB. Prior anteroseptal infarct.  30 Day Event Monitor 04/28/18:   Quality: Fair.  Baseline artifact. Predominant rhythm: sinus rhythm Average heart rate: 60 bpm Max heart rate: 99 bpm Min heart rate: 47bpm   Occasional PACs and PVCs Up to 5 beats of NSVT   Echo 05/04/18: Study Conclusions   - Left ventricle: The cavity size was normal. Wall thickness was   increased in a pattern of mild LVH. There was mild focal basal   hypertrophy of the septum. Systolic function was mildly reduced.   The estimated ejection fraction was in the range of 45% to 50%.   There is hypokinesis of the apical myocardium. Doppler parameters   are consistent with abnormal left ventricular relaxation (grade 1   diastolic dysfunction). - Aortic valve: There was trivial regurgitation. 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 - Left atrium: The atrium was mildly dilated. - Pulmonary arteries: Systolic pressure was mildly increased. PA   peak pressure: 33 mm Hg (S).   Impressions:   - Apical hypokinesis with overall mild LV dysfunction; mild   diastolic dysfunction; mild LVH; trace AI; moderately dilated   ascending aorta (4.6 cm; suggest CTA to further assess); mild   LAE; mild TR with mild pulmonary hypertension.   LHC 04/2018: Prox RCA lesion is 75% stenosed. A drug-eluting stent was successfully placed using a STENT ORSIRO 3.0X18. -Postdilated to 3.6 mm Post intervention, there is a 0% residual stenosis. Mid RCA lesion is 20% stenosed. Dist RCA lesion is 20% stenosed with 20% stenosed  side branch in Ost RPDA. There is mild left ventricular systolic dysfunction. The left ventricular ejection fraction is 45-50% by visual estimate. LV end diastolic pressure is moderately elevated. Prox LAD to Mid LAD long stented segment is 10% stenosed.   SUMMARY Two-vessel disease with widely patent stent in the proximal LAD and 75-80% proximal RCA stenosis. Successful DES PCI of proximal RCA using Osiro DES 3.0 mm x 18 mm postdilated to 3.6 mm. Moderately elevated LVEDP. Low normal EF of 45 and 50%.  Difficult to assess regional wall motion normality.   RECOMMENDATIONS Continue aspirin plus Plavix, if the plan will be to convert to DOAC because of A. fib, would simply stop aspirin after 1 month. Uncomplicated PCI, okay for discharge home today as same-day discharge stable. Continue respect modification. Continue beta-blocker  CT Chest 11/16/2018: COMPARISON:  Chest CTA on 10/29/2005   FINDINGS: Cardiovascular: No acute findings. Aortic and coronary artery atherosclerosis.   Mediastinum/Nodes: No soft tissue masses or pathologically enlarged lymph nodes identified. A large hiatal hernia is seen which contains the entire stomach, and shows mild increase in size since previous study. No evidence of gastric volvulus.   Lungs/Pleura: Compressive atelectasis noted in the lower lobes due to the hiatal hernia. No evidence of pulmonary infiltrate or mass. No evidence of pleural effusion.   Upper Abdomen: Calcified gallstone noted, without evidence cholecystitis. Diverticulosis also seen involving visualized portion of colon.   Musculoskeletal:  No suspicious bone lesions.   IMPRESSION: 1. Large hiatal hernia which contains the entire stomach, mildly increased in size since previous study. No evidence of gastric volvulus or other complication. 2. Cholelithiasis. No radiographic evidence of cholecystitis. 3. Colonic diverticulosis.   Aortic Atherosclerosis (ICD10-I70.0). Coronary  artery atherosclerosis.  Lexiscan Myoview 01/19/2019: The left ventricular ejection fraction is mildly  decreased (45-54%). Nuclear stress EF: 50%. There was no ST segment deviation noted during stress. No T wave inversion was noted during stress. Defect 1: There is a large defect of severe severity. Findings consistent with prior myocardial infarction with peri-infarct ischemia. This is an intermediate risk study.   Large size, severe intensity, partially reversible (SDS 4) mid to distal anterior, anteroseptal, apical septal and apical perfusion defect, suggestive of scar with peri-infarct ischemia. LVEF 50% with anteroapical and apical akinesis. Compared to a prior study in 2014, there was a previously noted infarct in this area with minimal peri-infarct ischemia. Intermediate risk study. Clinical correlation advised.  Echo TEE 07/12/2020: 1. Left ventricular ejection fraction, by estimation, is 45 to 50%. The  left ventricle has mildly decreased function. The left ventricle  demonstrates regional wall motion abnormalities. Apical hypokinesis.   2. Right ventricular systolic function is normal. The right ventricular  size is mildly enlarged.   3. The mitral valve is normal in structure. Trivial mitral valve  regurgitation.   4. The aortic valve is tricuspid. Aortic valve regurgitation is not  visualized.   5. Spontaneous echo contrast present, but no left atrial/left atrial  appendage thrombus was detected. Definity was administered with showed  filling of the appendage.   Recent Labs: 08/01/2020: Hemoglobin 15.9; Platelets 215 01/14/2021: BUN 18; Creatinine, Ser 1.12; Magnesium 2.1; Potassium 4.6; Sodium 137   08/11/2017: WBC 5.8, hemoglobin 16.5, hematocrit 48.1, platelets 167 Sodium 139, potassium 4.1, BUN 13, creatinine 0.919 AST 15, ALT 22 Total cholesterol 112, triglycerides 93, HDL 36, LDL 57 TSH 2.37  09/09/2019: WBC 5.0, hemoglobin 13.9, hematocrit 42.6, platelets  142 Hemoglobin A1c 6.4% Total cholesterol 119, triglycerides 64, HDL 42, LDL 64 Sodium 139, potassium 4.4, BUN 18, creatinine 1.03, AST 8, ALT 14  Lipid Panel    Component Value Date/Time   CHOL 124 01/17/2019 0906   TRIG 89 01/17/2019 0906   HDL 37 (L) 01/17/2019 0906   CHOLHDL 3.4 01/17/2019 0906   CHOLHDL 3.7 04/19/2015 0801   VLDL 17 04/19/2015 0801   LDLCALC 70 01/17/2019 0906      Wt Readings from Last 3 Encounters:  02/14/21 205 lb 12.8 oz (93.4 kg)  01/14/21 203 lb 3.2 oz (92.2 kg)  10/05/20 187 lb (84.8 kg)      ASSESSMENT AND PLAN: Ischemic cardiomyopathy LVEF 45 to 50%.  He has mild lower extremity edema.  He is going to take some Lasix today.  His lungs are clear and he does not have any heart failure symptoms.  Continue carvedilol.  He has an ACE-I/ARB allergy.  Consider Jardiance/Farxiga.   Obstructive sleep apnea Continue CPAP.  Dyslipidemia Lipids are not at goal.  Repeat lipids/CMP today.  We will need to increase simvastatin or change statins if LDL is >70.  He will work on diet and exercise.  Recommend at least 150 minutes weekly.  S/P coronary artery stent placement S/p PCI (LAD 2007, RCA 2020).  He has no ischemic symptoms.  Continue carvedilol and simvastatin.  Checking lipids today.  LDL goal <70.   Persistent atrial fibrillation ([place]) He has been going in and out of atrial fibrillation.  In the mornings when he checks his heart is usually out of rhythm.  However his rate is well have been controlled.  He has been asymptomatic and only knows whether he is in or out of atrial fibrillation by checking his cardia mobile device.  For now he will continue Tikosyn.  Continue Eliquis and carvedilol.  He sees EP next month.  He will keep tracking his rhythm more regularly so that he can give him a better assessment of whether he is staying in or out of rhythm.  If he has frequent episodes of atrial fibrillation and remains asymptomatic, would consider stopping  the Tikosyn.  Essential hypertension Blood pressure has been very well have been controlled at home but elevated here today.  He thinks it is coming to the office in a new setting.  We will continue to monitor and continue current regimen for now.  He developed hypotension when we were more aggressive with his blood pressure medications.     Current medicines are reviewed at length with the patient today.  The patient does not have concerns regarding medicines.  The following changes have been made:  no change  Labs/ tests ordered today include:   Orders Placed This Encounter  Procedures   Lipid panel   Comp Met (CMET)   EKG 12-Lead      Disposition:   FU with Benford Asch C. Oval Linsey, MD, [facility] in 6 months.   I,Mathew Stumpf,acting as a Education administrator for [name], MD.,have documented all relevant documentation on the behalf of [name], MD,as directed by  [name], MD while in the presence of [name], MD.  I, [place] Oval Linsey, MD have reviewed all documentation for this visit.  The documentation of the exam, diagnosis, procedures, and orders on 02/14/2021 are all accurate and complete.   Signed, Cruze Zingaro C. Oval Linsey, MD, [facility]  02/14/2021 10:04 AM    [place]

## 2021-02-14 NOTE — Assessment & Plan Note (Signed)
Blood pressure has been very well have been controlled at home but elevated here today.  He thinks it is coming to the office in a new setting.  We will continue to monitor and continue current regimen for now.  He developed hypotension when we were more aggressive with his blood pressure medications.

## 2021-02-14 NOTE — Assessment & Plan Note (Signed)
He has been going in and out of atrial fibrillation.  In the mornings when he checks his heart is usually out of rhythm.  However his rate is well have been controlled.  He has been asymptomatic and only knows whether he is in or out of atrial fibrillation by checking his cardia mobile device.  For now he will continue Tikosyn.  Continue Eliquis and carvedilol.  He sees EP next month.  He will keep tracking his rhythm more regularly so that he can give him a better assessment of whether he is staying in or out of rhythm.  If he has frequent episodes of atrial fibrillation and remains asymptomatic, would consider stopping the Tikosyn.

## 2021-02-14 NOTE — Assessment & Plan Note (Signed)
S/p PCI (LAD 2007, RCA 2020).  He has no ischemic symptoms.  Continue carvedilol and simvastatin.  Checking lipids today.  LDL goal <70.

## 2021-02-18 ENCOUNTER — Other Ambulatory Visit: Payer: Self-pay | Admitting: Cardiovascular Disease

## 2021-02-18 NOTE — Telephone Encounter (Signed)
Rx(s) sent to pharmacy electronically.  

## 2021-03-18 ENCOUNTER — Other Ambulatory Visit: Payer: Self-pay | Admitting: Physician Assistant

## 2021-04-10 ENCOUNTER — Other Ambulatory Visit: Payer: Self-pay

## 2021-04-10 ENCOUNTER — Ambulatory Visit (HOSPITAL_COMMUNITY)
Admission: RE | Admit: 2021-04-10 | Discharge: 2021-04-10 | Disposition: A | Discharge status: Home / Self Care | Payer: Medicare Other | Source: Ambulatory Visit | Attending: Nurse Practitioner | Admitting: Nurse Practitioner

## 2021-04-10 ENCOUNTER — Encounter (HOSPITAL_COMMUNITY): Payer: Self-pay | Admitting: Nurse Practitioner

## 2021-04-10 VITALS — BP 140/70 | HR 82 | Ht 67.0 in | Wt 209.0 lb

## 2021-04-10 DIAGNOSIS — I4819 Other persistent atrial fibrillation: Secondary | ICD-10-CM | POA: Diagnosis present

## 2021-04-10 DIAGNOSIS — I2510 Atherosclerotic heart disease of native coronary artery without angina pectoris: Secondary | ICD-10-CM | POA: Insufficient documentation

## 2021-04-10 DIAGNOSIS — Z7901 Long term (current) use of anticoagulants: Secondary | ICD-10-CM | POA: Diagnosis not present

## 2021-04-10 DIAGNOSIS — I480 Paroxysmal atrial fibrillation: Secondary | ICD-10-CM

## 2021-04-10 DIAGNOSIS — Z79899 Other long term (current) drug therapy: Secondary | ICD-10-CM | POA: Insufficient documentation

## 2021-04-10 DIAGNOSIS — D6869 Other thrombophilia: Secondary | ICD-10-CM

## 2021-04-10 LAB — BASIC METABOLIC PANEL
Anion gap: 8 (ref 5–15)
BUN: 15 mg/dL (ref 8–23)
CO2: 23 mmol/L (ref 22–32)
Calcium: 8.9 mg/dL (ref 8.9–10.3)
Chloride: 105 mmol/L (ref 98–111)
Creatinine, Ser: 1.14 mg/dL (ref 0.61–1.24)
GFR, Estimated: >60 mL/min (ref >60)
Glucose, Bld: 105 mg/dL — ABNORMAL HIGH (ref 70–99)
Potassium: 4.3 mmol/L (ref 3.5–5.1)
Sodium: 136 mmol/L (ref 135–145)

## 2021-04-10 LAB — MAGNESIUM: Magnesium: 2.3 mg/dL (ref 1.7–2.4)
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
## 2021-04-15 IMAGING — CT CT CHEST WITH CONTRAST
1 series · 15 of 34 positions shown, 19 images · IV contrast (APPLIED)
Comparison: Chest CTA on 10/29/2005

CLINICAL DATA: Large hiatal hernia. Postprandial coughing.

EXAM:
CT CHEST WITH CONTRAST
TECHNIQUE: Multidetector CT imaging of the chest was performed during
intravenous contrast administration.
CONTRAST:  75mL FVYFC1-VYY IOPAMIDOL (FVYFC1-VYY) INJECTION 61%

[Series 2: chest w/cm · axial · 0.80mm/px · z∈[-304,-36]mm · 15 of 158 slices shown, 19 images]
[im 12/158  mediastinal]
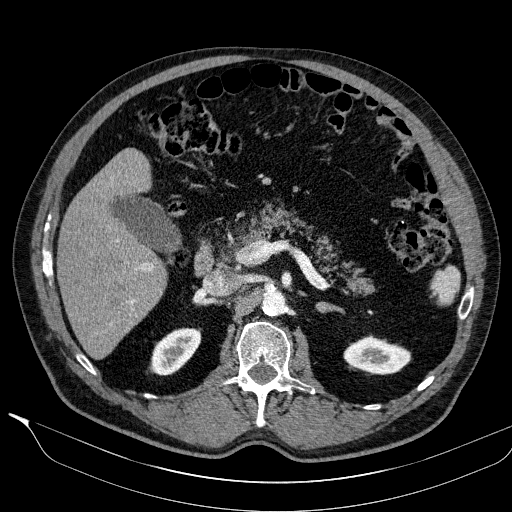
[im 12/158  lung]
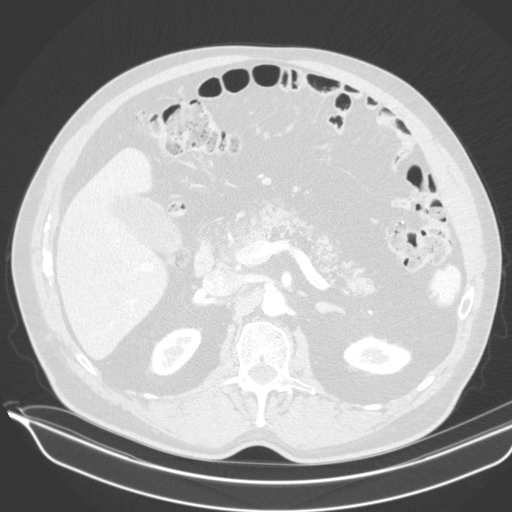
[im 24/158  lung]
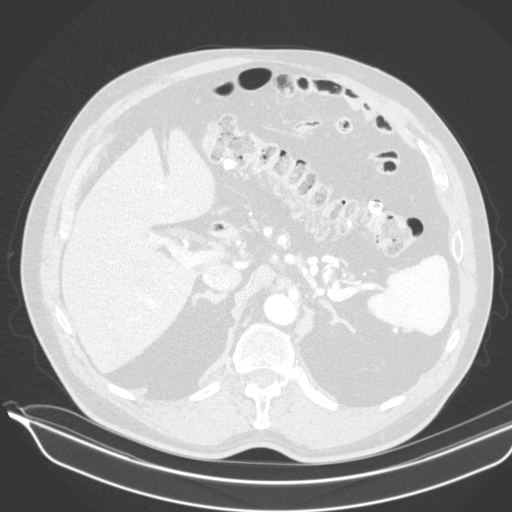
[im 32/158  lung]
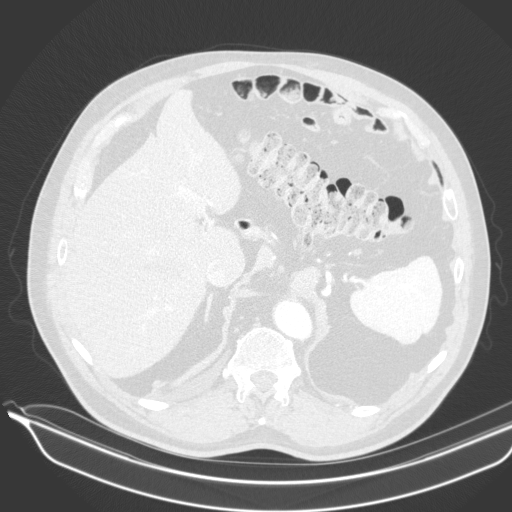
[im 41/158  lung]
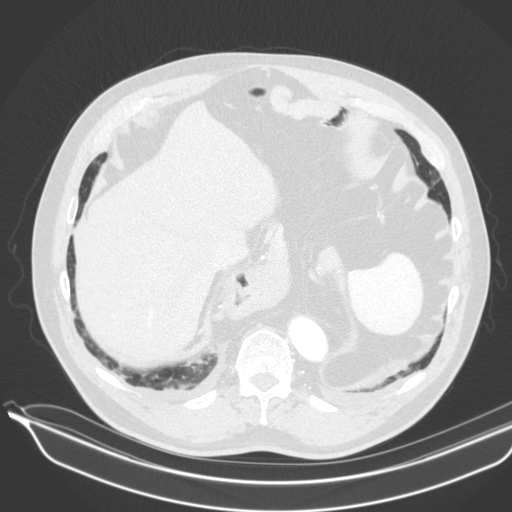
[im 53/158  mediastinal]
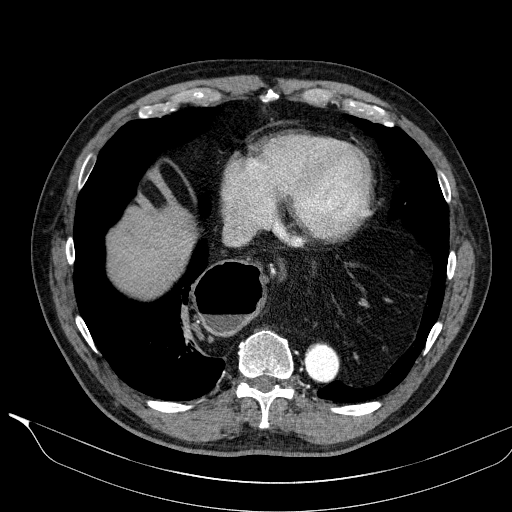
[im 53/158  lung]
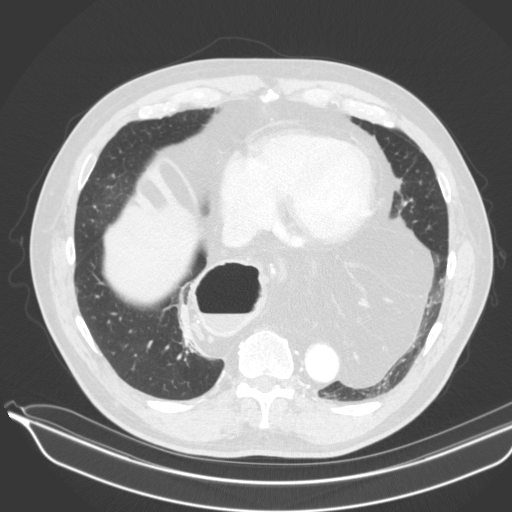
[im 63/158  lung]
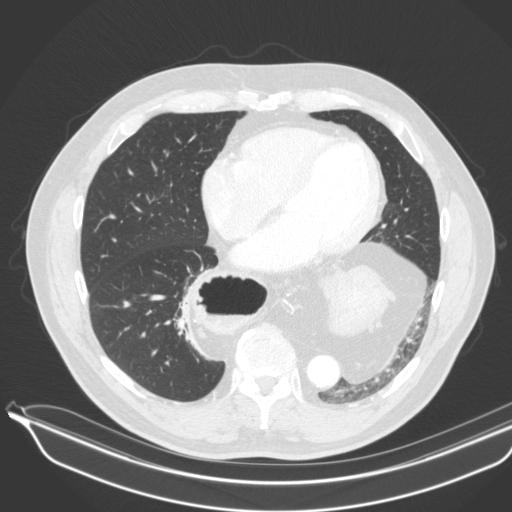
[im 70/158  lung]
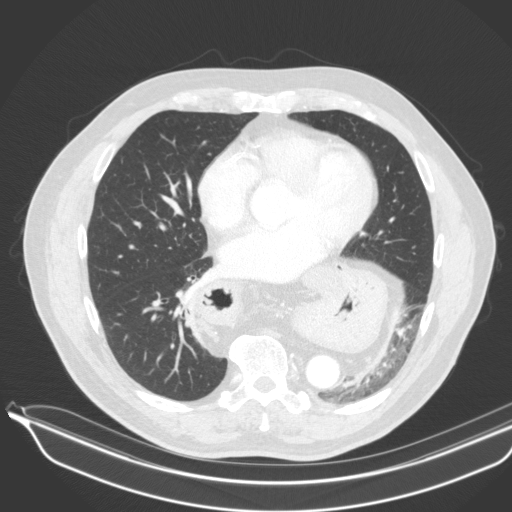
[im 82/158  lung]
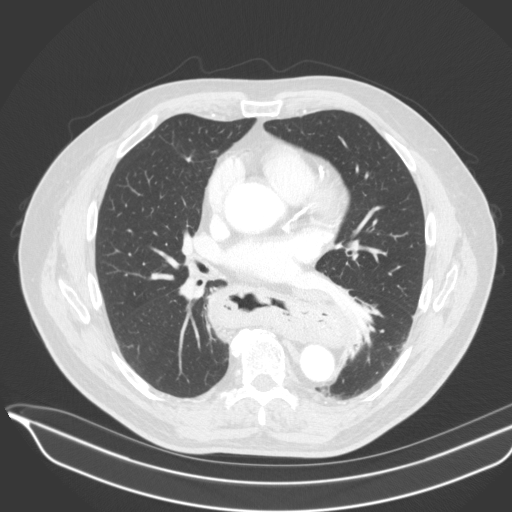
[im 88/158  mediastinal]
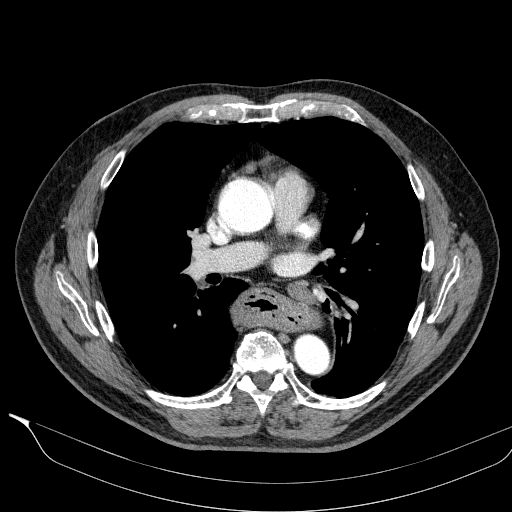
[im 88/158  lung]
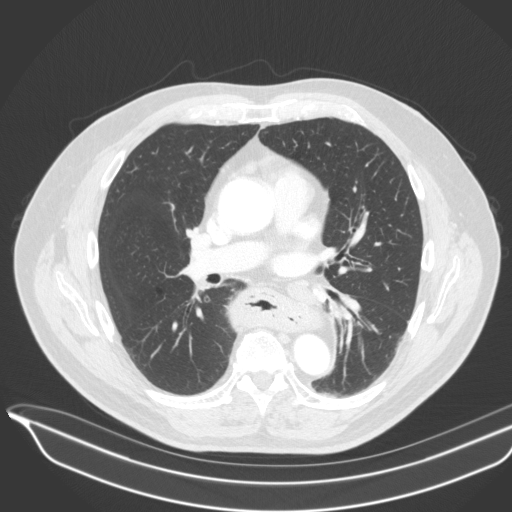
[im 95/158  lung]
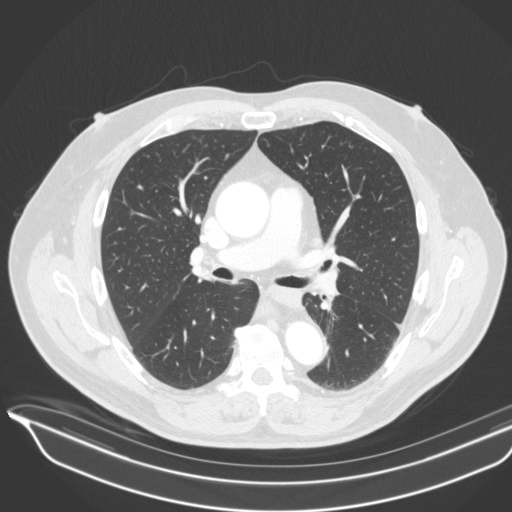
[im 105/158  lung]
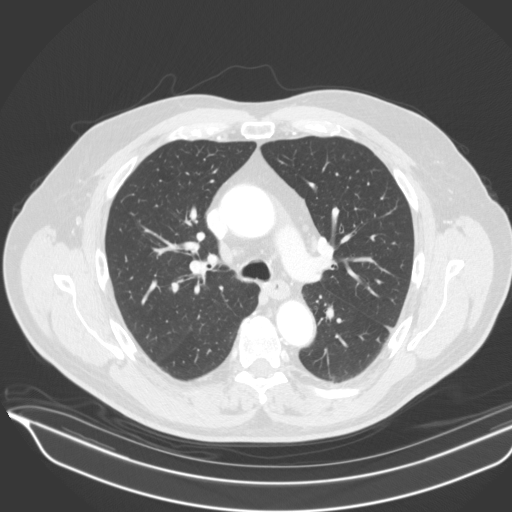
[im 117/158  lung]
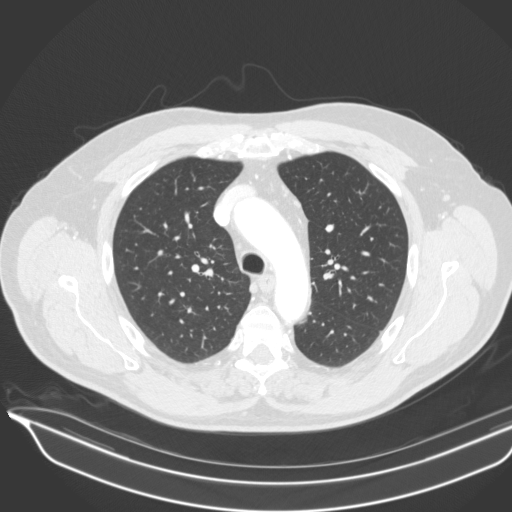
[im 126/158  mediastinal]
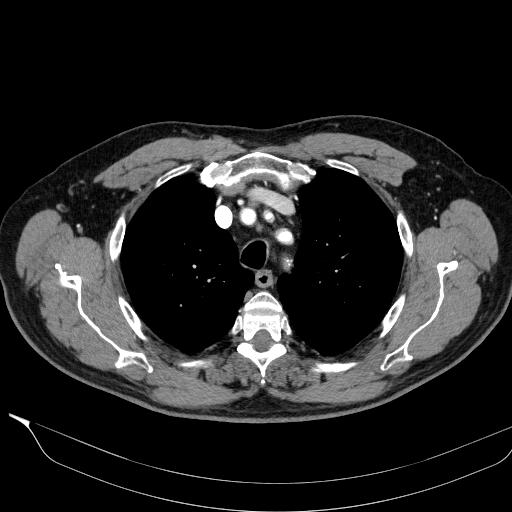
[im 126/158  lung]
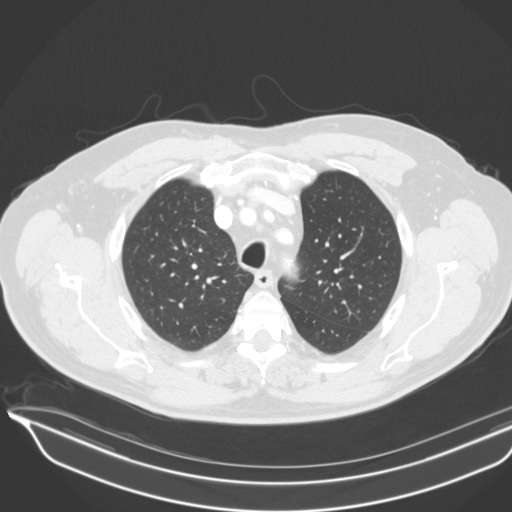
[im 134/158  lung]
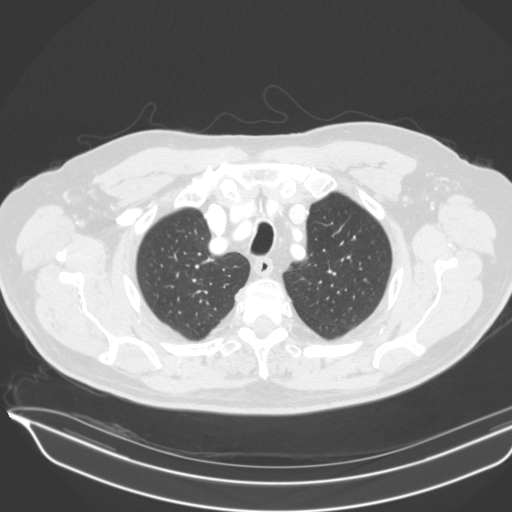
[im 146/158  lung]
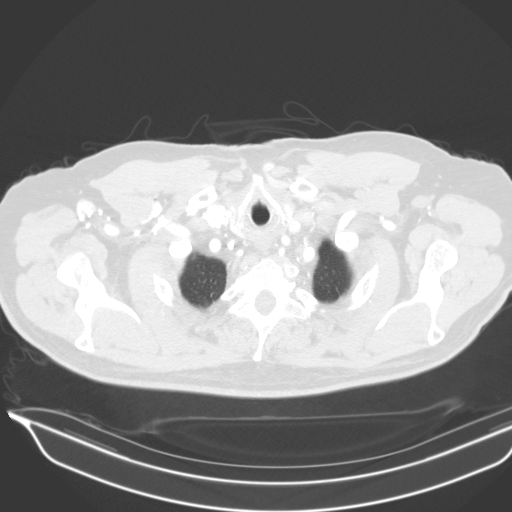

[15 of 34 positions shown; findings below may reference images not displayed]

FINDINGS: Cardiovascular: No acute findings. Aortic and coronary artery
atherosclerosis.

Mediastinum/Nodes: No soft tissue masses or pathologically enlarged
lymph nodes identified. A large hiatal hernia is seen which contains
the entire stomach, and shows mild increase in size since previous
study. No evidence of gastric volvulus.

Lungs/Pleura: Compressive atelectasis noted in the lower lobes due
to the hiatal hernia. No evidence of pulmonary infiltrate or mass.
No evidence of pleural effusion.

Upper Abdomen: Calcified gallstone noted, without evidence
cholecystitis. Diverticulosis also seen involving visualized portion
of colon.

Musculoskeletal:  No suspicious bone lesions.
IMPRESSION: 1. Large hiatal hernia which contains the entire stomach, mildly
increased in size since previous study. No evidence of gastric
volvulus or other complication.
2. Cholelithiasis. No radiographic evidence of cholecystitis.
3. Colonic diverticulosis.

Aortic Atherosclerosis ([id number]). Coronary artery
atherosclerosis.

## 2021-04-22 ENCOUNTER — Other Ambulatory Visit (HOSPITAL_COMMUNITY): Payer: Self-pay | Admitting: Nurse Practitioner

## 2021-05-20 ENCOUNTER — Other Ambulatory Visit (HOSPITAL_COMMUNITY): Payer: Self-pay | Admitting: Nurse Practitioner

## 2021-05-22 ENCOUNTER — Encounter (HOSPITAL_BASED_OUTPATIENT_CLINIC_OR_DEPARTMENT_OTHER): Payer: Self-pay | Admitting: *Deleted

## 2021-05-22 ENCOUNTER — Other Ambulatory Visit: Payer: Self-pay

## 2021-05-22 ENCOUNTER — Emergency Department (HOSPITAL_BASED_OUTPATIENT_CLINIC_OR_DEPARTMENT_OTHER)
Admission: EM | Admit: 2021-05-22 | Discharge: 2021-05-22 | Disposition: A | Discharge status: Home / Self Care | Payer: Medicare Other | Attending: Emergency Medicine | Admitting: Emergency Medicine

## 2021-05-22 DIAGNOSIS — Z7901 Long term (current) use of anticoagulants: Secondary | ICD-10-CM | POA: Diagnosis not present

## 2021-05-22 DIAGNOSIS — R040 Epistaxis: Secondary | ICD-10-CM | POA: Diagnosis present

## 2021-05-22 DIAGNOSIS — I4891 Unspecified atrial fibrillation: Secondary | ICD-10-CM | POA: Insufficient documentation

## 2021-05-22 MED ORDER — CEPHALEXIN 500 MG PO CAPS: 500.0000 mg | ORAL_CAPSULE | Freq: Two times a day (BID) | ORAL | 0 refills | Status: AC

## 2021-05-22 MED ORDER — OXYMETAZOLINE HCL 0.05 % NA SOLN
1.0000 | Freq: Once | NASAL | Status: AC
  Administered 2021-05-22: 1 via NASAL
  Filled 2021-05-22: qty 30

## 2021-05-22 NOTE — Discharge Instructions (Addendum)
It was a pleasure taking care of you today!   You had a rhino rocket placed in your nose today in the ED to aid with the nosebleed. Hold your Eliquis tonight and if you are no longer bleeding, you may start it back on Saturday, 05/25/21.  If you are still bleeding, return to the ED for further evaluation. You were sent a prescription for Keflex, take as prescribed. Attached is information for the on call ENT specialist, Dr. Benjamine Mola call and set up a follow up appointment. If you notice that the nose is bleeding, you may add additional air into the balloon with the syringe. Call your primary care provider today and set up a follow up appointment regarding today's ED visit. Return to the ED if you experience increasing/worsening nosebleeds, lightheadedness, chest pain, shortness of breath.

## 2021-05-22 NOTE — ED Triage Notes (Signed)
Pt arrives POV with wife from PCP where he was seen for nosebleed in right nare which began around 8am, pt is on elaquis for afib.  Pt was at his PCP for about 2 hours but they were not able to stop the bleeding and sent him here.  Pt has gauze in right nare at this time, nose is oozing. Pt reports that blood is running down his throat and he is having to spit it out.

## 2021-05-22 NOTE — ED Provider Notes (Signed)
[facility] EMERGENCY DEPT Provider Note   CSN: [id number] Arrival date & time: 05/22/21  1152     History  Chief Complaint  Patient presents with   Epistaxis    Douglas [name] is a 76 y.o. male with a past medical history of A. fib who presents to the ED complaining of epistaxis onset 8 AM this morning.  He notes he has not had similar symptoms before in the past.  When his nosebleed began this morning he did not apply manual compression to his bilateral naris and instead he was leaning over a sink and allowing the blood to drift out.  He was seen by his primary care office and noted bleeding ongoing for 2 hours.  He notes that they were unable to stop the bleeding after utilizing Afrin and manual compression so they sent the patient to the ED for further evaluation.  Patient does take 5 mg Eliquis twice daily for A. fib.  He denies dizziness, lightheadedness, chest pain, trouble breathing.  The history is provided by the patient and the spouse. No language interpreter was used.      Home Medications Prior to Admission medications   Medication Sig Start Date End Date Taking? Authorizing Provider  cephALEXin (KEFLEX) 500 MG capsule Take 1 capsule (500 mg total) by mouth 2 (two) times daily for 5 days. 05/22/21 05/27/21 Yes Jacque Byron A, PA-C  acetaminophen (TYLENOL) 500 MG tablet Take 500-1,000 mg by mouth every 6 (six) hours as needed (for pain.).    [provider]  amLODipine (NORVASC) 5 MG tablet TAKE 1 TABLET BY MOUTH DAILY 02/18/21   [name], MD  ampicillin (PRINCIPEN) 500 MG capsule Take 500 mg by mouth 2 (two) times daily. 03/12/21   [provider]  apixaban (ELIQUIS) 5 MG TABS tablet Take 1 tablet (5 mg total) by mouth 2 (two) times daily. 07/05/20   [name], MD  carvedilol (COREG) 3.125 MG tablet TAKE 1 TABLET BY MOUTH TWICE DAILY WITH A MEAL 11/12/20   Sherran Needs, NP  Cholecalciferol (VITAMIN D) 2000 UNITS tablet  Take 2,000 Units by mouth daily.    [provider]  colchicine 0.6 MG tablet Take 0.6 mg by mouth 2 (two) times daily as needed (gout). Colcrys    [provider]  dofetilide (TIKOSYN) 500 MCG capsule TAKE 1 CAPSULE BY MOUTH TWICE DAILY 05/20/21   Sherran Needs, NP  EPINEPHrine 0.3 mg/0.3 mL IJ SOAJ injection Inject 0.3 mg into the muscle as needed for allergies. 09/04/20   [provider]  fexofenadine (ALLEGRA) 180 MG tablet Take 180 mg by mouth daily as needed for allergies.    [provider]  furosemide (LASIX) 40 MG tablet TAKE 1 TABLET BY MOUTH DAILY AS NEEDED FOR FLUID OR EDEMA 07/05/20   [name], MD  nitroGLYCERIN (NITROSTAT) 0.4 MG SL tablet Place 0.4 mg under the tongue every 5 (five) minutes as needed for chest pain.    [provider]  omeprazole (PRILOSEC) 20 MG capsule Take 20 mg by mouth daily. 03/06/20   [provider]  ondansetron (ZOFRAN) 4 MG tablet Take 4 mg by mouth as needed. 04/23/20   [provider]  simvastatin (ZOCOR) 20 MG tablet TAKE 1 TABLET BY MOUTH EVERYDAY AT BEDTIME 08/24/17   [name], MD  valACYclovir (VALTREX) 1000 MG tablet Take 1,000 mg by mouth 2 (two) times daily as needed (fever blisters).    [provider]  Allergies    Ace inhibitors, Bee venom, Lisinopril, Losartan, and Other    Review of Systems   Review of Systems  HENT:  Positive for nosebleeds.   Respiratory:  Negative for shortness of breath.   Cardiovascular:  Negative for chest pain.  Neurological:  Negative for dizziness and light-headedness.  All other systems reviewed and are negative.  Physical Exam Updated Vital Signs BP 127/79    Pulse 71    Resp 20    SpO2 97%  Physical Exam Vitals and nursing note reviewed.  Constitutional:      General: He is not in acute distress.    Appearance: He is not diaphoretic.  HENT:     Head: Normocephalic and atraumatic.     Nose:     Right Nostril:  Epistaxis present.     Left Nostril: No epistaxis or septal hematoma.     Comments: Gauze in place to right nare.  Bleeding controlled at this time.  No evidence of epistaxis noted to left nare.  No evidence of septal hematoma noted to left nare.    Mouth/Throat:     Pharynx: No oropharyngeal exudate.     Comments: Airway patent.  No evidence of bleed and airway. Eyes:     General: No scleral icterus.    Conjunctiva/sclera: Conjunctivae normal.  Cardiovascular:     Rate and Rhythm: Normal rate and regular rhythm.     Pulses: Normal pulses.     Heart sounds: Normal heart sounds.  Pulmonary:     Effort: Pulmonary effort is normal. No respiratory distress.     Breath sounds: Normal breath sounds. No wheezing.  Abdominal:     Palpations: Abdomen is soft.  Musculoskeletal:        General: Normal range of motion.     Cervical back: Normal range of motion and neck supple.  Skin:    General: Skin is warm and dry.  Neurological:     Mental Status: He is alert.  Psychiatric:        Behavior: Behavior normal.    ED Results / Procedures / Treatments   Labs (all labs ordered are listed, but only abnormal results are displayed) Labs Reviewed - No data to display  EKG None  Radiology No results found.  Procedures Procedures    Medications Ordered in ED Medications  oxymetazoline (AFRIN) 0.05 % nasal spray 1 spray (1 spray Each Nare Given 05/22/21 1228)    ED Course/ Medical Decision Making/ A&P Clinical Course as of 05/22/21 1702  Wed May 22, 2021  1250 Pt re-evaluated after afrin sprayed in nostril. No active bleeding noted. No bleeding noted to right nare. Will observe for 1 hour. [SB]  1350 Rhino rocket placed with Attending at bedside [SB]  1435 Re-evaluated and no bleeding since placement of rhino rocket in ED. Discussed discharge treatment plan. Pt agreeable at this time. Pt appears safe for discharge. [SB]    Clinical Course User Index [SB] Gus Littler A, PA-C                            Medical Decision Making Risk OTC drugs. Prescription drug management.   Patient presents to the ED with epistaxis since 8 AM this morning.  Patient is on 5 mg Eliquis twice daily for A. fib.  Denies history of similar symptoms.  Patient did try manual compression prior to arrival to the ED.  At his primary care office he  was given Afrin with manual compression with no relief of his epistaxis and sent to the ED for further evaluation. On exam patient with gauze in place to right nare with bleeding controlled at this time.  No evidence of septal hematoma or epistaxis noted to left nare.  Airway patent.  Vital signs stable, patient not tachycardic, not hypoxic.  No evidence of A. fib while patient in the ED on the monitor.  Otherwise no acute cardiovascular or respiratory exam findings.  Differential diagnosis includes coagulopathy, dry nasal mucous membranes, trauma, polyp. Case discussed with attending who agrees with observation at this time due to no active nosebleed.  Discussed with patient will observe and change outer gauze after observation period.  After my initial evaluation, patient noted to have epistaxis again.  At that time nurse administered Afrin.  After evaluation after Afrin use, patient with slight ooze from right nare.  No evidence of septal hematoma.  At this time, discussed with attending on pursuing Rhino Rocket in the ED, assisted attending and placing Rhino Rocket at bedside.  Patient reevaluated after Rhino Rocket placed with no bleeding following.  See attending note for procedure note.  Vital signs stable, patient afebrile, not tachycardic or hypoxic, doubt coagulopathy as cause of symptoms today.  Patient takes Eliquis and is compliant with this medication.  Doubt polyp, no appreciable polyp on nasal exam.  This is likely epistaxis in the setting of nasal manipulation and anticoagulant use. Discussed thoroughly with patient and his wife at bedside regarding  the nature of nosebleeds.  Also discussed in detail importance on picking up Keflex antibiotic due to [employer] in place.  Discussed will give ENT specialist for patient to follow-up with.  Stressed to both patient and wife that they should call today to set up a follow-up appointment regarding today's ED visit.  Patient and wife are agreeable at this time.  As per conversation with attending, discussed with patient to hold Eliquis until 05/25/2021. Patient acknowledges and verbalizes understanding.  Patient appears safe for discharge at this time.  Follow-up as indicated in discharge paperwork.  Final Clinical Impression(s) / ED Diagnoses Final diagnoses:  Epistaxis    Rx / DC Orders ED Discharge Orders          Ordered    cephALEXin (KEFLEX) 500 MG capsule  2 times daily        05/22/21 1437              Jennafer Gladue A, PA-C 05/22/21 1706    Luna Fuse, MD 05/31/21 [phone]

## 2021-05-22 NOTE — ED Notes (Signed)
States still having continuous bleeding from right nose. Patient holding pressure.

## 2021-06-20 ENCOUNTER — Other Ambulatory Visit (HOSPITAL_COMMUNITY): Payer: Self-pay | Admitting: Nurse Practitioner

## 2021-08-08 ENCOUNTER — Ambulatory Visit (HOSPITAL_COMMUNITY)
Admission: RE | Admit: 2021-08-08 | Discharge: 2021-08-08 | Disposition: A | Discharge status: Home / Self Care | Payer: Medicare Other | Source: Ambulatory Visit | Attending: Nurse Practitioner | Admitting: Nurse Practitioner

## 2021-08-08 VITALS — BP 166/90 | HR 55 | Ht 67.0 in | Wt 204.6 lb

## 2021-08-08 DIAGNOSIS — I4819 Other persistent atrial fibrillation: Secondary | ICD-10-CM | POA: Diagnosis present

## 2021-08-08 DIAGNOSIS — Z79899 Other long term (current) drug therapy: Secondary | ICD-10-CM | POA: Insufficient documentation

## 2021-08-08 DIAGNOSIS — Z7901 Long term (current) use of anticoagulants: Secondary | ICD-10-CM | POA: Diagnosis not present

## 2021-08-08 DIAGNOSIS — D6869 Other thrombophilia: Secondary | ICD-10-CM

## 2021-08-08 DIAGNOSIS — I2510 Atherosclerotic heart disease of native coronary artery without angina pectoris: Secondary | ICD-10-CM | POA: Diagnosis not present

## 2021-08-08 DIAGNOSIS — I480 Paroxysmal atrial fibrillation: Secondary | ICD-10-CM | POA: Diagnosis not present

## 2021-08-08 LAB — BASIC METABOLIC PANEL
Anion gap: 7 (ref 5–15)
BUN: 19 mg/dL (ref 8–23)
CO2: 23 mmol/L (ref 22–32)
Calcium: 8.7 mg/dL — ABNORMAL LOW (ref 8.9–10.3)
Chloride: 109 mmol/L (ref 98–111)
Creatinine, Ser: 0.96 mg/dL (ref 0.61–1.24)
GFR, Estimated: >60 mL/min (ref >60)
Glucose, Bld: 153 mg/dL — ABNORMAL HIGH (ref 70–99)
Potassium: 4.4 mmol/L (ref 3.5–5.1)
Sodium: 139 mmol/L (ref 135–145)

## 2021-08-08 LAB — MAGNESIUM: Magnesium: 2.1 mg/dL (ref 1.7–2.4)
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
## 2021-09-04 NOTE — Progress Notes (Signed)
HPI male former smoker followed for OSA, complicated by CAD/stent, HBP, GERD NPSG 01/18/13 AHI  22/hr, moderate OSA, weight  195 lbs  ---------------------------------------------------------------------------.   09/05/20- 76 year old male former smoker followed for OSA, complicated by CAD/stent, PAFib, HBP, GERD/ HH,  CPAP auto 5-15/ [employer] Download -compliance 97%, AHi 1.6/ hr Body weight today -188 lbs Covid vax- Here with wife. CPAP doing well. We discussed alternatives and he is satisfied to continue w CPAP. Now on Tikosyn for Afib- says NSR yesterday, Afib today. Mainly felt as easier fatigue with exertion.  Breathing stable with no issues  09/05/21- 76 year old male former smoker followed for OSA, complicated by CAD/stent, PAFib, HBP, GERD/ large HH,  CPAP auto 5-15/ [employer] Download -compliance 100%, AHI 1/ hr Body weight today 205 lbs Covid vax-3 Phizer Flu vax- Download reviewed.  He is comfortable with his CPAP and satisfied that life is better with it.  No specific concerns.  Says he is breathing well.  Paces himself.  Cardiac status unchanged with no acute events as he continues Tikosyn.  ROS-see HPI   + = positive Constitutional:   No-   weight loss, night sweats, fevers, chills, fatigue, lassitude. HEENT:   No-  headaches, difficulty swallowing, tooth/dental problems, sore throat,       No-  sneezing, itching, ear ache, nasal congestion, post nasal drip,  CV:  No-   chest pain, orthopnea, PND, swelling in lower extremities, anasarca, dizziness, palpitations Resp: No-   shortness of breath with exertion or at rest.              No-   productive cough,  No non-productive cough,  No- coughing up of blood.              No-   change in color of mucus.  No- wheezing.   Skin: No-   rash or lesions. GI:  No-   heartburn, indigestion, abdominal pain, nausea, vomiting,  GU:  MS:  No-   joint pain or swelling.   Neuro-     nothing unusual Psych:  No- change in mood or affect. No  depression or anxiety.  No memory loss.  OBJ- Physical Exam   stable baseline exam General- Alert, Oriented, Affect-appropriate, Distress- none acute. +Overweight Skin- rash-none, lesions- none, excoriation- none Lymphadenopathy- none Head- atraumatic            Eyes- Gross vision intact, PERRLA, conjunctivae and secretions clear            Ears- Hearing, canals-normal            Nose- Clear, no-Septal dev, mucus, polyps, erosion, perforation             Throat- Mallampati II-III , mucosa clear , drainage- none, tonsils- atrophic Neck- flexible , trachea midline, no stridor , thyroid nl, carotid no bruit Chest - symmetrical excursion , unlabored           Heart/CV- RRR  with etxras , no murmur , no gallop  , no rub, nl s1 s2                            - JVD- none , edema- none, stasis changes- none, varices- none           Lung- clear to P&A, wheeze- none, cough- none , dullness-none, rub- none           Chest wall-  Abd-  Br/ Gen/ Rectal- Not done,  not indicated Extrem- cyanosis- none, clubbing, none, atrophy- none, strength- nl Neuro- grossly intact to observation

## 2021-09-05 ENCOUNTER — Encounter: Payer: Self-pay | Admitting: Internal Medicine

## 2021-09-05 ENCOUNTER — Ambulatory Visit: Payer: Medicare Other | Admitting: Internal Medicine

## 2021-09-05 DIAGNOSIS — G4733 Obstructive sleep apnea (adult) (pediatric): Secondary | ICD-10-CM

## 2021-09-05 DIAGNOSIS — I4819 Other persistent atrial fibrillation: Secondary | ICD-10-CM | POA: Diagnosis not present

## 2021-09-05 NOTE — Assessment & Plan Note (Signed)
Benefits from CPAP with good compliance and control Plan- continue auto 5-15 

## 2021-09-05 NOTE — Assessment & Plan Note (Signed)
Rhythm seems very regular at this exam with occasional extra beats and may be NSR.

## 2021-09-05 NOTE — Patient Instructions (Signed)
We can continue CPAP auto 5-15  Glad you are doing well- please call if we can help

## 2021-10-06 ENCOUNTER — Other Ambulatory Visit (HOSPITAL_COMMUNITY): Payer: Self-pay | Admitting: Nurse Practitioner

## 2021-10-31 ENCOUNTER — Ambulatory Visit (HOSPITAL_BASED_OUTPATIENT_CLINIC_OR_DEPARTMENT_OTHER): Payer: Medicare Other | Admitting: Cardiovascular Disease

## 2021-10-31 ENCOUNTER — Encounter (HOSPITAL_BASED_OUTPATIENT_CLINIC_OR_DEPARTMENT_OTHER): Payer: Self-pay

## 2021-10-31 ENCOUNTER — Telehealth (HOSPITAL_BASED_OUTPATIENT_CLINIC_OR_DEPARTMENT_OTHER): Payer: Self-pay | Admitting: *Deleted

## 2021-10-31 ENCOUNTER — Encounter (HOSPITAL_BASED_OUTPATIENT_CLINIC_OR_DEPARTMENT_OTHER): Payer: Self-pay | Admitting: Cardiovascular Disease

## 2021-10-31 VITALS — BP 126/84 | HR 54 | Ht 67.0 in | Wt 200.3 lb

## 2021-10-31 DIAGNOSIS — I5042 Chronic combined systolic (congestive) and diastolic (congestive) heart failure: Secondary | ICD-10-CM

## 2021-10-31 DIAGNOSIS — I10 Essential (primary) hypertension: Secondary | ICD-10-CM | POA: Diagnosis not present

## 2021-10-31 DIAGNOSIS — Z01812 Encounter for preprocedural laboratory examination: Secondary | ICD-10-CM

## 2021-10-31 DIAGNOSIS — I7121 Aneurysm of the ascending aorta, without rupture: Secondary | ICD-10-CM | POA: Diagnosis not present

## 2021-10-31 DIAGNOSIS — G4733 Obstructive sleep apnea (adult) (pediatric): Secondary | ICD-10-CM

## 2021-10-31 DIAGNOSIS — I259 Chronic ischemic heart disease, unspecified: Secondary | ICD-10-CM

## 2021-10-31 HISTORY — DX: Aneurysm of the ascending aorta, without rupture: I71.21

## 2021-10-31 MED ORDER — EMPAGLIFLOZIN 10 MG PO TABS: 10.0000 mg | ORAL_TABLET | Freq: Every day | ORAL | 1 refills | Status: DC

## 2021-10-31 NOTE — Assessment & Plan Note (Signed)
Prior CABG.  He is doing well and has no angina.  He was encouraged to exercise at least 150 minutes.  He is not on aspirin given that he takes Eliquis.  Continue carvedilol and simvastatin.  LDL is at goal.  Lipids are followed at the [facility].

## 2021-10-31 NOTE — Assessment & Plan Note (Signed)
His Kardia mobile device was reviewed.  He does continue to have paroxysms of atrial fibrillation that do not last long.  He is asymptomatic.  On review of his device that his episodes of atrial fibrillation are mostly less than 100 bpm.  Continue Eliquis, carvedilol, and Tikosyn.  He follows in A-fib clinic.

## 2021-10-31 NOTE — Assessment & Plan Note (Signed)
He had an ascending aortic aneurysm on chest CT in 2020.  On my review it was 4.0 cm at that time.  4.6 cm on echo.  We will get a CTA of the chest and abdomen.  Blood pressure is controlled as above.  Continue beta-blocker.

## 2021-10-31 NOTE — Assessment & Plan Note (Signed)
Continue CPAP.  

## 2021-10-31 NOTE — Assessment & Plan Note (Signed)
LVEF around 45%.  Volume status has been stable.  We will add Jardiance.  Continue amlodipine, carvedilol, and Lasix.  We will add Jardiance 10 mg daily.  This will also help with his prediabetes.  He has an ACE inhibitor/ARB allergy.

## 2021-10-31 NOTE — Patient Instructions (Signed)
Medication Instructions:  START JARDIANCE 10 MG DAILY   *If you need a refill on your cardiac medications before your next appointment, please call your pharmacy*  Lab Work: BMET TODAY   If you have labs (blood work) drawn today and your tests are completely normal, you will receive your results only by: [address] (if you have MyChart) OR A paper copy in the mail If you have any lab test that is abnormal or we need to change your treatment, we will call you to review the results.  Testing/Procedures: CT OF CHEST/AORTA SOON   Follow-Up: At [facility], you and your health needs are our priority.  As part of our continuing mission to provide you with exceptional heart care, we have created designated Provider Care Teams.  These Care Teams include your primary Cardiologist (physician) and Advanced Practice Providers (APPs -  Physician Assistants and Nurse Practitioners) who all work together to provide you with the care you need, when you need it.  We recommend signing up for the patient portal called "MyChart".  Sign up information is provided on this After Visit Summary.  MyChart is used to connect with patients for Virtual Visits (Telemedicine).  Patients are able to view lab/test results, encounter notes, upcoming appointments, etc.  Non-urgent messages can be sent to your provider as well.   To learn more about what you can do with MyChart, go to [email or web address].    Your next appointment:   6 month(s)  The format for your next appointment:   In Person  Provider:   [name], MD or Laurann Montana, NP{

## 2021-10-31 NOTE — Progress Notes (Signed)
Cardiology Office Note   Date:  10/31/2021   ID:  Douglas, [name] 11-18-1945, MRN [id number]  PCP:  Douglas Downing, MD  Cardiologist:   Douglas Latch, MD   No chief complaint on file.   History of Present Illness: Douglas [name] is a 76 y.o. male with CAD s/p MI and PCI (LAD 2007, RCA 2020), PAF, chronic systolic and diastolic heart failure, hypertension, hyperlipidemia, chronic occult GI bleed, and OSA on CPAP who presents for follow up.  Douglas [name] was previously a patient of Dr. Mare [name].  He had an anterior MI 10/2005.  He had nuclear stress tests 05/2009 and 10/2012 that were negative for ischemia. At previous appointments carvedilol was reduced due to bradycardia.    Douglas [name] reported dizziness and his heart rate was noted to be low.  He was referred for an event monitor that showed 5 beats of NSVT.  He was referred for an echo 05/04/2018 that revealed LVEF 45 to 50% with hypokinesis of the apical myocardium.  He also had grade 1 diastolic dysfunction.  He then underwent left heart catheterization 04/2018 that revealed a 75% proximal RCA lesion.  A drug-eluting stent was placed.  His prior LAD stent was patent.  His hiatal hernia was repaired 03/2019.  Following that, his blood pressure was running low.  This was thought to be due to his 30 pound weight loss after hiatal hernia surgery.  However he was asymptomatic and did not want to lower his antihypertensives. His amlodipine was subsequently reduced due to hypotension.   He developed atrial fibrillation 06/2020 he was found to be in new onset atrial fibrillation. He was started on Eliquis. He had a TEE cardioversion at which time his LVEF was 45-50% with apical hypokinesis. He followed up in atrial fibrillation clinic 07/2020 due to recurrent atrial fibrillation. He was started on Tikosyn 08/2020. He converted to sinus rhythm and did not require cardioversion.   He saw A. Fib clinic 07/2021 and was doing well.  Today, he is accompanied with his wife and states he has been well. He experienced epistaxis recently and it took a while before it stopped. At home his blood pressure has been fluctuating, but his systolic pressure averages around 130's mmHg. He reports that in the mornings he checks his heart rate, and adds that his A Fib. does not last long. For activity, he tries to exercise whenever he can. 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 His wife states that he has two small "red spots" on his head and believes it could be from the medicine. The patient denies chest pain, shortness of breath, nocturnal dyspnea, orthopnea or peripheral edema. There have been no palpitations, lightheadedness or syncope.   Past Medical History:  Diagnosis Date   Acute on chronic diastolic heart failure ([place]) 01/11/2020   Anemia    Arthritis    "joints pains; comes w/age" (05/09/2014)   Ascending aortic aneurysm ([place]) 10/31/2021   Chronic combined systolic and diastolic heart failure ([place]) 01/11/2020   Chronic combined systolic and diastolic heart failure ([place]) 01/11/2020   Coronary artery disease    Essential hypertension 01/11/2020   GERD (gastroesophageal reflux disease)    H/O hiatal hernia 1980   Heart attack ([place]) 10/29/05   anteroseptal myocardial infaction Taxus stent to LAD   History of blood transfusion 1980?   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/14/2021   Hyperlipidemia    Hypertension    Ischemic heart disease    Lower extremity edema 08/23/2015   OSA on CPAP    Pneumonia    S/P coronary artery stent placement 02/14/2021   Skin cancer    "burnt most of them off; face, arms"    Past Surgical History:  Procedure Laterality Date   CARDIOVASCULAR STRESS TEST Left 05/28/2009   EF 57% no reversible ischemia   CARDIOVERSION N/A 07/12/2020   Procedure: CARDIOVERSION;  Surgeon:  Donato Heinz, MD;  Location: [facility];  Service: Cardiovascular;  Laterality: N/A;   CATARACT EXTRACTION W/ INTRAOCULAR LENS IMPLANT Left [place] Bilateral 2007   "1"   CORONARY STENT INTERVENTION N/A 05/11/2018   Procedure: CORONARY STENT INTERVENTION;  Surgeon: Leonie Man, MD;  Location: [facility];  Service: Cardiovascular;  Laterality: N/A;   DOPPLER ECHOCARDIOGRAPHY  01/06/2007   ESOPHAGEAL MANOMETRY N/A 01/05/2019   Procedure: ESOPHAGEAL MANOMETRY (EM);  Surgeon: Laurence Spates, MD;  Location: [facility];  Service: Endoscopy;  Laterality: N/A;   EYE SURGERY Left 1978   "injury"   HERNIA REPAIR     INGUINAL HERNIA REPAIR Bilateral 05/09/2014   INGUINAL HERNIA REPAIR Bilateral 05/09/2014   Procedure: LAPAROSCOPIC BILATERAL INGUINAL HERNIA REPAIR;  Surgeon: [name], MD;  Location: [facility];  Service: General;  Laterality: Bilateral;   INSERTION OF MESH N/A 05/09/2014   Procedure: INSERTION OF MESH;  Surgeon: [name], MD;  Location: [facility];  Service: General;  Laterality: N/A;  INSERTION OF MESH TO ABDOMEN AND BILATERAL GROIN   LEFT HEART CATH AND CORONARY ANGIOGRAPHY N/A 05/11/2018   Procedure: LEFT HEART CATH AND CORONARY ANGIOGRAPHY;  Surgeon: Leonie Man, MD;  Location: [facility];  Service: Cardiovascular;  Laterality: N/A;   SHOULDER ARTHROSCOPY W/ ROTATOR CUFF REPAIR Right 06/2010   SKIN CANCER EXCISION Left    "arm"   TEE WITHOUT CARDIOVERSION N/A 07/12/2020   Procedure: TRANSESOPHAGEAL ECHOCARDIOGRAM (TEE);  Surgeon: Donato Heinz, MD;  Location: [facility];  Service: Cardiovascular;  Laterality: N/A;   TONSILLECTOMY  4081   UMBILICAL HERNIA REPAIR  4/48/1856   UMBILICAL HERNIA REPAIR N/A 05/09/2014   Procedure: HERNIA REPAIR UMBILICAL ADULT;  Surgeon: [name], MD;  Location: [facility];  Service: General;  Laterality: N/A;     Current Outpatient Medications  Medication Sig Dispense  Refill   acetaminophen (TYLENOL) 500 MG tablet Take 500-1,000 mg by mouth every 6 (six) hours as needed (for pain.).     amLODipine (NORVASC) 5 MG tablet TAKE 1 TABLET BY MOUTH DAILY 90 tablet 2   apixaban (ELIQUIS) 5 MG TABS tablet Take 1 tablet (5 mg total) by mouth 2 (two) times daily. 60 tablet 5   carvedilol (COREG) 3.125 MG tablet TAKE 1 TABLET BY MOUTH TWICE DAILY WITH A MEAL 60 tablet 6   Cholecalciferol (VITAMIN D) 2000 UNITS tablet Take 2,000 Units by mouth daily.     colchicine 0.6 MG tablet Take 0.6 mg by mouth 2 (two) times daily as needed (gout). Colcrys     dofetilide (TIKOSYN) 500 MCG capsule TAKE 1 CAPSULE BY MOUTH TWICE DAILY 60 capsule 4   empagliflozin (JARDIANCE) 10 MG TABS tablet Take 1 tablet (10 mg total) by mouth daily before breakfast. 90 tablet 1   EPINEPHrine 0.3 mg/0.3 mL IJ SOAJ injection Inject 0.3 mg into the muscle  as needed for allergies.     fexofenadine (ALLEGRA) 180 MG tablet Take 180 mg by mouth daily as needed for allergies.     furosemide (LASIX) 40 MG tablet TAKE 1 TABLET BY MOUTH DAILY AS NEEDED FOR FLUID OR EDEMA 90 tablet 3   nitroGLYCERIN (NITROSTAT) 0.4 MG SL tablet Place 0.4 mg under the tongue every 5 (five) minutes as needed for chest pain.     omeprazole (PRILOSEC) 20 MG capsule Take 20 mg by mouth daily.     ondansetron (ZOFRAN) 4 MG tablet Take 4 mg by mouth as needed.     simvastatin (ZOCOR) 20 MG tablet TAKE 1 TABLET BY MOUTH EVERYDAY AT BEDTIME 30 tablet 2   valACYclovir (VALTREX) 1000 MG tablet Take 1,000 mg by mouth 2 (two) times daily as needed (fever blisters).     No current facility-administered medications for this visit.    Allergies:   Ace inhibitors, Bee venom, Lisinopril, Losartan, and Other    Social History:  The patient  reports that he quit smoking about 53 years ago. His smoking use included cigarettes. He has a 5.00 pack-year smoking history. He quit smokeless tobacco use about 43 years ago.  His smokeless tobacco use  included chew. He reports that he does not currently use alcohol. He reports that he does not use drugs.   Family History:  The patient's family history includes Diabetes in his father and mother; Heart disease in his father and mother.    ROS:   Please see the history of present illness. All other systems are reviewed and negative.    PHYSICAL EXAM: VS:  BP 126/84 (BP Location: Left Arm, Patient Position: Sitting, Cuff Size: Normal)   Pulse (!) 54   Ht '5\' 7"'$  (1.702 m)   Wt 200 lb 4.8 oz (90.9 kg)   BMI 31.37 kg/m  , BMI Body mass index is 31.37 kg/m. GENERAL:  Well appearing HEENT: Pupils equal round and reactive, fundi not visualized, oral mucosa unremarkable NECK:  No jugular venous distention, waveform within normal limits, carotid upstroke brisk and symmetric, no bruits LUNGS:  Clear to auscultation bilaterally HEART:  RRR.   PMI not displaced or sustained, S1 and S2 within normal limits, no S3, no S4, no clicks, no rubs, no murmurs ABD:  Flat, positive bowel sounds normal in frequency in pitch, no bruits, no rebound, no guarding, no midline pulsatile mass, no hepatomegaly, no splenomegaly EXT:  2 plus pulses throughout,trace LE pitting edema, no cyanosis no clubbing SKIN:  No rashes no nodules NEURO:  Cranial nerves II through XII grossly intact, motor grossly intact throughout PSYCH:  Cognitively intact, oriented to person place and time  EKG:   12/27/15: Sinus bradycardia.  Rate 49 bpm.  Prior septal infarct.  Prior lateral infarct 09/29/16: Sinus bradycardia. Rate 54 bpm. Left anterior fascicular block. Prior septal infarct. 04/24/17: Sinus bradycardia.  Rate 53 bpm.  Prior anterior infarct.  Prior lateral infarct.   07/14/2019: Sinus bradycardia. Low voltage. Prior septal infarct.  Prior lateral infarct. 01/11/2020: Sinus bradycardia.  LAFB.  Low voltage.  Prior septal infarct. 07/05/2020: Atrial fibrillation. Rate 74 bpm.  Prior anteroseptal infarct. 02/14/2021: Sinus  bradycardia. Rate 58 bpm. LAFB. Prior anteroseptal infarct. 10/31/2021 EKG: Rate 54. Sinus Bradycardia, LAFB, low voltage anteroseptal infarct.   30 Day Event Monitor 04/28/18:   Quality: Fair.  Baseline artifact. Predominant rhythm: sinus rhythm Average heart rate: 60 bpm Max heart rate: 99 bpm Min heart rate: 47bpm   Occasional PACs  and PVCs Up to 5 beats of NSVT   Echo 05/04/18: Study Conclusions   - Left ventricle: The cavity size was normal. Wall thickness was   increased in a pattern of mild LVH. There was mild focal basal   hypertrophy of the septum. Systolic function was mildly reduced.   The estimated ejection fraction was in the range of 45% to 50%.   There is hypokinesis of the apical myocardium. Doppler parameters   are consistent with abnormal left ventricular relaxation (grade 1   diastolic dysfunction). - Aortic valve: There was trivial regurgitation. 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 - Left atrium: The atrium was mildly dilated. - Pulmonary arteries: Systolic pressure was mildly increased. PA   peak pressure: 33 mm Hg (S).   Impressions:   - Apical hypokinesis with overall mild LV dysfunction; mild   diastolic dysfunction; mild LVH; trace AI; moderately dilated   ascending aorta (4.6 cm; suggest CTA to further assess); mild   LAE; mild TR with mild pulmonary hypertension.   LHC 04/2018: Prox RCA lesion is 75% stenosed. A drug-eluting stent was successfully placed using a STENT ORSIRO 3.0X18. -Postdilated to 3.6 mm Post intervention, there is a 0% residual stenosis. Mid RCA lesion is 20% stenosed. Dist RCA lesion is 20% stenosed with 20% stenosed side branch in Ost RPDA. There is mild left ventricular systolic dysfunction. The left ventricular ejection fraction is 45-50% by visual estimate. LV end diastolic pressure is moderately elevated. Prox LAD to Mid LAD long stented segment is 10% stenosed.   SUMMARY Two-vessel disease with  widely patent stent in the proximal LAD and 75-80% proximal RCA stenosis. Successful DES PCI of proximal RCA using Osiro DES 3.0 mm x 18 mm postdilated to 3.6 mm. Moderately elevated LVEDP. Low normal EF of 45 and 50%.  Difficult to assess regional wall motion normality.   RECOMMENDATIONS Continue aspirin plus Plavix, if the plan will be to convert to DOAC because of A. fib, would simply stop aspirin after 1 month. Uncomplicated PCI, okay for discharge home today as same-day discharge stable. Continue respect modification. Continue beta-blocker  CT Chest 11/16/2018: COMPARISON:  Chest CTA on 10/29/2005   FINDINGS: Cardiovascular: No acute findings. Aortic and coronary artery atherosclerosis.   Mediastinum/Nodes: No soft tissue masses or pathologically enlarged lymph nodes identified. A large hiatal hernia is seen which contains the entire stomach, and shows mild increase in size since previous study. No evidence of gastric volvulus.   Lungs/Pleura: Compressive atelectasis noted in the lower lobes due to the hiatal hernia. No evidence of pulmonary infiltrate or mass. No evidence of pleural effusion.   Upper Abdomen: Calcified gallstone noted, without evidence cholecystitis. Diverticulosis also seen involving visualized portion of colon.   Musculoskeletal:  No suspicious bone lesions.   IMPRESSION: 1. Large hiatal hernia which contains the entire stomach, mildly increased in size since previous study. No evidence of gastric volvulus or other complication. 2. Cholelithiasis. No radiographic evidence of cholecystitis. 3. Colonic diverticulosis.   Aortic Atherosclerosis (ICD10-I70.0). Coronary artery atherosclerosis.  Lexiscan Myoview 01/19/2019: The left ventricular ejection fraction is mildly decreased (45-54%). Nuclear stress EF: 50%. There was no ST segment deviation noted during stress. No T wave inversion was noted during stress. Defect 1: There is a large defect of  severe severity. Findings consistent with prior myocardial infarction with peri-infarct ischemia. This is an intermediate risk study.   Large size, severe intensity, partially reversible (SDS 4) mid to distal anterior, anteroseptal, apical septal and  apical perfusion defect, suggestive of scar with peri-infarct ischemia. LVEF 50% with anteroapical and apical akinesis. Compared to a prior study in 2014, there was a previously noted infarct in this area with minimal peri-infarct ischemia. Intermediate risk study. Clinical correlation advised.  Echo TEE 07/12/2020: 1. Left ventricular ejection fraction, by estimation, is 45 to 50%. The  left ventricle has mildly decreased function. The left ventricle  demonstrates regional wall motion abnormalities. Apical hypokinesis.   2. Right ventricular systolic function is normal. The right ventricular  size is mildly enlarged.   3. The mitral valve is normal in structure. Trivial mitral valve  regurgitation.   4. The aortic valve is tricuspid. Aortic valve regurgitation is not  visualized.   5. Spontaneous echo contrast present, but no left atrial/left atrial  appendage thrombus was detected. Definity was administered with showed  filling of the appendage.   Recent Labs: 02/14/2021: ALT 13 08/08/2021: BUN 19; Creatinine, Ser 0.96; Magnesium 2.1; Potassium 4.4; Sodium 139   08/11/2017: WBC 5.8, hemoglobin 16.5, hematocrit 48.1, platelets 167 Sodium 139, potassium 4.1, BUN 13, creatinine 0.919 AST 15, ALT 22 Total cholesterol 112, triglycerides 93, HDL 36, LDL 57 TSH 2.37  09/09/2019: WBC 5.0, hemoglobin 13.9, hematocrit 42.6, platelets 142 Hemoglobin A1c 6.4% Total cholesterol 119, triglycerides 64, HDL 42, LDL 64 Sodium 139, potassium 4.4, BUN 18, creatinine 1.03, AST 8, ALT 14  Lipid Panel    Component Value Date/Time   CHOL 114 02/14/2021 1025   TRIG 96 02/14/2021 1025   HDL 30 (L) 02/14/2021 1025   CHOLHDL 3.8 02/14/2021 1025   CHOLHDL  3.7 04/19/2015 0801   VLDL 17 04/19/2015 0801   LDLCALC 66 02/14/2021 1025      Wt Readings from Last 3 Encounters:  10/31/21 200 lb 4.8 oz (90.9 kg)  09/05/21 205 lb 9.6 oz (93.3 kg)  08/08/21 204 lb 9.6 oz (92.8 kg)      ASSESSMENT AND PLAN: Chronic combined systolic and diastolic heart failure (HCC) LVEF around 45%.  Volume status has been stable.  We will add Jardiance.  Continue amlodipine, carvedilol, and Lasix.  We will add Jardiance 10 mg daily.  This will also help with his prediabetes.  He has an ACE inhibitor/ARB allergy.  Ascending aortic aneurysm [facility]) He had an ascending aortic aneurysm on chest CT in 2020.  On my review it was 4.0 cm at that time.  4.6 cm on echo.  We will get a CTA of the chest and abdomen.  Blood pressure is controlled as above.  Continue beta-blocker.  PAF (paroxysmal atrial fibrillation) ([place]) His Kardia mobile device was reviewed.  He does continue to have paroxysms of atrial fibrillation that do not last long.  He is asymptomatic.  On review of his device that his episodes of atrial fibrillation are mostly less than 100 bpm.  Continue Eliquis, carvedilol, and Tikosyn.  He follows in A-fib clinic.  Obstructive sleep apnea Continue CPAP.  Ischemic heart disease Prior CABG.  He is doing well and has no angina.  He was encouraged to exercise at least 150 minutes.  He is not on aspirin given that he takes Eliquis.  Continue carvedilol and simvastatin.  LDL is at goal.  Lipids are followed at the [facility].     Current medicines are reviewed at length with the patient today.  The patient does not have concerns regarding medicines.  The following changes have been made:  add Jardiance  Labs/ tests ordered today include:  Orders Placed This Encounter  Procedures   CT ANGIO CHEST AORTA W/CM & OR WO/CM   Basic metabolic panel   EKG [id number]      Disposition:   FU with Tarell Schollmeyer C. Oval Linsey, MD, [facility] in 6 months.    I,Tinashe  Williams,acting as a Education administrator for [place], MD.,have documented all relevant documentation on the behalf of Douglas Latch, MD,as directed by  Douglas Latch, MD while in the presence of Douglas Latch, MD.   I, Kinder Oval Linsey, MD have reviewed all documentation for this visit.  The documentation of the exam, diagnosis, procedures, and orders on 10/31/2021 are all accurate and complete.

## 2021-10-31 NOTE — Telephone Encounter (Signed)
JARDIANCE 10 MG #2 LOT #[id number] EXP 1/25 GIVEN AT OFFICE VISIT TODAY

## 2021-11-01 LAB — BASIC METABOLIC PANEL
BUN/Creatinine Ratio: 23 (ref 10–24)
BUN: 23 mg/dL (ref 8–27)
CO2: 21 mmol/L (ref 20–29)
Calcium: 9.1 mg/dL (ref 8.6–10.2)
Chloride: 106 mmol/L (ref 96–106)
Creatinine, Ser: 1.02 mg/dL (ref 0.76–1.27)
Glucose: 137 mg/dL — ABNORMAL HIGH (ref 70–99)
Potassium: 4.9 mmol/L (ref 3.5–5.2)
Sodium: 140 mmol/L (ref 134–144)
eGFR: 76 mL/min/{1.73_m2} (ref >59)

## 2021-11-08 ENCOUNTER — Ambulatory Visit (HOSPITAL_COMMUNITY)
Admission: RE | Admit: 2021-11-08 | Discharge: 2021-11-08 | Disposition: A | Discharge status: Home / Self Care | Payer: Medicare Other | Source: Ambulatory Visit | Attending: Cardiovascular Disease | Admitting: Cardiovascular Disease

## 2021-11-08 ENCOUNTER — Telehealth (HOSPITAL_BASED_OUTPATIENT_CLINIC_OR_DEPARTMENT_OTHER): Payer: Self-pay

## 2021-11-08 DIAGNOSIS — I7121 Aneurysm of the ascending aorta, without rupture: Secondary | ICD-10-CM | POA: Insufficient documentation

## 2021-11-08 DIAGNOSIS — I77810 Thoracic aortic ectasia: Secondary | ICD-10-CM

## 2021-11-08 DIAGNOSIS — I10 Essential (primary) hypertension: Secondary | ICD-10-CM | POA: Insufficient documentation

## 2021-11-08 MED ORDER — IOHEXOL 350 MG/ML SOLN
100.0000 mL | Freq: Once | INTRAVENOUS | Status: AC | PRN
  Administered 2021-11-08: 100 mL via INTRAVENOUS

## 2021-11-08 NOTE — Telephone Encounter (Addendum)
Results called to patient who verbalizes understanding! Repeat testing ordered.     ----- Message from [name], MD sent at 11/08/2021  2:40 PM EDT ----- Hiatal hernia improved from prior.  His aorta is 4.5 cm.  This is moderately dilated.  Slightly increased from prior.  Let's get an echo in 6 months to keep an eye on it.

## 2021-11-14 ENCOUNTER — Telehealth (HOSPITAL_BASED_OUTPATIENT_CLINIC_OR_DEPARTMENT_OTHER): Payer: Self-pay | Admitting: Cardiovascular Disease

## 2021-11-14 MED ORDER — AMLODIPINE BESYLATE 5 MG PO TABS: 5.0000 mg | ORAL_TABLET | Freq: Every day | ORAL | 2 refills | Status: DC

## 2021-11-14 NOTE — Telephone Encounter (Signed)
Rx request sent to pharmacy.  

## 2021-11-14 NOTE — Telephone Encounter (Signed)
*  STAT* If patient is at the pharmacy, call can be transferred to refill team.   1. Which medications need to be refilled? (please list name of each medication and dose if known)   amLODipine (NORVASC) 5 MG tablet  2. Which pharmacy/location (including street and city if local pharmacy) is medication to be sent to?  [place], [place]  3. Do they need a 30 day or 90 day supply?   90 days   Wife called stating that patient is out of this medication.  Patient is going out of town tomorrow.

## 2022-01-06 ENCOUNTER — Other Ambulatory Visit (HOSPITAL_COMMUNITY): Payer: Self-pay | Admitting: Nurse Practitioner

## 2022-02-04 ENCOUNTER — Encounter (HOSPITAL_COMMUNITY): Payer: Self-pay | Admitting: Nurse Practitioner

## 2022-02-04 ENCOUNTER — Ambulatory Visit (HOSPITAL_COMMUNITY)
Admission: RE | Admit: 2022-02-04 | Discharge: 2022-02-04 | Disposition: A | Discharge status: Home / Self Care | Payer: Medicare Other | Source: Ambulatory Visit | Attending: Nurse Practitioner | Admitting: Nurse Practitioner

## 2022-02-04 VITALS — BP 136/84 | HR 94 | Ht 67.0 in | Wt 204.8 lb

## 2022-02-04 DIAGNOSIS — I4819 Other persistent atrial fibrillation: Secondary | ICD-10-CM | POA: Insufficient documentation

## 2022-02-04 DIAGNOSIS — Z7902 Long term (current) use of antithrombotics/antiplatelets: Secondary | ICD-10-CM | POA: Diagnosis not present

## 2022-02-04 DIAGNOSIS — G4733 Obstructive sleep apnea (adult) (pediatric): Secondary | ICD-10-CM | POA: Diagnosis not present

## 2022-02-04 DIAGNOSIS — Z79899 Other long term (current) drug therapy: Secondary | ICD-10-CM | POA: Diagnosis not present

## 2022-02-04 DIAGNOSIS — D6869 Other thrombophilia: Secondary | ICD-10-CM | POA: Diagnosis not present

## 2022-02-04 DIAGNOSIS — Z7901 Long term (current) use of anticoagulants: Secondary | ICD-10-CM | POA: Diagnosis not present

## 2022-02-04 DIAGNOSIS — I2510 Atherosclerotic heart disease of native coronary artery without angina pectoris: Secondary | ICD-10-CM | POA: Diagnosis not present

## 2022-02-04 DIAGNOSIS — I4891 Unspecified atrial fibrillation: Secondary | ICD-10-CM | POA: Diagnosis not present

## 2022-02-04 DIAGNOSIS — I480 Paroxysmal atrial fibrillation: Secondary | ICD-10-CM

## 2022-02-04 DIAGNOSIS — I4892 Unspecified atrial flutter: Secondary | ICD-10-CM | POA: Diagnosis not present

## 2022-02-04 DIAGNOSIS — Z7982 Long term (current) use of aspirin: Secondary | ICD-10-CM | POA: Insufficient documentation

## 2022-02-04 LAB — BASIC METABOLIC PANEL
Anion gap: 6 (ref 5–15)
BUN: 19 mg/dL (ref 8–23)
CO2: 28 mmol/L (ref 22–32)
Calcium: 9.1 mg/dL (ref 8.9–10.3)
Chloride: 104 mmol/L (ref 98–111)
Creatinine, Ser: 1.07 mg/dL (ref 0.61–1.24)
GFR, Estimated: >60 mL/min (ref >60)
Glucose, Bld: 113 mg/dL — ABNORMAL HIGH (ref 70–99)
Potassium: 4.4 mmol/L (ref 3.5–5.1)
Sodium: 138 mmol/L (ref 135–145)

## 2022-02-04 LAB — MAGNESIUM: Magnesium: 2.2 mg/dL (ref 1.7–2.4)

## 2022-02-04 MED ORDER — DOFETILIDE 500 MCG PO CAPS: 500.0000 ug | ORAL_CAPSULE | Freq: Two times a day (BID) | ORAL | 1 refills | Status: DC

## 2022-02-04 NOTE — Progress Notes (Signed)
Primary Care Physician: [name], MD Referring Physician: ER f/u  Cardiologist: Dr. Oval Linsey Prior EP: Dr. . Carolin Sicks Douglas [name] is a 76 y.o. male with a h/o CAD and presented to the ER 04/18/18, with 2 hours of palpitations and found to be in new onset afib, rate controlled. He self converted in the ER. Since he was already on asa/plavix for CAD, he was not started on anticoagulation. He was referred to the afib clinic for further evaluation. He was already on BB. He did not drink alcohol, no caffeine, does have OSA and uses CPAP religiously. Walks for exercise.  He wore a heart monitor that showed no afib.  He is presumed to have had Covid in February after wife had mild symptoms and tested positive  His PCR was negative, but tested  early in the course of his symptoms. At that  time he developed afib. He saw Dr. Oval Linsey and was started on anticoagulation for 3 weeks and cardioverted. He had a successful cardioversion but had ERAF. He was referred back to the afib clinic.   We discussed today means of restoring SR. His two options are amiodarone or Tikosyn. Flecainide contraindicated due to CAD.  After full discussion on risk vrs benefit of both drugs, he has decided to schedule a Tikosyn admit. Qt acceptable, not does appear to have any contraindicated drugs. Rare benadryl use. So far, he is tolerating Afib. He worked in the yard yesterday for many hours and felt ok with this activity. He is rate controlled in the office.   F/u in afib clinic, 08/29/18, for Tikosyn admit. No missed anticoagulation. No benadryl use. Marland Kitchen He is currently tolerating rate controlled afib ok, not so much when he first developed afib. QT interval  is good at 417 ms.  He will use his insurance to get his med initially, but then plans to get it through the [place].  F/u in afib clinic, 5/19,  for recent loading of tikosyn.  He converted on drug and did not require CV. He is in SR today but has had  frequent breakthrough episodes of afib since start of tikosyn. He has a Investment banker, operational to confirm. He usually is not aware of the start of Afib but knows when he goes back in rhythm as he  feels lightheaded for a few seconds.   F/u in afib clinic, 01/14/21. He remains on tikosyn. He is in sinus brady at 55 bpm. He has been staying in rhythm, no afib appreciated. Qt stable. [place] the [place] and his weight is up 12 lbs. He takes lasix prn. He did take it yesterday. He has mild ankle swelling. No dyspnea. Compliant with Tikosyn and eliquis for a CHA2DS2VASc score of 4.   F/u in afib clinic, 04/10/21. He  is out of rhythm this am. Pt was in rhythm earlier this am per [place], reviewed. He will occasionally with show afib for a very short period of time, not symptomatic, rates controlled.complaint with Tikosyn.   F/u in afib clinic, 08/08/21 for Tikosyn surveillance. He has been staying in [place].  He reports a nose bleed late winter that required packing and cautery but has not reoccurred. Compliant with Tikosyn.   F/u in afib clinic, 02/04/22 for Tikosyn surveillance. Ekg shows atrial flutter at 94 bpm. Qtc 472 ms. He reports that he having breakthroughafib/flutter mostly for a period of time evey day. He is compliant with tiksoyn. He saw Dr.  Allred last in 2022 and ablation was discussed but at that point tikosyn was working well for the pt so he deferred. He is interested to see EP and discuss again.   Today, he denies symptoms of palpitations, chest pain, shortness of breath, orthopnea, PND, lower extremity edema, dizziness, presyncope, syncope, or neurologic sequela. The patient is tolerating medications without difficulties and is otherwise without complaint today.   Past Medical History:  Diagnosis Date   Acute on chronic diastolic heart failure ([place]) 01/11/2020   Anemia    Arthritis    "joints pains; comes w/age" (05/09/2014)   Ascending aortic aneurysm ([place]) 10/31/2021   Chronic  combined systolic and diastolic heart failure ([place]) 01/11/2020   Chronic combined systolic and diastolic heart failure ([place]) 01/11/2020   Coronary artery disease    Essential hypertension 01/11/2020   GERD (gastroesophageal reflux disease)    H/O hiatal hernia 1980   Heart attack ([place]) 10/29/05   anteroseptal myocardial infaction Taxus stent to LAD   History of blood transfusion 1980?   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   Hyperlipidemia    Hypertension    Ischemic heart disease    Lower extremity edema 08/23/2015   OSA on CPAP    Pneumonia    S/P coronary artery stent placement 02/14/2021   Skin cancer    "burnt most of them off; face, arms"   Past Surgical History:  Procedure Laterality Date   CARDIOVASCULAR STRESS TEST Left 05/28/2009   EF 57% no reversible ischemia   CARDIOVERSION N/A 07/12/2020   Procedure: CARDIOVERSION;  Surgeon: Donato Heinz, MD;  Location: [facility];  Service: Cardiovascular;  Laterality: N/A;   CATARACT EXTRACTION W/ INTRAOCULAR LENS IMPLANT Left [place] Bilateral 2007   "1"   CORONARY STENT INTERVENTION N/A 05/11/2018   Procedure: CORONARY STENT INTERVENTION;  Surgeon: Leonie Man, MD;  Location: [facility];  Service: Cardiovascular;  Laterality: N/A;   DOPPLER ECHOCARDIOGRAPHY  01/06/2007   ESOPHAGEAL MANOMETRY N/A 01/05/2019   Procedure: ESOPHAGEAL MANOMETRY (EM);  Surgeon: Laurence Spates, MD;  Location: [facility];  Service: Endoscopy;  Laterality: N/A;   EYE SURGERY Left 1978   "injury"   HERNIA REPAIR     INGUINAL HERNIA REPAIR Bilateral 05/09/2014   INGUINAL HERNIA REPAIR Bilateral 05/09/2014   Procedure: LAPAROSCOPIC BILATERAL INGUINAL HERNIA REPAIR;  Surgeon: [name], MD;  Location: [facility] [place];  Service: General;  Laterality: Bilateral;   INSERTION OF MESH N/A 05/09/2014   Procedure:  INSERTION OF MESH;  Surgeon: [name], MD;  Location: [facility];  Service: General;  Laterality: N/A;  INSERTION OF MESH TO ABDOMEN AND BILATERAL GROIN   LEFT HEART CATH AND CORONARY ANGIOGRAPHY N/A 05/11/2018   Procedure: LEFT HEART CATH AND CORONARY ANGIOGRAPHY;  Surgeon: Leonie Man, MD;  Location: [facility];  Service: Cardiovascular;  Laterality: N/A;   SHOULDER ARTHROSCOPY W/ ROTATOR CUFF REPAIR Right 06/2010   SKIN CANCER EXCISION Left    "arm"   TEE WITHOUT CARDIOVERSION N/A 07/12/2020   Procedure: TRANSESOPHAGEAL ECHOCARDIOGRAM (TEE);  Surgeon: Donato Heinz, MD;  Location: [facility];  Service: Cardiovascular;  Laterality: N/A;   TONSILLECTOMY  4132   UMBILICAL HERNIA REPAIR  4/40/1027   UMBILICAL HERNIA REPAIR N/A 05/09/2014   Procedure: HERNIA REPAIR UMBILICAL ADULT;  Surgeon: [name], MD;  Location: [facility];  Service: General;  Laterality: N/A;    Current Outpatient Medications  Medication Sig Dispense Refill   acetaminophen (TYLENOL) 500 MG tablet Take 500-1,000 mg by mouth every 6 (six) hours as needed (for pain.).     amLODipine (NORVASC) 5 MG tablet Take 1 tablet (5 mg total) by mouth daily. 90 tablet 2   apixaban (ELIQUIS) 5 MG TABS tablet Take 1 tablet (5 mg total) by mouth 2 (two) times daily. 60 tablet 5   carvedilol (COREG) 3.125 MG tablet TAKE 1 TABLET BY MOUTH TWICE DAILY WITH A MEAL 60 tablet 6   Cholecalciferol (VITAMIN D) 2000 UNITS tablet Take 2,000 Units by mouth daily.     colchicine 0.6 MG tablet Take 0.6 mg by mouth 2 (two) times daily as needed (gout). Colcrys     dofetilide (TIKOSYN) 500 MCG capsule TAKE 1 CAPSULE BY MOUTH TWICE DAILY 60 capsule 4   empagliflozin (JARDIANCE) 10 MG TABS tablet Take 1 tablet (10 mg total) by mouth daily before breakfast. 90 tablet 1   EPINEPHrine 0.3 mg/0.3 mL IJ SOAJ injection Inject 0.3 mg into the muscle as needed for allergies.     fexofenadine (ALLEGRA) 180 MG tablet Take 180 mg by mouth daily as  needed for allergies.     furosemide (LASIX) 40 MG tablet TAKE 1 TABLET BY MOUTH DAILY AS NEEDED FOR FLUID OR EDEMA 90 tablet 3   nitroGLYCERIN (NITROSTAT) 0.4 MG SL tablet Place 0.4 mg under the tongue every 5 (five) minutes as needed for chest pain.     omeprazole (PRILOSEC) 20 MG capsule Take 20 mg by mouth daily.     ondansetron (ZOFRAN) 4 MG tablet Take 4 mg by mouth as needed.     simvastatin (ZOCOR) 20 MG tablet TAKE 1 TABLET BY MOUTH EVERYDAY AT BEDTIME 30 tablet 2   valACYclovir (VALTREX) 1000 MG tablet Take 1,000 mg by mouth 2 (two) times daily as needed (fever blisters).     No current facility-administered medications for this encounter.    Allergies  Allergen Reactions   Ace Inhibitors Cough   Bee Venom     Other reaction(s): swelling   Lisinopril Cough   Losartan Cough   Other Swelling    Social History   Socioeconomic History   Marital status: Married    Spouse name: Deadrick Stidd   Number of children: 1   Years of education: Not on file   Highest education level: Not on file  Occupational History   Not on file  Tobacco Use   Smoking status: Former    Packs/day: 0.50    Years: 10.00    Total pack years: 5.00    Types: Cigarettes    Quit date: 04/21/1968    Years since quitting: 53.8   Smokeless tobacco: Former    Types: Chew    Quit date: 04/21/1978  Vaping Use   Vaping Use: Never used  Substance and Sexual Activity   Alcohol use: Not Currently    Comment: 05/09/2014 "stopped drinking in ~ 2007; never drank much"   Drug use: No   Sexual activity: Yes  Other Topics Concern   Not on file  Social History Narrative   Not on file   Social Determinants of Health   Financial Resource Strain: Not on file  Food Insecurity: Not on file  Transportation Needs: Not on file  Physical Activity: Not on file  Stress: Not on file  Social Connections: Not on file  Intimate Partner  Violence: Not on file    Family History  Problem Relation Age of Onset   Heart  disease Mother    Diabetes Mother    Heart disease Father    Diabetes Father     ROS- All systems are reviewed and negative except as per the HPI above  Physical Exam: Vitals:   02/04/22 0834  Weight: 92.9 kg  Height: '5\' 7"'$  (1.702 m)    Wt Readings from Last 3 Encounters:  02/04/22 92.9 kg  10/31/21 90.9 kg  09/05/21 93.3 kg    Labs: Lab Results  Component Value Date   NA 140 10/31/2021   K 4.9 10/31/2021   CL 106 10/31/2021   CO2 21 10/31/2021   GLUCOSE 137 (H) 10/31/2021   BUN 23 10/31/2021   CREATININE 1.02 10/31/2021   CALCIUM 9.1 10/31/2021   MG 2.1 08/08/2021   No results found for: "INR" Lab Results  Component Value Date   CHOL 114 02/14/2021   HDL 30 (L) 02/14/2021   LDLCALC 66 02/14/2021   TRIG 96 02/14/2021     GEN- The patient is well appearing, alert and oriented x 3 today.   Head- normocephalic, atraumatic Eyes-  Sclera clear, conjunctiva pink Ears- hearing intact Oropharynx- clear Neck- supple, no JVP Lymph- no cervical lymphadenopathy Lungs- Clear to ausculation bilaterally, normal work of breathing Heart regular rate and rhythm, no murmurs, rubs or gallops, PMI not laterally displaced GI- soft, NT, ND, + BS Extremities- no clubbing, cyanosis, or edema MS- no significant deformity or atrophy Skin- no rash or lesion Psych- euthymic mood, full affect Neuro- strength and sensation are intact  EKG- Sinus brady at 55 bpm, pr int 204 ms,s qrs int 84 ms, qtc 440 ms  Epic records reviewed ER records reviewed  TEE echo- 07/12/20- 1. Left ventricular ejection fraction, by estimation, is 45 to 50%. The  left ventricle has mildly decreased function. The left ventricle  demonstrates regional wall motion abnormalities. Apical hypokinesis.   2. Right ventricular systolic function is normal. The right ventricular  size is mildly enlarged.   3. The mitral valve is normal in structure. Trivial mitral valve  regurgitation.   4. The aortic valve is  tricuspid. Aortic valve regurgitation is not  visualized.   5. Spontaneous echo contrast present, but no left atrial/left atrial  appendage thrombus was detected. Definity was administered with showed  filling of the appendage.    Assessment and Plan: 1.Presisitent  afib Successful cardioversion 07/12/20 but ERAF 2 weeks later  Tikosyn admit in May 2022 Having paroxysmal afib with high afib burden  Increase  carvedilol  to 3.125 mg 2 tabs bid  He will call back in one week to see if helps with reducing afib burden If so will call in new rx for 6.25 mg bid  Continue dofetilide 500 mcg bid   qtc in acceptable range in afib Bmet/mag today   2. CHA2DS2VASc score of at least 5 Contiue  eliquis 5 mg bid     Will refer to EP to discuss ablation     Butch Penny C. Kimanh Templeman, [facility] [address] [place], [place] [address] [phone]

## 2022-02-17 ENCOUNTER — Encounter: Payer: Self-pay | Admitting: Cardiology

## 2022-02-17 ENCOUNTER — Ambulatory Visit: Payer: Medicare Other | Attending: Cardiology | Admitting: Cardiology

## 2022-02-17 ENCOUNTER — Telehealth: Payer: Self-pay | Admitting: Cardiology

## 2022-02-17 VITALS — BP 134/80 | HR 73 | Ht 67.0 in | Wt 204.2 lb

## 2022-02-17 DIAGNOSIS — D6869 Other thrombophilia: Secondary | ICD-10-CM | POA: Diagnosis not present

## 2022-02-17 DIAGNOSIS — I4819 Other persistent atrial fibrillation: Secondary | ICD-10-CM | POA: Diagnosis not present

## 2022-02-17 DIAGNOSIS — I480 Paroxysmal atrial fibrillation: Secondary | ICD-10-CM

## 2022-02-17 NOTE — Telephone Encounter (Signed)
[place]we Message:       Patient had an appointment with Dr Curt Bears earlier today. The wife would like for Rosann Auerbach to please her a call please.
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
## 2022-02-17 NOTE — Telephone Encounter (Signed)
I called and spoke with pt's Wife. She wanted Dr. Curt Bears to be aware that Douglas [name] increased his Coreg to 3.'125mg'$  two tablets twice daily. (Dr. Curt Bears is aware and ok with it)  I went over Ablation and CT instructions. Pt does use his MyChart so I have sent letters via [place].

## 2022-02-17 NOTE — Addendum Note (Signed)
Addended by: Carylon Perches on: 02/17/2022 03:58 PM   Modules accepted: Orders

## 2022-02-17 NOTE — Patient Instructions (Signed)
Medication Instructions:  Your physician recommends that you continue on your current medications as directed. Please refer to the Current Medication list given to you today.  *If you need a refill on your cardiac medications before your next appointment, please call your pharmacy*   Lab Work: None ordered.  If you have labs (blood work) drawn today and your tests are completely normal, you will receive your results only by: [facility] (if you have MyChart) OR A paper copy in the mail If you have any lab test that is abnormal or we need to change your treatment, we will call you to review the results.   Testing/Procedures: Your physician has recommended that you have an ablation. Catheter ablation is a medical procedure used to treat some cardiac arrhythmias (irregular heartbeats). During catheter ablation, a long, thin, flexible tube is put into a blood vessel in your groin (upper thigh), or neck. This tube is called an ablation catheter. It is then guided to your heart through the blood vessel. Radio frequency waves destroy small areas of heart tissue where abnormal heartbeats may cause an arrhythmia to start. Please see the instruction sheet given to you today.     Follow-Up: At [facility], you and your health needs are our priority.  As part of our continuing mission to provide you with exceptional heart care, we have created designated Provider Care Teams.  These Care Teams include your primary Cardiologist (physician) and Advanced Practice Providers (APPs -  Physician Assistants and Nurse Practitioners) who all work together to provide you with the care you need, when you need it.  We recommend signing up for the patient portal called "MyChart".  Sign up information is provided on this After Visit Summary.  MyChart is used to connect with patients for Virtual Visits (Telemedicine).  Patients are able to view lab/test results, encounter notes, upcoming appointments, etc.   Non-urgent messages can be sent to your provider as well.   To learn more about what you can do with MyChart, go to [email or web address].    Your next appointment:   To be scheduled  Important Information About Sugar

## 2022-02-24 ENCOUNTER — Other Ambulatory Visit (HOSPITAL_COMMUNITY): Payer: Self-pay | Admitting: *Deleted

## 2022-02-24 MED ORDER — CARVEDILOL 6.25 MG PO TABS: 6.2500 mg | ORAL_TABLET | Freq: Two times a day (BID) | ORAL | 3 refills | Status: DC

## 2022-02-27 NOTE — Telephone Encounter (Signed)
Work up for Ablation is complete.   Pt is scheduled with AT on 06/13/22 for H&P.Marland KitchenMarland Kitchen

## 2022-04-22 ENCOUNTER — Telehealth (HOSPITAL_BASED_OUTPATIENT_CLINIC_OR_DEPARTMENT_OTHER): Payer: Self-pay | Admitting: Cardiovascular Disease

## 2022-04-22 NOTE — Telephone Encounter (Signed)
Left message for patient to call and discuss scheduling the Echocardiogram ordered by Dr. Oval Linsey

## 2022-05-05 ENCOUNTER — Telehealth (HOSPITAL_BASED_OUTPATIENT_CLINIC_OR_DEPARTMENT_OTHER): Payer: Self-pay

## 2022-05-05 MED ORDER — EMPAGLIFLOZIN 10 MG PO TABS: 10.0000 mg | ORAL_TABLET | Freq: Every day | ORAL | 1 refills | Status: DC

## 2022-05-05 NOTE — Telephone Encounter (Signed)
Received fax from [facility] requesting refills for Jardiance 10 mg. Rx request sent to pharmacy.

## 2022-05-06 ENCOUNTER — Encounter (HOSPITAL_BASED_OUTPATIENT_CLINIC_OR_DEPARTMENT_OTHER): Payer: Self-pay | Admitting: Family

## 2022-05-06 ENCOUNTER — Ambulatory Visit (HOSPITAL_BASED_OUTPATIENT_CLINIC_OR_DEPARTMENT_OTHER): Payer: Medicare Other | Admitting: Family

## 2022-05-06 VITALS — BP 128/70 | HR 67 | Ht 67.0 in | Wt 205.5 lb

## 2022-05-06 DIAGNOSIS — E785 Hyperlipidemia, unspecified: Secondary | ICD-10-CM | POA: Diagnosis not present

## 2022-05-06 DIAGNOSIS — D6859 Other primary thrombophilia: Secondary | ICD-10-CM

## 2022-05-06 DIAGNOSIS — I77810 Thoracic aortic ectasia: Secondary | ICD-10-CM

## 2022-05-06 DIAGNOSIS — I4819 Other persistent atrial fibrillation: Secondary | ICD-10-CM | POA: Diagnosis not present

## 2022-05-06 DIAGNOSIS — I5042 Chronic combined systolic (congestive) and diastolic (congestive) heart failure: Secondary | ICD-10-CM

## 2022-05-06 DIAGNOSIS — I25118 Atherosclerotic heart disease of native coronary artery with other forms of angina pectoris: Secondary | ICD-10-CM

## 2022-05-06 NOTE — Progress Notes (Signed)
Office Visit    Patient Name: Douglas [name] Date of Encounter: 05/06/2022  PCP:  [name], MD   [facility]  Cardiologist:  [name], MD  Advanced Practice Provider:  No care team member to display Electrophysiologist:  None      Chief Complaint    Douglas [name] is a 77 y.o. male presents today for follow up of CAD   Past Medical History    Past Medical History:  Diagnosis Date   Acute on chronic diastolic heart failure ([place]) 01/11/2020   Anemia    Arthritis    "joints pains; comes w/age" (05/09/2014)   Ascending aortic aneurysm ([place]) 10/31/2021   Chronic combined systolic and diastolic heart failure ([place]) 01/11/2020   Chronic combined systolic and diastolic heart failure ([place]) 01/11/2020   Coronary artery disease    Essential hypertension 01/11/2020   GERD (gastroesophageal reflux disease)    H/O hiatal hernia 1980   Heart attack ([place]) 10/29/05   anteroseptal myocardial infaction Taxus stent to LAD   History of blood transfusion 1980?   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   Hyperlipidemia    Hypertension    Ischemic heart disease    Lower extremity edema 08/23/2015   OSA on CPAP    Pneumonia    S/P coronary artery stent placement 02/14/2021   Skin cancer    "burnt most of them off; face, arms"   Past Surgical History:  Procedure Laterality Date   CARDIOVASCULAR STRESS TEST Left 05/28/2009   EF 57% no reversible ischemia   CARDIOVERSION N/A 07/12/2020   Procedure: CARDIOVERSION;  Surgeon: Donato Heinz, MD;  Location: [place];  Service: Cardiovascular;  Laterality: N/A;   CATARACT EXTRACTION W/ INTRAOCULAR LENS IMPLANT Left 1997   CORONARY ANGIOPLASTY WITH STENT PLACEMENT Bilateral 2007   "1"   CORONARY STENT INTERVENTION N/A 05/11/2018   Procedure: CORONARY STENT INTERVENTION;  Surgeon: Leonie Man, MD;  Location: [place] [facility];  Service: Cardiovascular;  Laterality: N/A;   DOPPLER ECHOCARDIOGRAPHY  01/06/2007   ESOPHAGEAL MANOMETRY N/A 01/05/2019   Procedure: ESOPHAGEAL MANOMETRY (EM);  Surgeon: Laurence Spates, MD;  [facility];  Service: Endoscopy;  Laterality: N/A;   EYE SURGERY Left 1978   "injury"   HERNIA REPAIR     INGUINAL HERNIA REPAIR Bilateral 05/09/2014   INGUINAL HERNIA REPAIR Bilateral 05/09/2014   Procedure: LAPAROSCOPIC BILATERAL INGUINAL HERNIA REPAIR;  Surgeon: [name], MD;  Location: [place];  Service: General;  Laterality: Bilateral;   INSERTION OF MESH N/A 05/09/2014   Procedure: INSERTION OF MESH;  Surgeon: [name], MD;  Location: [place];  Service: General;  Laterality: N/A;  INSERTION OF MESH TO ABDOMEN AND BILATERAL GROIN   LEFT HEART CATH AND CORONARY ANGIOGRAPHY N/A 05/11/2018   Procedure: LEFT HEART CATH AND CORONARY ANGIOGRAPHY;  Surgeon: Leonie Man, MD;  Location: [place] [facility];  Service: Cardiovascular;  Laterality: N/A;   SHOULDER ARTHROSCOPY W/ ROTATOR CUFF REPAIR Right 06/2010   SKIN CANCER EXCISION Left    "arm"   TEE WITHOUT CARDIOVERSION N/A 07/12/2020   Procedure: TRANSESOPHAGEAL ECHOCARDIOGRAM (TEE);  Surgeon: Donato Heinz, MD;  Location: [place];  Service: Cardiovascular;  Laterality: N/A;   TONSILLECTOMY  7341   UMBILICAL HERNIA REPAIR  9/37/9024   UMBILICAL HERNIA REPAIR N/A 05/09/2014  Procedure: HERNIA REPAIR UMBILICAL ADULT;  Surgeon: [name], MD;  Location: [place];  Service: General;  Laterality: N/A;    Allergies  Allergies  Allergen Reactions   Ace Inhibitors Cough   Bee Venom     Other reaction(s): swelling   Lisinopril Cough   Losartan Cough   Other Swelling    History of Present Illness    Willam [name] is a 77 y.o. male with a hx of CAD s/p  MI and PCI (LAD 2007, RCA 2020), hiatal hernia repair 03/2019 persistent atrial fibrillation, chronic combined systolic  and diastolic heart failure.  Hypertension, hyperlipidemia, chronic occult GI bleed, OSA on CPAP. Last seen 02/17/22 by Dr. Curt Bears.  Amlodipine previously reduced due to hypotension after 30 pound weight loss associated with hiatal hernia surgery to also 2020.  Today atrial fibrillation 06/2020.  TEE cardioversion with LVEF 45-50% with apical hypokinesis.  He had recurrent atrial fibrillation 07/2020.  08/2020 was started on Tikosyn.  He saw Dr. Oval Linsey 10/31/2021 and Vania Rea was added for optimization for heart failure therapy.  Last saw Dr. Curt Bears 02/17/2022.  It was decided to proceed with ablation which is scheduled for 06/2022.   He presents today for follow-up with his wife. Feeling overall well.  Monitoring blood pressure at home.  He will rarely have hypotensive reading but this is asymptomatic with no lightheadedness or dizziness. Reports no shortness of breath at rest and stable mild dyspnea on exertion. Reports no chest pain, pressure, or tightness. No edema, orthopnea, PND.  In regards to his atrial fibrillation he notes he is most bothered with more than usual activity when he is in atrial fibrillation but is often not sure if he is in atrial fibrillation or normal sinus rhythm.  EKGs/Labs/Other Studies Reviewed:   The following studies were reviewed today:   EKG:  EKG is not ordered today.  Recent Labs: 02/04/2022: BUN 19; Creatinine, Ser 1.07; Magnesium 2.2; Potassium 4.4; Sodium 138  Recent Lipid Panel    Component Value Date/Time   CHOL 114 02/14/2021 1025   TRIG 96 02/14/2021 1025   HDL 30 (L) 02/14/2021 1025   CHOLHDL 3.8 02/14/2021 1025   CHOLHDL 3.7 04/19/2015 0801   VLDL 17 04/19/2015 0801   LDLCALC 66 02/14/2021 1025    Risk Assessment/Calculations:   CHA2DS2-VASc Score = 5   This indicates a 7.2% annual risk of stroke. The patient's score is based upon: CHF History: 1 HTN History: 1 Diabetes History: 0 Stroke History: 0 Vascular Disease History:  1 Age Score: 2 Gender Score: 0   Home Medications   Current Meds  Medication Sig   acetaminophen (TYLENOL) 500 MG tablet Take 500-1,000 mg by mouth every 6 (six) hours as needed (for pain.).   amLODipine (NORVASC) 5 MG tablet Take 1 tablet (5 mg total) by mouth daily.   apixaban (ELIQUIS) 5 MG TABS tablet Take 1 tablet (5 mg total) by mouth 2 (two) times daily.   carvedilol (COREG) 6.25 MG tablet Take 1 tablet (6.25 mg total) by mouth 2 (two) times daily with a meal.   Cholecalciferol (VITAMIN D) 2000 UNITS tablet Take 2,000 Units by mouth daily.   colchicine 0.6 MG tablet Take 0.6 mg by mouth 2 (two) times daily as needed (gout). Colcrys   dofetilide (TIKOSYN) 500 MCG capsule Take 1 capsule (500 mcg total) by mouth 2 (two) times daily.   doxycycline (MONODOX) 100 MG capsule Take 100 mg by mouth 2 (two) times daily.   empagliflozin (JARDIANCE)  10 MG TABS tablet Take 1 tablet (10 mg total) by mouth daily before breakfast.   EPINEPHrine 0.3 mg/0.3 mL IJ SOAJ injection Inject 0.3 mg into the muscle as needed for allergies.   fexofenadine (ALLEGRA) 180 MG tablet Take 180 mg by mouth daily as needed for allergies.   furosemide (LASIX) 40 MG tablet TAKE 1 TABLET BY MOUTH DAILY AS NEEDED FOR FLUID OR EDEMA   metroNIDAZOLE (METROGEL) 1 % gel Apply 1 Application topically daily.   nitroGLYCERIN (NITROSTAT) 0.4 MG SL tablet Place 0.4 mg under the tongue every 5 (five) minutes as needed for chest pain.   omeprazole (PRILOSEC) 20 MG capsule Take 20 mg by mouth daily.   ondansetron (ZOFRAN) 4 MG tablet Take 4 mg by mouth as needed.   simvastatin (ZOCOR) 20 MG tablet TAKE 1 TABLET BY MOUTH EVERYDAY AT BEDTIME   testosterone cypionate (DEPOTESTOSTERONE CYPIONATE) 200 MG/ML injection SMARTSIG:0.5-2 Milliliter(s) IM Every 2 Weeks PRN   valACYclovir (VALTREX) 1000 MG tablet Take 1,000 mg by mouth 2 (two) times daily as needed (fever blisters).     Review of Systems      All other systems reviewed and  are otherwise negative except as noted above.  Physical Exam    VS:  BP 128/70 (BP Location: Right Arm, Patient Position: Sitting, Cuff Size: Large)   Pulse 67   Ht '5\' 7"'$  (1.702 m)   Wt 205 lb 8 oz (93.2 kg)   SpO2 96%   BMI 32.19 kg/m  , BMI Body mass index is 32.19 kg/m.  Wt Readings from Last 3 Encounters:  05/06/22 205 lb 8 oz (93.2 kg)  02/17/22 204 lb 3.2 oz (92.6 kg)  02/04/22 204 lb 12.8 oz (92.9 kg)     GEN: Well nourished, well developed, in no acute distress. HEENT: normal. Neck: Supple, no JVD, carotid bruits, or masses. Cardiac: RRR, no murmurs, rubs, or gallops. No clubbing, cyanosis, edema.  Radials/PT 2+ and equal bilaterally.  Respiratory:  Respirations regular and unlabored, clear to auscultation bilaterally. GI: Soft, nontender, nondistended. MS: No deformity or atrophy. Skin: Warm and dry, no rash. Neuro:  Strength and sensation are intact. Psych: Normal affect.  Assessment & Plan    Chronic combined systolic and diastolic heart failure- Euvolemic and well compensated on exam.  GDMT includes carvedilol, Jardiance, furosemide as needed.  He is ACE/ARB allergy and would advise against addition of ARNI.  Could consider MRI in the future however I will defer today as he is having some hypotension at home. Low sodium diet, fluid restriction <2L, and daily weights encouraged. Educated to contact our office for weight gain of 2 lbs overnight or 5 lbs in one week.   Ascending aortic aneurysm-continue optimal blood pressure control.  CT 11/08/2021 with ascending thoracic aneurysm 4.5 cm.  Has echo upcoming in 05/12/2022 for monitoring.  Recommend avoidance of fluoroquinolones.  PAF/hypercoagulable state/high risk medication use - Has ablation upcoming. NSR by auscultation today.  Tolerating Tikosyn and Eliquis without difficulty.  Continue present dose carvedilol.  CBC/BMP upcoming as part of preablation workup as well as CT  OSA- CPAP compliance encouraged.   CAD s/p  CABG- Stable with no anginal symptoms. No indication for ischemic evaluation.  GDMT includes carvedilol, simvastatin.  No aspirin due to chronic anticoagulation. Heart healthy diet and regular cardiovascular exercise encouraged.           Disposition: Follow up  in 6-8 months  with [name], MD or APP.  Signed, Loel Dubonnet,  NP 05/06/2022, 4:52 PM [facility]

## 2022-05-06 NOTE — Patient Instructions (Signed)
Medication Instructions:  Continue your same medications.  *If you need a refill on your cardiac medications before your next appointment, please call your pharmacy*   Lab Work/Testing/Procedures: Labs, CT scan, and echocardiogram as scheduled.    Follow-Up: At [facility], you and your health needs are our priority.  As part of our continuing mission to provide you with exceptional heart care, we have created designated Provider Care Teams.  These Care Teams include your primary Cardiologist (physician) and Advanced Practice Providers (APPs -  Physician Assistants and Nurse Practitioners) who all work together to provide you with the care you need, when you need it.  We recommend signing up for the patient portal called "MyChart".  Sign up information is provided on this After Visit Summary.  MyChart is used to connect with patients for Virtual Visits (Telemedicine).  Patients are able to view lab/test results, encounter notes, upcoming appointments, etc.  Non-urgent messages can be sent to your provider as well.   To learn more about what you can do with MyChart, go to [email or web address].    Your next appointment:   With Dr. Oval Linsey in 6-8 months  Other Instructions  Heart Healthy Diet Recommendations: A low-salt diet is recommended. Meats should be grilled, baked, or boiled. Avoid fried foods. Focus on lean protein sources like fish or chicken with vegetables and fruits. The American Heart Association is a Microbiologist!  American Heart Association Diet and Lifeystyle Recommendations   Exercise recommendations: The American Heart Association recommends 150 minutes of moderate intensity exercise weekly. Try 30 minutes of moderate intensity exercise 4-5 times per week. This could include walking, jogging, or swimming.

## 2022-05-12 ENCOUNTER — Ambulatory Visit (INDEPENDENT_AMBULATORY_CARE_PROVIDER_SITE_OTHER): Payer: Medicare Other

## 2022-05-12 DIAGNOSIS — I081 Rheumatic disorders of both mitral and tricuspid valves: Secondary | ICD-10-CM

## 2022-05-12 DIAGNOSIS — I77810 Thoracic aortic ectasia: Secondary | ICD-10-CM

## 2022-05-12 DIAGNOSIS — I517 Cardiomegaly: Secondary | ICD-10-CM

## 2022-05-12 DIAGNOSIS — I7121 Aneurysm of the ascending aorta, without rupture: Secondary | ICD-10-CM | POA: Diagnosis not present

## 2022-05-12 LAB — ECHOCARDIOGRAM COMPLETE
Area-P 1/2: 2.8 cm2
MV M vel: 4.9 m/s
MV Peak grad: 96 mmHg
P 1/2 time: 600 msec
S' Lateral: 2.95 cm

## 2022-05-12 MED ORDER — PERFLUTREN LIPID MICROSPHERE
1.0000 mL | INTRAVENOUS | Status: AC | PRN
  Administered 2022-05-12: 1 mL via INTRAVENOUS

## 2022-05-14 ENCOUNTER — Telehealth: Payer: Self-pay

## 2022-05-14 NOTE — Telephone Encounter (Signed)
Pt is returning call. Transferred to April, Shelly.

## 2022-05-14 NOTE — Telephone Encounter (Signed)
LM for pt to call back to update him on time change of procedure.

## 2022-05-14 NOTE — Telephone Encounter (Signed)
Pt is aware of his procedure time change from 1:30 to 10:30.

## 2022-05-23 ENCOUNTER — Encounter: Payer: Self-pay | Admitting: *Deleted

## 2022-05-23 NOTE — Telephone Encounter (Signed)
Apologized for another time to arrive change, and informed pt why. He understands to be there at 7:30 on 3/8. Aware updated ablation instructions will be sent via mychart Patient verbalized understanding and agreeable to plan.

## 2022-05-29 ENCOUNTER — Encounter (HOSPITAL_COMMUNITY): Payer: Self-pay | Admitting: *Deleted

## 2022-05-30 ENCOUNTER — Telehealth (HOSPITAL_BASED_OUTPATIENT_CLINIC_OR_DEPARTMENT_OTHER): Payer: Self-pay

## 2022-05-30 DIAGNOSIS — I77810 Thoracic aortic ectasia: Secondary | ICD-10-CM

## 2022-05-30 DIAGNOSIS — I7121 Aneurysm of the ascending aorta, without rupture: Secondary | ICD-10-CM

## 2022-05-30 NOTE — Telephone Encounter (Addendum)
Seen by patient Douglas [name] on 05/29/2022 10:01 PM; orders placed and follow up mychart message sent to patient    ----- Message from Loel Dubonnet, NP sent at 05/29/2022  9:57 PM EST ----- Echocardiogram with normal heart muscle function. Mild to moderate leaking of mitral and tricuspid valve. Ascending aorta measures 55m. Would recommend repeat echocardiogram in 1 year for monitoring of heart valve. Recommend repeat CT aorta in 6 months for monitoring of aortic aneurysm.

## 2022-06-09 ENCOUNTER — Other Ambulatory Visit (HOSPITAL_COMMUNITY): Payer: Self-pay | Admitting: Nurse Practitioner

## 2022-06-12 NOTE — Progress Notes (Signed)
Cardiology Office Note Date:  06/13/2022  Patient ID:  Douglas [name], Was 1945-12-27, MRN [id number] PCP:  [name], Douglas [name]  Cardiologist:  [name], Douglas [name] Electrophysiologist: Constance Haw, Douglas [name]  Chief Complaint: pre-op Afib ablation  History of Present Illness: Douglas [name] is a 77 y.o. male with PMH notable for diastolic HF, parox Afib, AAA, CAD s/p LAD stent and CABG, HTN, OSA on CPAP; seen today for Douglas [name], Douglas [name] for routine electrophysiology followup.  He last saw Dr. Curt Bears 01/2022 for evaluation of parox AF, on tikosyn, but having breakthrough episodes. AF ablation was discussed and patient is scheduled for procedure on 06/27/22.  Today, the patient feels well. Is unaware that he is in Afib. Has some intermittent palpitations, but nothing overly concerning to him.  He diligently uses CPAP at night. Takes eliquis and tikosyn BID, uses alarms to remind him. No bleeding concerns.  He checks his BP at home, readings are usually 120-130/70s.   he denies chest pain, dyspnea, PND, orthopnea, nausea, vomiting, dizziness, syncope, edema, weight gain, or early satiety.    AAD History: Tikosyn '500mg'$  BID  Past Medical History:  Diagnosis Date   Acute on chronic diastolic heart failure ([place]) 01/11/2020   Anemia    Arthritis    "joints pains; comes w/age" (05/09/2014)   Ascending aortic aneurysm ([facility]) 10/31/2021   Chronic combined systolic and diastolic heart failure ([place]) 01/11/2020   Chronic combined systolic and diastolic heart failure ([place]) 01/11/2020   Coronary artery disease    Essential hypertension 01/11/2020   GERD (gastroesophageal reflux disease)    H/O hiatal hernia 1980   Heart attack ([place]) 10/29/05   anteroseptal myocardial infaction Taxus stent to LAD   History of blood transfusion 1980?   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    Hyperlipidemia    Hypertension    Ischemic heart disease    Lower extremity edema 08/23/2015   OSA on CPAP    Pneumonia    S/P coronary artery stent placement 02/14/2021   Skin cancer    "burnt most of them off; face, arms"    Past Surgical History:  Procedure Laterality Date   CARDIOVASCULAR STRESS TEST Left 05/28/2009   EF 57% no reversible ischemia   CARDIOVERSION N/A 07/12/2020   Procedure: CARDIOVERSION;  Surgeon: Donato Heinz, Douglas [name];  Location: [place];  Service: Cardiovascular;  Laterality: N/A;   CATARACT EXTRACTION W/ INTRAOCULAR LENS IMPLANT Left [place] Bilateral 2007   "1"   CORONARY STENT INTERVENTION N/A 05/11/2018   Procedure: CORONARY STENT INTERVENTION;  Surgeon: Leonie Man, Douglas [name];  Location: [place] [facility];  Service: Cardiovascular;  Laterality: N/A;   DOPPLER ECHOCARDIOGRAPHY  01/06/2007   ESOPHAGEAL MANOMETRY N/A 01/05/2019   Procedure: ESOPHAGEAL MANOMETRY (EM);  Surgeon: Laurence Spates, Douglas [name];  [facility];  Service: Endoscopy;  Laterality: N/A;   EYE SURGERY Left 1978   "injury"   HERNIA REPAIR     INGUINAL HERNIA REPAIR Bilateral 05/09/2014   INGUINAL HERNIA REPAIR Bilateral 05/09/2014   Procedure: LAPAROSCOPIC BILATERAL INGUINAL HERNIA REPAIR;  Surgeon: [name], Douglas [name];  Location: [place];  Service: General;  Laterality: Bilateral;   INSERTION OF MESH N/A 05/09/2014   Procedure: INSERTION OF MESH;  Surgeon: [name], Douglas [name];  Location: [place];  Service: General;  Laterality: N/A;  INSERTION OF MESH TO ABDOMEN AND BILATERAL GROIN   LEFT HEART CATH AND CORONARY ANGIOGRAPHY N/A 05/11/2018   Procedure: LEFT HEART CATH AND CORONARY ANGIOGRAPHY;  Surgeon: Leonie Man, Douglas [name];  Location: [place] [facility];  Service: Cardiovascular;  Laterality: N/A;   SHOULDER ARTHROSCOPY W/ ROTATOR CUFF REPAIR Right 06/2010   SKIN CANCER EXCISION Left    "arm"   TEE WITHOUT CARDIOVERSION N/A 07/12/2020   Procedure:  TRANSESOPHAGEAL ECHOCARDIOGRAM (TEE);  Surgeon: Donato Heinz, Douglas [name];  Location: [place];  Service: Cardiovascular;  Laterality: N/A;   TONSILLECTOMY  [id number]   UMBILICAL HERNIA REPAIR  [id number]   UMBILICAL HERNIA REPAIR N/A 05/09/2014   Procedure: HERNIA REPAIR UMBILICAL ADULT;  Surgeon: [name], Douglas [name];  Location: [place];  Service: General;  Laterality: N/A;    Current Outpatient Medications  Medication Instructions   acetaminophen (TYLENOL) 500-1,000 mg, Oral, Every 6 hours PRN   amLODipine (NORVASC) 5 mg, Oral, Daily   apixaban (ELIQUIS) 5 mg, Oral, 2 times daily   carvedilol (COREG) 6.25 mg, Oral, 2 times daily with meals   colchicine 0.6 mg, Oral, 2 times daily PRN, Colcrys   dofetilide (TIKOSYN) 500 mcg, Oral, 2 times daily   empagliflozin (JARDIANCE) 10 mg, Oral, Daily before breakfast   EPINEPHrine (EPI-PEN) 0.3 mg, Intramuscular, As needed   fexofenadine (ALLEGRA) 180 mg, Oral, Daily PRN   furosemide (LASIX) 40 MG tablet TAKE 1 TABLET BY MOUTH DAILY AS NEEDED FOR FLUID OR EDEMA   metroNIDAZOLE (METROGEL) 1 % gel 1 Application, Topical, Daily   nitroGLYCERIN (NITROSTAT) 0.4 mg, Sublingual, Every 5 min PRN   omeprazole (PRILOSEC) 20 mg, Oral, Daily   simvastatin (ZOCOR) 20 MG tablet TAKE 1 TABLET BY MOUTH EVERYDAY AT BEDTIME   testosterone cypionate (DEPOTESTOSTERONE CYPIONATE) 200 MG/ML injection SMARTSIG:0.5-2 Milliliter(s) IM Every 2 Weeks PRN   valACYclovir (VALTREX) 1,000 mg, Oral, 2 times daily PRN   Vitamin D 2,000 Units, Oral, Daily,      Social History:  The patient  reports that he quit smoking about 54 years ago. His smoking use included cigarettes. He has a 5.00 pack-year smoking history. He quit smokeless tobacco use about 44 years ago.  His smokeless tobacco use included chew. He reports that he does not currently use alcohol. He reports that he does not use drugs.   Family History:  The patient's family history includes Diabetes in his father and mother;  Heart disease in his father and mother.  ROS:  Please see the history of present illness. All other systems are reviewed and otherwise negative.   PHYSICAL EXAM:  VS:  BP 122/74   Pulse 73   Ht '5\' 7"'$  (1.702 m)   Wt 198 lb (89.8 kg)   SpO2 97%   BMI 31.01 kg/m  BMI: Body mass index is 31.01 kg/m.  GEN- The patient is well appearing, alert and oriented x 3 today.   HEENT: normocephalic, atraumatic; sclera clear, conjunctiva pink; hearing intact; oropharynx clear; neck supple, no JVP Lungs- Clear to ausculation bilaterally, normal work of breathing.  No wheezes, rales, rhonchi Heart- Irregularly irregular rate and rhythm, no murmurs, rubs or gallops, PMI not laterally displaced GI- soft, non-tender, non-distended, bowel sounds present, no hepatosplenomegaly Extremities- 1+ peripheral edema. no clubbing or cyanosis; DP/PT/radial pulses 2+ bilaterally MS- no significant deformity or atrophy Skin- warm and dry, no rash or lesion Psych- euthymic mood, full affect Neuro- strength and sensation are intact   EKG is ordered. Personal review of EKG from  today shows:  Coarse Afib, rate 73bpm, LAD, Poor RWP By my calculation, QT 416m, QTC 4636m Recent Labs: 02/04/2022: BUN 19; Creatinine, Ser 1.07; Magnesium 2.2; Potassium 4.4; Sodium 138  No results found for requested labs within last 365 days.   CrCl cannot be calculated (Patient's most recent lab result is older than the maximum 21 days allowed.).   Wt Readings from Last 3 Encounters:  06/13/22 198 lb (89.8 kg)  05/06/22 205 lb 8 oz (93.2 kg)  02/17/22 204 lb 3.2 oz (92.6 kg)     Additional studies reviewed include: Previous EP, cardiology notes.   TTE 05/12/2022  1. Left ventricular ejection fraction, by estimation, is 55 to 60%. The left ventricle has normal function. The left ventricle has no regional wall motion abnormalities. Left ventricular diastolic parameters are indeterminate.   2. Right ventricular systolic function  is normal. The right ventricular size is normal.   3. Left atrial size was moderately dilated.   4. The mitral valve is normal in structure. Mild to moderate mitral valve regurgitation. No evidence of mitral stenosis.   5. Tricuspid valve regurgitation is mild to moderate.   6. The aortic valve is tricuspid. Aortic valve regurgitation is trivial. No aortic stenosis is present.   7. Aneurysm of the ascending aorta, measuring 46 mm.   8. The inferior vena cava is normal in size with greater than 50% respiratory variability, suggesting right atrial pressure of 3 mmHg.   CTA chest, aorta, 11/08/2021 1. Ascending thoracic aortic aneurysm measuring 4.5 cm. Recommend semi-annual imaging followup by CTA or MRA and referral to cardiothoracic surgery if not already obtained. This recommendation follows 2010 ACCF/AHA/AATS/ACR/ASA/SCA/SCAI/SIR/STS/SVM Guidelines for the Diagnosis and Management of Patients With Thoracic Aortic  Disease. Circulation. 2010; 121: [id number] aneurysm NOS (ICD10-I71.9) 2. Mild bronchial wall thickening and air trapping. 3. Cholelithiasis without cholecystitis. 4. Moderate hiatal hernia, decreased from 2020. 5.  Aortic Atherosclerosis (ICD10-I70.0).  LHC, 05/11/2018 Two-vessel disease with widely patent stent in the proximal LAD and 75-80% proximal RCA stenosis. Successful DES PCI of proximal RCA using [place] DES 3.0 mm x 18 mm postdilated to 3.6 mm. Moderately elevated LVEDP. Low normal EF of 45 and 50%.  Difficult to assess regional wall motion normality.  ASSESSMENT AND PLAN:  #) parox AFib Currently on tikosyn 5005mBID, tolerating well but having breakthrough Afib episodes QTC [id number] for Afib ablation 3/8 w Dr. CamnCurt Bears questions answered - pre-op labs today  #) Hypercoag d/t AF CHA2DS2-VASc Score = 5 [CHF History: 1, HTN History: 1, Diabetes History: 0, Stroke History: 0, Vascular Disease History: 1, Age Score: 2, Gender Score: 0].  Therefore, the  patient's annual risk of stroke is 7.2 %. OAC [place] '5mg'$  BID, appropriately dosed    }  #) OSA CPAP compliance encouraged  #) CHF Increased dependent edema, patient admits to dietary indiscretion yesterday Encouraged low-salt diet Cont coreg, jardiance Diuretic - lasix PRN. I recommended he take a dose today  #) CAD No symptoms   Current medicines are reviewed at length with the patient today.   The patient does not have concerns regarding his medicines.  The following changes were made today:  none  Labs/ tests ordered today include:  Orders Placed This Encounter  Procedures   EKG 12-Lead    Disposition: Follow up with Afib Clinic in as usual post procedure   Signed, SuzaMamie Levers  06/13/22  12:33 PM  Electrophysiology [facility]
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
## 2022-06-13 ENCOUNTER — Encounter: Payer: Self-pay | Admitting: Student

## 2022-06-13 ENCOUNTER — Ambulatory Visit: Payer: Medicare Other | Admitting: Cardiology

## 2022-06-13 ENCOUNTER — Ambulatory Visit: Payer: Medicare Other | Attending: Cardiology

## 2022-06-13 VITALS — BP 122/74 | HR 73 | Ht 67.0 in | Wt 198.0 lb

## 2022-06-13 DIAGNOSIS — I480 Paroxysmal atrial fibrillation: Secondary | ICD-10-CM

## 2022-06-13 DIAGNOSIS — I10 Essential (primary) hypertension: Secondary | ICD-10-CM

## 2022-06-13 DIAGNOSIS — I5042 Chronic combined systolic (congestive) and diastolic (congestive) heart failure: Secondary | ICD-10-CM

## 2022-06-13 DIAGNOSIS — I255 Ischemic cardiomyopathy: Secondary | ICD-10-CM

## 2022-06-13 DIAGNOSIS — I7121 Aneurysm of the ascending aorta, without rupture: Secondary | ICD-10-CM

## 2022-06-13 DIAGNOSIS — I4819 Other persistent atrial fibrillation: Secondary | ICD-10-CM

## 2022-06-13 LAB — BASIC METABOLIC PANEL
BUN/Creatinine Ratio: 23 (ref 10–24)
BUN: 28 mg/dL — ABNORMAL HIGH (ref 8–27)
CO2: 22 mmol/L (ref 20–29)
Calcium: 8.8 mg/dL (ref 8.6–10.2)
Chloride: 105 mmol/L (ref 96–106)
Creatinine, Ser: 1.21 mg/dL (ref 0.76–1.27)
Glucose: 102 mg/dL — ABNORMAL HIGH (ref 70–99)
Potassium: 4.7 mmol/L (ref 3.5–5.2)
Sodium: 136 mmol/L (ref 134–144)
eGFR: 62 mL/min/{1.73_m2} (ref >59)

## 2022-06-13 LAB — CBC
Hematocrit: 50.1 % (ref 37.5–51.0)
Hemoglobin: 16.7 g/dL (ref 13.0–17.7)
MCH: 25.2 pg — ABNORMAL LOW (ref 26.6–33.0)
MCHC: 33.3 g/dL (ref 31.5–35.7)
MCV: 76 fL — ABNORMAL LOW (ref 79–97)
Platelets: 179 10*3/uL (ref 150–450)
RBC: 6.63 x10E6/uL — ABNORMAL HIGH (ref 4.14–5.80)
RDW: 23.4 % — ABNORMAL HIGH (ref 11.6–15.4)
WBC: 7.3 10*3/uL (ref 3.4–10.8)

## 2022-06-13 NOTE — Patient Instructions (Signed)
Medication Instructions:  Your physician recommends that you continue on your current medications as directed. Please refer to the Current Medication list given to you today.  *If you need a refill on your cardiac medications before your next appointment, please call your pharmacy   Follow-Up: At  [facility], you and your health needs are our priority.  As part of our continuing mission to provide you with exceptional heart care, we have created designated Provider Care Teams.  These Care Teams include your primary Cardiologist (physician) and Advanced Practice Providers (APPs -  Physician Assistants and Nurse Practitioners) who all work together to provide you with the care you need, when you need it.  Your next appointment:   As scheduled  

## 2022-06-18 ENCOUNTER — Telehealth (HOSPITAL_COMMUNITY): Payer: Self-pay | Admitting: *Deleted

## 2022-06-18 NOTE — Telephone Encounter (Signed)
Reaching out to patient to offer assistance regarding upcoming cardiac imaging study; pt verbalizes understanding of appt date/time, parking situation and where to check in, pre-test NPO status and verified current allergies; name and call back number provided for further questions should they arise  Douglas Clement RN Navigator Cardiac Imaging [name] [phone] office [phone] cell  Patient to take his daily medications.

## 2022-06-19 ENCOUNTER — Encounter (HOSPITAL_BASED_OUTPATIENT_CLINIC_OR_DEPARTMENT_OTHER): Payer: Self-pay

## 2022-06-19 ENCOUNTER — Ambulatory Visit (HOSPITAL_BASED_OUTPATIENT_CLINIC_OR_DEPARTMENT_OTHER)
Admission: RE | Admit: 2022-06-19 | Discharge: 2022-06-19 | Disposition: A | Discharge status: Home / Self Care | Payer: Medicare Other | Source: Ambulatory Visit | Attending: Cardiology | Admitting: Cardiology

## 2022-06-19 DIAGNOSIS — I4819 Other persistent atrial fibrillation: Secondary | ICD-10-CM | POA: Diagnosis present

## 2022-06-19 MED ORDER — IOHEXOL 350 MG/ML SOLN
100.0000 mL | Freq: Once | INTRAVENOUS | Status: AC | PRN
  Administered 2022-06-19: 80 mL via INTRAVENOUS

## 2022-06-23 ENCOUNTER — Telehealth (HOSPITAL_BASED_OUTPATIENT_CLINIC_OR_DEPARTMENT_OTHER): Payer: Self-pay | Admitting: Cardiovascular Disease

## 2022-06-23 NOTE — Telephone Encounter (Signed)
Per Dr. Curt Bears, pt is to hold ALL medications the morning of his procedure.   Pt is aware.

## 2022-06-23 NOTE — Telephone Encounter (Signed)
Patient calling in to speak with you about his ablation and the meds he needs to take. Please advise

## 2022-06-26 NOTE — Pre-Procedure Instructions (Signed)
Instructed patient on the following items: Arrival time 0700 Nothing to eat or drink after midnight No meds AM of procedure Responsible person to drive you home and stay with you for 24 hrs  Have you missed any doses of anti-coagulant Eliquis- if you have missed any doses please let office know right away.

## 2022-06-27 ENCOUNTER — Ambulatory Visit (HOSPITAL_COMMUNITY): Payer: Medicare Other | Admitting: Anesthesiology

## 2022-06-27 ENCOUNTER — Encounter (HOSPITAL_COMMUNITY): Payer: Self-pay | Admitting: Cardiology

## 2022-06-27 ENCOUNTER — Ambulatory Visit (HOSPITAL_COMMUNITY)
Admission: RE | Admit: 2022-06-27 | Discharge: 2022-06-27 | Disposition: A | Discharge status: Home / Self Care | Payer: Medicare Other | Attending: Cardiology | Admitting: Cardiology

## 2022-06-27 ENCOUNTER — Ambulatory Visit (HOSPITAL_BASED_OUTPATIENT_CLINIC_OR_DEPARTMENT_OTHER): Payer: Medicare Other | Admitting: Anesthesiology

## 2022-06-27 ENCOUNTER — Encounter (HOSPITAL_COMMUNITY)
Admission: RE | Disposition: A | Discharge status: Home / Self Care | Payer: Self-pay | Source: Home / Self Care | Attending: Cardiology

## 2022-06-27 ENCOUNTER — Other Ambulatory Visit: Payer: Self-pay

## 2022-06-27 DIAGNOSIS — I252 Old myocardial infarction: Secondary | ICD-10-CM | POA: Diagnosis not present

## 2022-06-27 DIAGNOSIS — Z7901 Long term (current) use of anticoagulants: Secondary | ICD-10-CM | POA: Insufficient documentation

## 2022-06-27 DIAGNOSIS — I10 Essential (primary) hypertension: Secondary | ICD-10-CM | POA: Diagnosis not present

## 2022-06-27 DIAGNOSIS — G4733 Obstructive sleep apnea (adult) (pediatric): Secondary | ICD-10-CM | POA: Insufficient documentation

## 2022-06-27 DIAGNOSIS — I5032 Chronic diastolic (congestive) heart failure: Secondary | ICD-10-CM | POA: Diagnosis not present

## 2022-06-27 DIAGNOSIS — Z87891 Personal history of nicotine dependence: Secondary | ICD-10-CM | POA: Diagnosis not present

## 2022-06-27 DIAGNOSIS — I4891 Unspecified atrial fibrillation: Secondary | ICD-10-CM

## 2022-06-27 DIAGNOSIS — Z79899 Other long term (current) drug therapy: Secondary | ICD-10-CM | POA: Diagnosis not present

## 2022-06-27 DIAGNOSIS — I714 Abdominal aortic aneurysm, without rupture, unspecified: Secondary | ICD-10-CM | POA: Insufficient documentation

## 2022-06-27 DIAGNOSIS — I110 Hypertensive heart disease with heart failure: Secondary | ICD-10-CM | POA: Insufficient documentation

## 2022-06-27 DIAGNOSIS — I483 Typical atrial flutter: Secondary | ICD-10-CM | POA: Insufficient documentation

## 2022-06-27 DIAGNOSIS — Z951 Presence of aortocoronary bypass graft: Secondary | ICD-10-CM | POA: Diagnosis not present

## 2022-06-27 DIAGNOSIS — Z955 Presence of coronary angioplasty implant and graft: Secondary | ICD-10-CM | POA: Insufficient documentation

## 2022-06-27 DIAGNOSIS — I4819 Other persistent atrial fibrillation: Secondary | ICD-10-CM | POA: Insufficient documentation

## 2022-06-27 DIAGNOSIS — I2510 Atherosclerotic heart disease of native coronary artery without angina pectoris: Secondary | ICD-10-CM | POA: Diagnosis not present

## 2022-06-27 HISTORY — PX: ATRIAL FIBRILLATION ABLATION: EP1191

## 2022-06-27 LAB — POCT ACTIVATED CLOTTING TIME
Activated Clotting Time: 304 seconds
Activated Clotting Time: 304 seconds

## 2022-06-27 SURGERY — ATRIAL FIBRILLATION ABLATION
Anesthesia: General

## 2022-06-27 MED ORDER — SUGAMMADEX SODIUM 200 MG/2ML IV SOLN
INTRAVENOUS | Status: DC | PRN
  Administered 2022-06-27: 200 mg via INTRAVENOUS

## 2022-06-27 MED ORDER — ACETAMINOPHEN 325 MG PO TABS: 650.0000 mg | ORAL_TABLET | ORAL | Status: DC | PRN

## 2022-06-27 MED ORDER — ROCURONIUM BROMIDE 100 MG/10ML IV SOLN
INTRAVENOUS | Status: DC | PRN
  Administered 2022-06-27: 100 mg via INTRAVENOUS

## 2022-06-27 MED ORDER — SODIUM CHLORIDE 0.9 % IV SOLN: INTRAVENOUS | Status: DC

## 2022-06-27 MED ORDER — DOBUTAMINE INFUSION FOR EP/ECHO/NUC (1000 MCG/ML)
INTRAVENOUS | Status: DC | PRN
  Administered 2022-06-27: 20 ug/kg/min via INTRAVENOUS

## 2022-06-27 MED ORDER — DEXAMETHASONE SODIUM PHOSPHATE 4 MG/ML IJ SOLN
INTRAMUSCULAR | Status: DC | PRN
  Administered 2022-06-27: 6 mg via INTRAVENOUS

## 2022-06-27 MED ORDER — HEPARIN (PORCINE) IN NACL 1000-0.9 UT/500ML-% IV SOLN
INTRAVENOUS | Status: DC | PRN
  Administered 2022-06-27 (×4): 500 mL

## 2022-06-27 MED ORDER — HEPARIN SODIUM (PORCINE) 1000 UNIT/ML IJ SOLN
INTRAMUSCULAR | Status: DC | PRN
  Administered 2022-06-27: 3000 [IU] via INTRAVENOUS
  Administered 2022-06-27: 14000 [IU] via INTRAVENOUS

## 2022-06-27 MED ORDER — SODIUM CHLORIDE 0.9 % IV SOLN: 250.0000 mL | INTRAVENOUS | Status: DC | PRN

## 2022-06-27 MED ORDER — FENTANYL CITRATE (PF) 100 MCG/2ML IJ SOLN
INTRAMUSCULAR | Status: DC | PRN
  Administered 2022-06-27 (×2): 50 ug via INTRAVENOUS

## 2022-06-27 MED ORDER — PROPOFOL 10 MG/ML IV BOLUS
INTRAVENOUS | Status: DC | PRN
  Administered 2022-06-27: 20 mg via INTRAVENOUS
  Administered 2022-06-27: 150 mg via INTRAVENOUS

## 2022-06-27 MED ORDER — DOBUTAMINE INFUSION FOR EP/ECHO/NUC (1000 MCG/ML)
INTRAVENOUS | Status: AC
  Filled 2022-06-27: qty 250

## 2022-06-27 MED ORDER — SODIUM CHLORIDE 0.9% FLUSH: 3.0000 mL | Freq: Two times a day (BID) | INTRAVENOUS | Status: DC

## 2022-06-27 MED ORDER — SODIUM CHLORIDE 0.9% FLUSH: 3.0000 mL | INTRAVENOUS | Status: DC | PRN

## 2022-06-27 MED ORDER — PROTAMINE SULFATE 10 MG/ML IV SOLN
INTRAVENOUS | Status: DC | PRN
  Administered 2022-06-27: 20 mg via INTRAVENOUS

## 2022-06-27 MED ORDER — ONDANSETRON HCL 4 MG/2ML IJ SOLN
INTRAMUSCULAR | Status: DC | PRN
  Administered 2022-06-27: 4 mg via INTRAVENOUS

## 2022-06-27 MED ORDER — HEPARIN SODIUM (PORCINE) 1000 UNIT/ML IJ SOLN
INTRAMUSCULAR | Status: DC | PRN
  Administered 2022-06-27: 1000 [IU] via INTRAVENOUS

## 2022-06-27 MED ORDER — LIDOCAINE 2% (20 MG/ML) 5 ML SYRINGE
INTRAMUSCULAR | Status: DC | PRN
  Administered 2022-06-27: 60 mg via INTRAVENOUS

## 2022-06-27 MED ORDER — HEPARIN SODIUM (PORCINE) 1000 UNIT/ML IJ SOLN
INTRAMUSCULAR | Status: AC
  Filled 2022-06-27: qty 10

## 2022-06-27 MED ORDER — PHENYLEPHRINE HCL-NACL 20-0.9 MG/250ML-% IV SOLN
INTRAVENOUS | Status: DC | PRN
  Administered 2022-06-27: 50 ug/min via INTRAVENOUS

## 2022-06-27 MED ORDER — ONDANSETRON HCL 4 MG/2ML IJ SOLN: 4.0000 mg | Freq: Four times a day (QID) | INTRAMUSCULAR | Status: DC | PRN

## 2022-06-27 SURGICAL SUPPLY — 18 items
CATH ABLAT QDOT MICRO BI TC DF (CATHETERS) IMPLANT
CATH GE 8FR SOUNDSTAR (CATHETERS) IMPLANT
CATH OCTARAY 2.0 F 3-3-3-3-3 (CATHETERS) IMPLANT
CATH PIGTAIL STEERABLE D1 8.7 (WIRE) IMPLANT
CATH S-M CIRCA TEMP PROBE (CATHETERS) IMPLANT
CATH WEBSTER BI DIR CS D-F CRV (CATHETERS) IMPLANT
CLOSURE PERCLOSE PROSTYLE (VASCULAR PRODUCTS) IMPLANT
COVER SWIFTLINK CONNECTOR (BAG) ×1 IMPLANT
PACK EP LATEX FREE (CUSTOM PROCEDURE TRAY) ×1
PACK EP LF (CUSTOM PROCEDURE TRAY) ×1 IMPLANT
PAD DEFIB RADIO PHYSIO CONN (PAD) ×1 IMPLANT
PATCH CARTO3 (PAD) IMPLANT
SHEATH 9FR PRELUDE SNAP 13 (SHEATH) IMPLANT
SHEATH CARTO VIZIGO SM CVD (SHEATH) IMPLANT
SHEATH PINNACLE 7F 10CM (SHEATH) IMPLANT
SHEATH PINNACLE 8F 10CM (SHEATH) IMPLANT
SHEATH PROBE COVER 6X72 (BAG) IMPLANT
TUBING SMART ABLATE COOLFLOW (TUBING) IMPLANT

## 2022-06-27 NOTE — Discharge Instructions (Signed)

## 2022-06-27 NOTE — Anesthesia Procedure Notes (Signed)
Procedure Name: Intubation Date/Time: 06/27/2022 9:51 AM  Performed by: Lieutenant Diego, CRNAPre-anesthesia Checklist: Patient identified, Emergency Drugs available, Suction available and Patient being monitored Patient Re-evaluated:Patient Re-evaluated prior to induction Oxygen Delivery Method: Circle system utilized Preoxygenation: Pre-oxygenation with 100% oxygen Induction Type: IV induction Ventilation: Mask ventilation without difficulty Laryngoscope Size: Miller and 2 Grade View: Grade I Tube type: Oral Tube size: 7.5 mm Number of attempts: 1 Airway Equipment and Method: Stylet Placement Confirmation: ETT inserted through vocal cords under direct vision, positive ETCO2 and breath sounds checked- equal and bilateral Secured at: 23 cm Tube secured with: Tape Dental Injury: Teeth and Oropharynx as per pre-operative assessment

## 2022-06-27 NOTE — Anesthesia Postprocedure Evaluation (Signed)
Anesthesia Post Note  Patient: Douglas [name]  Procedure(s) Performed: ATRIAL FIBRILLATION ABLATION     Patient location during evaluation: PACU Anesthesia Type: General Level of consciousness: awake and alert, oriented and patient cooperative Pain management: pain level controlled Vital Signs Assessment: post-procedure vital signs reviewed and stable Respiratory status: spontaneous breathing, nonlabored ventilation and respiratory function stable Cardiovascular status: blood pressure returned to baseline and stable Postop Assessment: no apparent nausea or vomiting Anesthetic complications: no   There were no known notable events for this encounter.  Last Vitals:  Vitals:   06/27/22 1215 06/27/22 1220  BP: 121/69 115/69  Pulse: 67 65  Resp: 18 16  Temp:    SpO2: 92% 94%    Last Pain:  Vitals:   06/27/22 1207  TempSrc: Temporal  PainSc: 0-No pain                 Pervis Hocking

## 2022-06-27 NOTE — Transfer of Care (Signed)
Immediate Anesthesia Transfer of Care Note  Patient: Douglas [name]  Procedure(s) Performed: ATRIAL FIBRILLATION ABLATION  Patient Location: Cath Lab  Anesthesia Type:General  Level of Consciousness: awake  Airway & Oxygen Therapy: Patient Spontanous Breathing and Patient connected to nasal cannula oxygen  Post-op Assessment: Report given to RN and Post -op Vital signs reviewed and stable  Post vital signs: Reviewed and stable  Last Vitals:  Vitals Value Taken Time  BP    Temp    Pulse    Resp    SpO2      Last Pain:  Vitals:   06/27/22 0750  TempSrc:   PainSc: 0-No pain         Complications: There were no known notable events for this encounter.

## 2022-06-27 NOTE — Interval H&P Note (Signed)
History and Physical Interval Note:  06/27/2022 7:04 AM  Douglas [name]  has presented today for surgery, with the diagnosis of afib.  The various methods of treatment have been discussed with the patient and family. After consideration of risks, benefits and other options for treatment, the patient has consented to  Procedure(s): ATRIAL FIBRILLATION ABLATION (N/A) as a surgical intervention.  The patient's history has been reviewed, patient examined, no change in status, stable for surgery.  I have reviewed the patient's chart and labs.  Questions were answered to the patient's satisfaction.     [employer]infield [employer]

## 2022-06-27 NOTE — Anesthesia Preprocedure Evaluation (Addendum)
Anesthesia Evaluation  Patient identified by MRN, date of birth, ID band Patient awake    Reviewed: Allergy & Precautions, NPO status , Patient's Chart, lab work & pertinent test results, reviewed documented beta blocker date and time   Airway Mallampati: IV  TM Distance: >3 FB Neck ROM: Full    Dental  (+) Teeth Intact, Dental Advisory Given   Pulmonary sleep apnea and Continuous Positive Airway Pressure Ventilation , former smoker Quit smoking 1970, 5 pack year history    Pulmonary exam normal breath sounds clear to auscultation       Cardiovascular hypertension (132/101 preop, per pt normally lower), Pt. on medications and Pt. on home beta blockers + CAD and + Past MI  Normal cardiovascular exam+ dysrhythmias (eliquis- LD yesterday) Atrial Fibrillation + Valvular Problems/Murmurs (mild-mod MR) MR  Rhythm:Regular Rate:Normal  Echo 04/2022  1. Left ventricular ejection fraction, by estimation, is 55 to 60%. The  left ventricle has normal function. The left ventricle has no regional  wall motion abnormalities. Left ventricular diastolic parameters are  indeterminate.   2. Right ventricular systolic function is normal. The right ventricular  size is normal.   3. Left atrial size was moderately dilated.   4. The mitral valve is normal in structure. Mild to moderate mitral valve  regurgitation. No evidence of mitral stenosis.   5. Tricuspid valve regurgitation is mild to moderate.   6. The aortic valve is tricuspid. Aortic valve regurgitation is trivial.  No aortic stenosis is present.   7. Aneurysm of the ascending aorta, measuring 46 mm.   8. The inferior vena cava is normal in size with greater than 50%  respiratory variability, suggesting right atrial pressure of 3 mmHg.     Neuro/Psych negative neurological ROS  negative psych ROS   GI/Hepatic Neg liver ROS, hiatal hernia,GERD  Controlled and Medicated,,  Endo/Other   negative endocrine ROS    Renal/GU negative Renal ROS  negative genitourinary   Musculoskeletal  (+) Arthritis , Osteoarthritis,    Abdominal  (+) + obese  Peds  Hematology negative hematology ROS (+) Hb 16.7   Anesthesia Other Findings   Reproductive/Obstetrics negative OB ROS                              Anesthesia Physical Anesthesia Plan  ASA: 3  Anesthesia Plan: General   Post-op Pain Management: Tylenol PO (pre-op)*   Induction: Intravenous  PONV Risk Score and Plan: 2 and Ondansetron, Dexamethasone and Treatment may vary due to age or medical condition  Airway Management Planned: Oral ETT  Additional Equipment: None  Intra-op Plan:   Post-operative Plan: Extubation in OR  Informed Consent: I have reviewed the patients History and Physical, chart, labs and discussed the procedure including the risks, benefits and alternatives for the proposed anesthesia with the patient or authorized representative who has indicated his/her understanding and acceptance.     Dental advisory given  Plan Discussed with: CRNA  Anesthesia Plan Comments: (Last airway 2020: Ventilation: Mask ventilation without difficulty Laryngoscope Size: 2 and Miller Grade View: Grade I Tube type: Oral Tube size: 7.5 mm Number of attempts: 1 )         Anesthesia Quick Evaluation

## 2022-06-30 ENCOUNTER — Encounter (HOSPITAL_COMMUNITY): Payer: Self-pay | Admitting: Cardiology

## 2022-07-25 ENCOUNTER — Ambulatory Visit (HOSPITAL_COMMUNITY)
Admission: RE | Admit: 2022-07-25 | Discharge: 2022-07-25 | Disposition: A | Discharge status: Home / Self Care | Payer: Medicare Other | Source: Ambulatory Visit | Attending: Physician Assistant | Admitting: Physician Assistant

## 2022-07-25 VITALS — BP 128/84 | HR 57 | Ht 67.0 in | Wt 202.2 lb

## 2022-07-25 DIAGNOSIS — Z951 Presence of aortocoronary bypass graft: Secondary | ICD-10-CM | POA: Insufficient documentation

## 2022-07-25 DIAGNOSIS — I110 Hypertensive heart disease with heart failure: Secondary | ICD-10-CM | POA: Insufficient documentation

## 2022-07-25 DIAGNOSIS — I4819 Other persistent atrial fibrillation: Secondary | ICD-10-CM

## 2022-07-25 DIAGNOSIS — R002 Palpitations: Secondary | ICD-10-CM | POA: Diagnosis not present

## 2022-07-25 DIAGNOSIS — I5042 Chronic combined systolic (congestive) and diastolic (congestive) heart failure: Secondary | ICD-10-CM | POA: Diagnosis not present

## 2022-07-25 DIAGNOSIS — R001 Bradycardia, unspecified: Secondary | ICD-10-CM | POA: Diagnosis not present

## 2022-07-25 DIAGNOSIS — G4733 Obstructive sleep apnea (adult) (pediatric): Secondary | ICD-10-CM | POA: Diagnosis not present

## 2022-07-25 DIAGNOSIS — Z5181 Encounter for therapeutic drug level monitoring: Secondary | ICD-10-CM

## 2022-07-25 DIAGNOSIS — Z79899 Other long term (current) drug therapy: Secondary | ICD-10-CM | POA: Insufficient documentation

## 2022-07-25 DIAGNOSIS — D6869 Other thrombophilia: Secondary | ICD-10-CM | POA: Diagnosis not present

## 2022-07-25 DIAGNOSIS — I2510 Atherosclerotic heart disease of native coronary artery without angina pectoris: Secondary | ICD-10-CM | POA: Insufficient documentation

## 2022-07-25 DIAGNOSIS — Z7901 Long term (current) use of anticoagulants: Secondary | ICD-10-CM | POA: Insufficient documentation

## 2022-07-25 LAB — MAGNESIUM: Magnesium: 2.3 mg/dL (ref 1.7–2.4)
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
## 2022-07-29 ENCOUNTER — Telehealth (HOSPITAL_BASED_OUTPATIENT_CLINIC_OR_DEPARTMENT_OTHER): Payer: Self-pay

## 2022-07-29 MED ORDER — AMLODIPINE BESYLATE 5 MG PO TABS: 5.0000 mg | ORAL_TABLET | Freq: Every day | ORAL | 2 refills | Status: DC

## 2022-07-29 NOTE — Telephone Encounter (Signed)
Received fax from [facility] requesting refills for Amlodipine. Rx request sent to pharmacy.

## 2022-08-13 ENCOUNTER — Other Ambulatory Visit (HOSPITAL_COMMUNITY): Payer: Self-pay | Admitting: *Deleted

## 2022-08-13 MED ORDER — DOFETILIDE 500 MCG PO CAPS: 500.0000 ug | ORAL_CAPSULE | Freq: Two times a day (BID) | ORAL | 1 refills | Status: DC

## 2022-09-06 NOTE — Progress Notes (Unsigned)
HPI male former smoker followed for OSA, complicated by CAD/stent, HBP, GERD NPSG 01/18/13 AHI  22/hr, moderate OSA, weight  195 lbs  ---------------------------------------------------------------------------.   09/05/21- 77 year old male former smoker followed for OSA, complicated by CAD/stent, PAFib, HBP, GERD/ large HH,  CPAP auto 5-15/ [employer] Download -compliance 100%, AHI 1/ hr Body weight today 205 lbs Covid vax-3 Phizer Flu vax- Download reviewed.  He is comfortable with his CPAP and satisfied that life is better with it.  No specific concerns.  Says he is breathing well.  Paces himself.  Cardiac status unchanged with no acute events as he continues Tikosyn.  09/08/22- 77 year old male former smoker followed for OSA, complicated by CAD/stent, PAFib, HBP, GERD/ large HH,  CPAP auto 5-15/ [employer] Download -compliance  Body weight today   CT a chest/aorta-11/08/21 IMPRESSION: 1. Ascending thoracic aortic aneurysm measuring 4.5 cm. Recommend semi-annual imaging followup by CTA or MRA and referral to cardiothoracic surgery if not already obtained. This recommendation follows 2010 ACCF/AHA/AATS/ACR/ASA/SCA/SCAI/SIR/STS/SVM Guidelines for the Diagnosis and Management of Patients With Thoracic Aortic Disease. Circulation. 2010; 121: Z610-R604. Aortic aneurysm NOS (ICD10-I71.9) 2. Mild bronchial wall thickening and air trapping. 3. Cholelithiasis without cholecystitis. 4. Moderate hiatal hernia, decreased from 2020. 5.  Aortic Atherosclerosis (ICD10-I70.0).  ROS-see HPI   + = positive Constitutional:   No-   weight loss, night sweats, fevers, chills, fatigue, lassitude. HEENT:   No-  headaches, difficulty swallowing, tooth/dental problems, sore throat,       No-  sneezing, itching, ear ache, nasal congestion, post nasal drip,  CV:  No-   chest pain, orthopnea, PND, swelling in lower extremities, anasarca, dizziness, palpitations Resp: No-   shortness of breath with exertion or at rest.               No-   productive cough,  No non-productive cough,  No- coughing up of blood.              No-   change in color of mucus.  No- wheezing.   Skin: No-   rash or lesions. GI:  No-   heartburn, indigestion, abdominal pain, nausea, vomiting,  GU:  MS:  No-   joint pain or swelling.   Neuro-     nothing unusual Psych:  No- change in mood or affect. No depression or anxiety.  No memory loss.  OBJ- Physical Exam   stable baseline exam General- Alert, Oriented, Affect-appropriate, Distress- none acute. +Overweight Skin- rash-none, lesions- none, excoriation- none Lymphadenopathy- none Head- atraumatic            Eyes- Gross vision intact, PERRLA, conjunctivae and secretions clear            Ears- Hearing, canals-normal            Nose- Clear, no-Septal dev, mucus, polyps, erosion, perforation             Throat- Mallampati II-III , mucosa clear , drainage- none, tonsils- atrophic Neck- flexible , trachea midline, no stridor , thyroid nl, carotid no bruit Chest - symmetrical excursion , unlabored           Heart/CV- RRR  with etxras , no murmur , no gallop  , no rub, nl s1 s2                            - JVD- none , edema- none, stasis changes- none, varices- none  Lung- clear to P&A, wheeze- none, cough- none , dullness-none, rub- none           Chest wall-  Abd-  Br/ Gen/ Rectal- Not done, not indicated Extrem- cyanosis- none, clubbing, none, atrophy- none, strength- nl Neuro- grossly intact to observation

## 2022-09-08 ENCOUNTER — Encounter: Payer: Self-pay | Admitting: Internal Medicine

## 2022-09-08 ENCOUNTER — Ambulatory Visit: Payer: Medicare Other | Admitting: Internal Medicine

## 2022-09-08 VITALS — BP 110/70 | HR 57 | Temp 97.9°F | Ht 67.0 in | Wt 199.6 lb

## 2022-09-08 DIAGNOSIS — E1159 Type 2 diabetes mellitus with other circulatory complications: Secondary | ICD-10-CM

## 2022-09-08 DIAGNOSIS — G4733 Obstructive sleep apnea (adult) (pediatric): Secondary | ICD-10-CM | POA: Diagnosis not present

## 2022-09-08 DIAGNOSIS — I259 Chronic ischemic heart disease, unspecified: Secondary | ICD-10-CM

## 2022-09-08 DIAGNOSIS — E119 Type 2 diabetes mellitus without complications: Secondary | ICD-10-CM | POA: Insufficient documentation

## 2022-09-08 NOTE — Assessment & Plan Note (Signed)
Benefits from CPAP with good compliance and control Plan- continue auto 5-15 

## 2022-09-08 NOTE — Assessment & Plan Note (Signed)
Continues management by cardiology 

## 2022-09-08 NOTE — Assessment & Plan Note (Signed)
[facility] 

## 2022-09-08 NOTE — Patient Instructions (Signed)
We can continue CPAP auto 5-15 ° °Please call if we can help °

## 2022-09-10 ENCOUNTER — Encounter: Payer: Self-pay | Admitting: Skilled Nursing Facility1

## 2022-09-10 ENCOUNTER — Encounter: Payer: Medicare Other | Attending: Family Medicine | Admitting: Skilled Nursing Facility1

## 2022-09-10 DIAGNOSIS — E1159 Type 2 diabetes mellitus with other circulatory complications: Secondary | ICD-10-CM | POA: Diagnosis present

## 2022-09-10 NOTE — Progress Notes (Signed)
DM: Jardiance   A1C 6.7  Other Dx: Anemia Arthritis Blood transfusion Cancer GERD Hyperlipidemia  MI   Pt states he owns 5 hours and keeps a sizable garden. Pt arrives with his supportive wife.  Diabetes Self-Management Education  Visit Type: First/Initial   09/10/2022  [name], identified by name and date of birth, is a 77 y.o. male with a diagnosis of Diabetes: Type 2.   ASSESSMENT  There were no vitals taken for this visit. There is no height or weight on file to calculate BMI.   Diabetes Self-Management Education - 09/10/22 0844       Visit Information   Visit Type First/Initial      Initial Visit   Diabetes Type Type 2    Are you currently following a meal plan? No    Are you taking your medications as prescribed? Yes      Health Coping   How would you rate your overall health? Good      Psychosocial Assessment   Patient Belief/Attitude about Diabetes Motivated to manage diabetes    What is the hardest part about your diabetes right now, causing you the most concern, or is the most worrisome to you about your diabetes?   Making healty food and beverage choices;Checking blood sugar    Self-care barriers None    Self-management support Family    Other persons present Spouse/SO    Patient Concerns Nutrition/Meal planning    Special Needs None    Preferred Learning Style Auditory    Learning Readiness Ready    How often do you need to have someone help you when you read instructions, pamphlets, or other written materials from your doctor or pharmacy? 1 - Never      Pre-Education Assessment   Patient understands the diabetes disease and treatment process. Needs Instruction    Patient understands incorporating nutritional management into lifestyle. Needs Instruction    Patient undertands incorporating physical activity into lifestyle. Needs Instruction    Patient understands using medications safely. Needs Instruction    Patient understands  monitoring blood glucose, interpreting and using results Needs Instruction    Patient understands prevention, detection, and treatment of acute complications. Needs Instruction    Patient understands prevention, detection, and treatment of chronic complications. Needs Instruction    Patient understands how to develop strategies to address psychosocial issues. Needs Instruction    Patient understands how to develop strategies to promote health/change behavior. Needs Instruction      Complications   Last HgB A1C per patient/outside source 6.7 %    How often do you check your blood sugar? 3-4 times / week    Number of hypoglycemic episodes per month 0    Number of hyperglycemic episodes ( >200mg /dL): Never    Have you had a dilated eye exam in the past 12 months? Yes    Have you had a dental exam in the past 12 months? Yes    Are you checking your feet? No      Dietary Intake   Breakfast almonds + prunes + egg and cheese mcmuffin    Lunch [place] snadwhich or banana sandwich mayo and penaut butter + chips or fritos and peanuts or hot dog    Snack (afternoon) peanut m and ms    Dinner steak and cheese sub or chicken + butter beans or corn or poatatoes or broccoli or cole slaw    Snack (evening) popcorn and peanut butter crackres    Beverage(s) low  sodium v8, water, unsweet tea, diet soda      Activity / Exercise   Activity / Exercise Type Light (walking / raking leaves);Moderate (swimming / aerobic walking)    How many days per week do you exercise? 3    How many minutes per day do you exercise? 60    Total minutes per week of exercise 180      Patient Education   Previous Diabetes Education No    Disease Pathophysiology Factors that contribute to the development of diabetes    Healthy Eating Plate Method;Food label reading, portion sizes and measuring food.;Role of diet in the treatment of diabetes and the relationship between the three main macronutrients and blood glucose level;Meal  options for control of blood glucose level and chronic complications.    Being Active Role of exercise on diabetes management, blood pressure control and cardiac health.;Helped patient identify appropriate exercises in relation to his/her diabetes, diabetes complications and other health issue.    Medications Reviewed patients medication for diabetes, action, purpose, timing of dose and side effects.;Reviewed medication adjustment guidelines for hyperglycemia and sick days.    Monitoring Taught/evaluated SMBG meter.;Purpose and frequency of SMBG.;Daily foot exams;Yearly dilated eye exam;Identified appropriate SMBG and/or A1C goals.;Interpreting lab values - A1C, lipid, urine microalbumina.    Acute complications Taught prevention, symptoms, and  treatment of hypoglycemia - the 15 rule.;Discussed and identified patients' prevention, symptoms, and treatment of hyperglycemia.    Chronic complications Dental care;Retinopathy and reason for yearly dilated eye exams;Reviewed with patient heart disease, higher risk of, and prevention;Nephropathy, what it is, prevention of, the use of ACE, ARB's and early detection of through urine microalbumia.;Relationship between chronic complications and blood glucose control    Diabetes Stress and Support Role of stress on diabetes      Individualized Goals (developed by patient)   Nutrition General guidelines for healthy choices and portions discussed;Follow meal plan discussed    Physical Activity Exercise 5-7 days per week;60 minutes per day    Medications take my medication as prescribed    Monitoring  Test my blood glucose as discussed;Test blood glucose pre and post meals as discussed    Problem Solving Sleep Pattern;Eating Pattern    Reducing Risk do foot checks daily      Post-Education Assessment   Patient understands the diabetes disease and treatment process. Demonstrates understanding / competency    Patient understands incorporating nutritional management  into lifestyle. Demonstrates understanding / competency    Patient undertands incorporating physical activity into lifestyle. Demonstrates understanding / competency    Patient understands using medications safely. Demonstrates understanding / competency    Patient understands monitoring blood glucose, interpreting and using results Demonstrates understanding / competency    Patient understands prevention, detection, and treatment of acute complications. Demonstrates understanding / competency    Patient understands prevention, detection, and treatment of chronic complications. Demonstrates understanding / competency    Patient understands how to develop strategies to address psychosocial issues. Demonstrates understanding / competency    Patient understands how to develop strategies to promote health/change behavior. Demonstrates understanding / competency      Outcomes   Expected Outcomes Demonstrated interest in learning. Expect positive outcomes    Future DMSE PRN    Program Status Completed             Individualized Plan for Diabetes Self-Management Training:   Learning Objective:  Patient will have a greater understanding of diabetes self-management. Patient education plan is to attend individual and/or group  sessions per assessed needs and concerns.    Expected Outcomes:  Demonstrated interest in learning. Expect positive outcomes  Education material provided: ADA - How to Thrive: A Guide for Your Journey with Diabetes, Food label handouts, Meal plan card, My Plate, and Snack sheet  If problems or questions, patient to contact team via:  Phone and Email  Future DSME appointment: PRN

## 2022-09-25 ENCOUNTER — Ambulatory Visit: Payer: Medicare Other | Attending: Cardiology | Admitting: Cardiology

## 2022-09-25 ENCOUNTER — Encounter: Payer: Self-pay | Admitting: Cardiology

## 2022-09-25 VITALS — BP 140/80 | HR 58 | Ht 67.0 in | Wt 195.0 lb

## 2022-09-25 DIAGNOSIS — D6869 Other thrombophilia: Secondary | ICD-10-CM

## 2022-09-25 DIAGNOSIS — I4819 Other persistent atrial fibrillation: Secondary | ICD-10-CM

## 2022-09-25 DIAGNOSIS — Z79899 Other long term (current) drug therapy: Secondary | ICD-10-CM

## 2022-09-25 DIAGNOSIS — I483 Typical atrial flutter: Secondary | ICD-10-CM

## 2022-09-25 DIAGNOSIS — G4733 Obstructive sleep apnea (adult) (pediatric): Secondary | ICD-10-CM

## 2022-09-25 NOTE — Progress Notes (Signed)
Electrophysiology Office Note   Date:  09/25/2022   ID:  Douglas [name] [birth year], MRN [id number]  PCP:  Kaleen Mask, MD  Cardiologist:  Duke Salvia Primary Electrophysiologist:  Waddell Iten Jorja Loa, MD    Chief Complaint: AF   History of Present Illness: Douglas [name] is a 77 y.o. male who is being seen today for the evaluation of AF at the request of Kaleen Mask, *. Presenting today for electrophysiology evaluation.  He has a history significant for diastolic heart failure, ascending aortic aneurysm, coronary artery disease post LAD stent and CABG, hypertension, OSA on CPAP, atrial fibrillation/flutter.  He is currently on dofetilide.  He is now status post ablation 06/28/2022.  Today, denies symptoms of palpitations, chest pain, shortness of breath, orthopnea, PND, lower extremity edema, claudication, dizziness, presyncope, syncope, bleeding, or neurologic sequela. The patient is tolerating medications without difficulties.  Since ablation he has done well.  He has had no chest pain or shortness of breath.  At first, he was having frequent episodes of atrial fibrillation, but these have reduced over time.  He is only having minimal episodes over the last few weeks.    Past Medical History:  Diagnosis Date   Acute on chronic diastolic heart failure (HCC) 01/11/2020   Anemia    Arthritis    "joints pains; comes w/age" (05/09/2014)   Ascending aortic aneurysm (HCC) 10/31/2021   Chronic combined systolic and diastolic heart failure (HCC) 01/11/2020   Chronic combined systolic and diastolic heart failure (HCC) 01/11/2020   Coronary artery disease    Essential hypertension 01/11/2020   GERD (gastroesophageal reflux disease)    H/O hiatal hernia 1980   Heart attack (HCC) 10/29/05   anteroseptal myocardial infaction Taxus stent to LAD   History of blood transfusion 1980?   "while hospitalized w/kidney stones, tore my hiatal hernia"   History of gout     History of kidney stones    Hx of CABG 02/14/2021   Hyperlipidemia    Hypertension    Ischemic heart disease    Lower extremity edema 08/23/2015   OSA on CPAP    Pneumonia    S/P coronary artery stent placement 02/14/2021   Skin cancer    "burnt most of them off; face, arms"   Past Surgical History:  Procedure Laterality Date   ATRIAL FIBRILLATION ABLATION N/A 06/27/2022   Procedure: ATRIAL FIBRILLATION ABLATION;  Surgeon: Regan Lemming, MD;  Location: [facility] INVASIVE CV LAB;  Service: Cardiovascular;  Laterality: N/A;   CARDIOVASCULAR STRESS TEST Left 05/28/2009   EF 57% no reversible ischemia   CARDIOVERSION N/A 07/12/2020   Procedure: CARDIOVERSION;  Surgeon: Little Ishikawa, MD;  Location: [facility] ENDOSCOPY;  Service: Cardiovascular;  Laterality: N/A;   CATARACT EXTRACTION W/ INTRAOCULAR LENS IMPLANT Left 1997   CORONARY ANGIOPLASTY WITH STENT PLACEMENT Bilateral 2007   "1"   CORONARY STENT INTERVENTION N/A 05/11/2018   Procedure: CORONARY STENT INTERVENTION;  Surgeon: Marykay Lex, MD;  Location: [facility] INVASIVE CV LAB;  Service: Cardiovascular;  Laterality: N/A;   DOPPLER ECHOCARDIOGRAPHY  01/06/2007   ESOPHAGEAL MANOMETRY N/A 01/05/2019   Procedure: ESOPHAGEAL MANOMETRY (EM);  Surgeon: Carman Ching, MD;  Location: [facility] ENDOSCOPY;  Service: Endoscopy;  Laterality: N/A;   EYE SURGERY Left 1978   "injury"   HERNIA REPAIR     INGUINAL HERNIA REPAIR Bilateral 05/09/2014   INGUINAL HERNIA REPAIR Bilateral 05/09/2014   Procedure: LAPAROSCOPIC BILATERAL INGUINAL HERNIA REPAIR;  Surgeon: Karie Soda, MD;  Location:  [facility] OR;  Service: General;  Laterality: Bilateral;   INSERTION OF MESH N/A 05/09/2014   Procedure: INSERTION OF MESH;  Surgeon: Karie Soda, MD;  Location: [facility] OR;  Service: General;  Laterality: N/A;  INSERTION OF MESH TO ABDOMEN AND BILATERAL GROIN   LEFT HEART CATH AND CORONARY ANGIOGRAPHY N/A 05/11/2018   Procedure: LEFT HEART CATH AND CORONARY ANGIOGRAPHY;  Surgeon:  Marykay Lex, MD;  Location: [facility] INVASIVE CV LAB;  Service: Cardiovascular;  Laterality: N/A;   SHOULDER ARTHROSCOPY W/ ROTATOR CUFF REPAIR Right 06/2010   SKIN CANCER EXCISION Left    "arm"   TEE WITHOUT CARDIOVERSION N/A 07/12/2020   Procedure: TRANSESOPHAGEAL ECHOCARDIOGRAM (TEE);  Surgeon: Little Ishikawa, MD;  Location: [facility] ENDOSCOPY;  Service: Cardiovascular;  Laterality: N/A;   TONSILLECTOMY  1964   UMBILICAL HERNIA REPAIR  05/09/2014   UMBILICAL HERNIA REPAIR N/A 05/09/2014   Procedure: HERNIA REPAIR UMBILICAL ADULT;  Surgeon: Karie Soda, MD;  Location: [facility] OR;  Service: General;  Laterality: N/A;     Current Outpatient Medications  Medication Sig Dispense Refill   acetaminophen (TYLENOL) 500 MG tablet Take 500-1,000 mg by mouth every 6 (six) hours as needed (for pain.).     amLODipine (NORVASC) 5 MG tablet Take 1 tablet (5 mg total) by mouth daily. 90 tablet 2   ampicillin (PRINCIPEN) 500 MG capsule Take 500 mg by mouth daily.     apixaban (ELIQUIS) 5 MG TABS tablet Take 1 tablet (5 mg total) by mouth 2 (two) times daily. 60 tablet 5   carvedilol (COREG) 6.25 MG tablet TAKE 1 TABLET BY MOUTH TWICE DAILY WITH A MEAL 180 tablet 3   Cholecalciferol (VITAMIN D) 2000 UNITS tablet Take 2,000 Units by mouth daily.     colchicine 0.6 MG tablet Take 0.6 mg by mouth 2 (two) times daily as needed (gout). Colcrys     dofetilide (TIKOSYN) 500 MCG capsule Take 1 capsule (500 mcg total) by mouth 2 (two) times daily. 180 capsule 1   empagliflozin (JARDIANCE) 10 MG TABS tablet Take 1 tablet (10 mg total) by mouth daily before breakfast. 90 tablet 1   EPINEPHrine 0.3 mg/0.3 mL IJ SOAJ injection Inject 0.3 mg into the muscle as needed for allergies.     fexofenadine (ALLEGRA) 180 MG tablet Take 180 mg by mouth daily as needed for allergies.     furosemide (LASIX) 40 MG tablet TAKE 1 TABLET BY MOUTH DAILY AS NEEDED FOR FLUID OR EDEMA 90 tablet 3   metroNIDAZOLE (METROGEL) 0.75 % gel Apply 1  Application topically daily.     nitroGLYCERIN (NITROSTAT) 0.4 MG SL tablet Place 0.4 mg under the tongue every 5 (five) minutes as needed for chest pain.     omeprazole (PRILOSEC) 20 MG capsule Take 20 mg by mouth daily.     Polyethyl Glycol-Propyl Glycol (SYSTANE ULTRA) 0.4-0.3 % SOLN Place 1 drop into both eyes daily as needed (Dry eyes).     simvastatin (ZOCOR) 20 MG tablet TAKE 1 TABLET BY MOUTH EVERYDAY AT BEDTIME 30 tablet 2   testosterone cypionate (DEPOTESTOSTERONE CYPIONATE) 200 MG/ML injection SMARTSIG:0.5-2 Milliliter(s) IM Every 2 Weeks PRN     valACYclovir (VALTREX) 1000 MG tablet Take 1,000 mg by mouth 2 (two) times daily as needed (fever blisters).     No current facility-administered medications for this visit.    Allergies:   Ace inhibitors, Bee venom, Lisinopril, Losartan, and Other   Social History:  The patient  reports that he quit smoking about  54 years ago. His smoking use included cigarettes. He has a 5.00 pack-year smoking history. He quit smokeless tobacco use about 44 years ago.  His smokeless tobacco use included chew. He reports that he does not currently use alcohol. He reports that he does not use drugs.   Family History:  The patient's family history includes Diabetes in his father and mother; Heart disease in his father and mother.   ROS:  Please see the history of present illness.   Otherwise, review of systems is positive for none.   All other systems are reviewed and negative.   PHYSICAL EXAM: VS:  BP (!) 140/80   Pulse (!) 58   Ht 5\' 7"  (1.702 m)   Wt 195 lb (88.5 kg)   SpO2 97%   BMI 30.54 kg/m  , BMI Body mass index is 30.54 kg/m. GEN: Well nourished, well developed, in no acute distress  HEENT: normal  Neck: no JVD, carotid bruits, or masses Cardiac: RRR; no murmurs, rubs, or gallops,no edema  Respiratory:  clear to auscultation bilaterally, normal work of breathing GI: soft, nontender, nondistended, + BS MS: no deformity or atrophy  Skin:  warm and dry Neuro:  Strength and sensation are intact Psych: euthymic mood, full affect  EKG:  EKG is ordered today. Personal review of the ekg ordered shows sinus rhythm    Recent Labs: 06/13/2022: BUN 28; Creatinine, Ser 1.21; Hemoglobin 16.7; Platelets 179; Potassium 4.7; Sodium 136 07/25/2022: Magnesium 2.3    Lipid Panel     Component Value Date/Time   CHOL 114 02/14/2021 1025   TRIG 96 02/14/2021 1025   HDL 30 (L) 02/14/2021 1025   CHOLHDL 3.8 02/14/2021 1025   CHOLHDL 3.7 04/19/2015 0801   VLDL 17 04/19/2015 0801   LDLCALC 66 02/14/2021 1025     Wt Readings from Last 3 Encounters:  09/25/22 195 lb (88.5 kg)  09/08/22 199 lb 9.6 oz (90.5 kg)  07/25/22 202 lb 3.2 oz (91.7 kg)      Other studies Reviewed: Additional studies/ records that were reviewed today include: TEE 07/12/20  Review of the above records today demonstrates:   1. Left ventricular ejection fraction, by estimation, is 45 to 50%. The  left ventricle has mildly decreased function. The left ventricle  demonstrates regional wall motion abnormalities. Apical hypokinesis.   2. Right ventricular systolic function is normal. The right ventricular  size is mildly enlarged.   3. The mitral valve is normal in structure. Trivial mitral valve  regurgitation.   4. The aortic valve is tricuspid. Aortic valve regurgitation is not  visualized.   5. Spontaneous echo contrast present, but no left atrial/left atrial  appendage thrombus was detected. Definity was administered with showed  filling of the appendage.   Left heart cath 05/11/2018 Prox RCA lesion is 75% stenosed. A drug-eluting stent was successfully placed using a STENT ORSIRO 3.0X18. -Postdilated to 3.6 mm Post intervention, there is a 0% residual stenosis. Mid RCA lesion is 20% stenosed. Dist RCA lesion is 20% stenosed with 20% stenosed side branch in Ost RPDA. There is mild left ventricular systolic dysfunction. The left ventricular ejection fraction  is 45-50% by visual estimate. LV end diastolic pressure is moderately elevated. Prox LAD to Mid LAD long stented segment is 10% stenosed.   ASSESSMENT AND PLAN:  1.  Persistent atrial fibrillation/flutter: Currently on dofetilide and Eliquis.  CHA2DS2-VASc of 5.  He remains in sinus rhythm with only minimal episodes of atrial fibrillation.  Post ablation 06/27/2022.  Ellen Mayol continue with current management.  2.  Secondary hypercoagulable state: Currently on Eliquis for atrial fibrillation  3.  Chronic systolic heart failure: Currently on medical therapy per primary cardiology  4.  Coronary artery disease: Post CABG.  No current chest pain.  5.  Obstructive sleep apnea: CPAP compliance encouraged  6.  High risk medication monitoring: Currently on dofetilide.  Recent labs within normal limits.   Current medicines are reviewed at length with the patient today.   The patient does not have concerns regarding his medicines.  The following changes were made today:  none  Labs/ tests ordered today include:  Orders Placed This Encounter  Procedures   EKG 12-Lead     Disposition:   FU 6 months  Signed, Laneice Meneely Jorja Loa, MD  09/25/2022 3:43 PM     [facility] [address] [place] [address] [phone] (office) [phone] (fax)'

## 2022-09-25 NOTE — Patient Instructions (Signed)
Medication Instructions:  Your physician recommends that you continue on your current medications as directed. Please refer to the Current Medication list given to you today. *If you need a refill on your cardiac medications before your next appointment, please call your pharmacy*   Follow-Up: At [facility], you and your health needs are our priority.  As part of our continuing mission to provide you with exceptional heart care, we have created designated Provider Care Teams.  These Care Teams include your primary Cardiologist (physician) and Advanced Practice Providers (APPs -  Physician Assistants and Nurse Practitioners) who all work together to provide you with the care you need, when you need it.  We recommend signing up for the patient portal called "MyChart".  Sign up information is provided on this After Visit Summary.  MyChart is used to connect with patients for Virtual Visits (Telemedicine).  Patients are able to view lab/test results, encounter notes, upcoming appointments, etc.  Non-urgent messages can be sent to your provider as well.   To learn more about what you can do with MyChart, go to [email or web address].    Your next appointment:   6 month(s)  Provider:   You will see one of the following Advanced Practice Providers on your designated Care Team:   Renee Ursuy, PA-C Michael "Andy" Tillery, PA-C 

## 2022-10-31 ENCOUNTER — Telehealth (HOSPITAL_BASED_OUTPATIENT_CLINIC_OR_DEPARTMENT_OTHER): Payer: Self-pay | Admitting: Family

## 2022-10-31 NOTE — Telephone Encounter (Signed)
Good morning.  This patient is in my workqueue for a follow up CTA chest / aorta--He has a Cardiac CTA in February of this year.  Does he still need to have this testing done?

## 2022-10-31 NOTE — Telephone Encounter (Signed)
He needs one 11/2022. His aorta size is recommended for monitoring every 6 months. Thank you for checking!  Alver Sorrow, NP

## 2022-11-03 ENCOUNTER — Other Ambulatory Visit (HOSPITAL_BASED_OUTPATIENT_CLINIC_OR_DEPARTMENT_OTHER): Payer: Self-pay | Admitting: Cardiovascular Disease

## 2022-11-03 NOTE — Telephone Encounter (Signed)
Rx(s) sent to pharmacy electronically.  

## 2022-12-23 ENCOUNTER — Encounter (HOSPITAL_BASED_OUTPATIENT_CLINIC_OR_DEPARTMENT_OTHER): Payer: Self-pay | Admitting: Cardiovascular Disease

## 2022-12-23 ENCOUNTER — Ambulatory Visit (INDEPENDENT_AMBULATORY_CARE_PROVIDER_SITE_OTHER): Payer: Medicare Other | Admitting: Cardiovascular Disease

## 2022-12-23 VITALS — BP 148/86 | HR 53 | Ht 67.0 in | Wt 197.4 lb

## 2022-12-23 DIAGNOSIS — I10 Essential (primary) hypertension: Secondary | ICD-10-CM

## 2022-12-23 DIAGNOSIS — I5042 Chronic combined systolic (congestive) and diastolic (congestive) heart failure: Secondary | ICD-10-CM

## 2022-12-23 DIAGNOSIS — Z5181 Encounter for therapeutic drug level monitoring: Secondary | ICD-10-CM | POA: Diagnosis not present

## 2022-12-23 DIAGNOSIS — I480 Paroxysmal atrial fibrillation: Secondary | ICD-10-CM

## 2022-12-23 DIAGNOSIS — E669 Obesity, unspecified: Secondary | ICD-10-CM | POA: Insufficient documentation

## 2022-12-23 DIAGNOSIS — E1169 Type 2 diabetes mellitus with other specified complication: Secondary | ICD-10-CM | POA: Diagnosis not present

## 2022-12-23 DIAGNOSIS — E785 Hyperlipidemia, unspecified: Secondary | ICD-10-CM

## 2022-12-23 HISTORY — DX: Obesity, unspecified: E11.69

## 2022-12-23 MED ORDER — FUROSEMIDE 40 MG PO TABS: 40.0000 mg | ORAL_TABLET | Freq: Every day | ORAL | 3 refills | Status: AC | PRN

## 2022-12-23 MED ORDER — ROSUVASTATIN CALCIUM 20 MG PO TABS: 20.0000 mg | ORAL_TABLET | Freq: Every day | ORAL | 3 refills | Status: DC

## 2022-12-23 MED ORDER — VALSARTAN 160 MG PO TABS: 160.0000 mg | ORAL_TABLET | Freq: Every day | ORAL | 3 refills | Status: DC

## 2022-12-23 NOTE — Patient Instructions (Addendum)
Medication Instructions:  STOP AMLODIPINE   START VALSARTAN 160 MG DAILY   STOP SIMVASTATIN   START ROSUVASTATIN 20 MG DAILY   *If you need a refill on your cardiac medications before your next appointment, please call your pharmacy*  Lab Work: BMET IN 1 WEEK   LP/CMET IN 3 MONTHS   If you have labs (blood work) drawn today and your tests are completely normal, you will receive your results only by: MyChart Message (if you have MyChart) OR A paper copy in the mail If you have any lab test that is abnormal or we need to change your treatment, we will call you to review the results.  Testing/Procedures: Your physician has requested that you have an echocardiogram. Echocardiography is a painless test that uses sound waves to create images of your heart. It provides your doctor with information about the size and shape of your heart and how well your heart's chambers and valves are working. This procedure takes approximately one hour. There are no restrictions for this procedure. Please do NOT wear cologne, perfume, aftershave, or lotions (deodorant is allowed). Please arrive 15 minutes prior to your appointment time.  [place]   Follow-Up: At [facility], you and your health needs are our priority.  As part of our continuing mission to provide you with exceptional heart care, we have created designated Provider Care Teams.  These Care Teams include your primary Cardiologist (physician) and Advanced Practice Providers (APPs -  Physician Assistants and Nurse Practitioners) who all work together to provide you with the care you need, when you need it.  We recommend signing up for the patient portal called "MyChart".  Sign up information is provided on this After Visit Summary.  MyChart is used to connect with patients for Virtual Visits (Telemedicine).  Patients are able to view lab/test results, encounter notes, upcoming appointments, etc.  Non-urgent messages can be sent to  your provider as well.   To learn more about what you can do with MyChart, go to [email or web address].    Your next appointment:   ABOUT 1 WEEK AFTER ECHO IN FEBRUARY   Provider:   Chilton Si, MD or Gillian Shields, NP    PHARM D IN 1 MONTH

## 2022-12-23 NOTE — Progress Notes (Signed)
Cardiology Office Note:  .    Date:  12/23/2022  ID:  Douglas [name], DOB [birth year], MRN [id number] PCP: Kaleen Mask, MD  [facility] Providers Cardiologist:  Chilton Si, MD Electrophysiologist:  Will Jorja Loa, MD     History of Present Illness: .    Douglas [name] is a 77 y.o. male with CAD s/p MI and PCI (LAD 2007, RCA 2020), PAF, chronic systolic and diastolic heart failure, hypertension, hyperlipidemia, chronic occult GI bleed, and OSA on CPAP who presents for follow up.  Douglas [name] was previously a patient of Dr. Patty Sermons.  He had an anterior MI 10/2005.  He had nuclear stress tests 05/2009 and 10/2012 that were negative for ischemia. At previous appointments carvedilol was reduced due to bradycardia.     Douglas [name] reported dizziness and his heart rate was noted to be low.  He was referred for an event monitor that showed 5 beats of NSVT.  He was referred for an echo 05/04/2018 that revealed LVEF 45 to 50% with hypokinesis of the apical myocardium.  He also had grade 1 diastolic dysfunction.  He then underwent left heart catheterization 04/2018 that revealed a 75% proximal RCA lesion.  A drug-eluting stent was placed.  His prior LAD stent was patent.  His hiatal hernia was repaired 03/2019.  Following that, his blood pressure was running low.  This was thought to be due to his 30 pound weight loss after hiatal hernia surgery.  However he was asymptomatic and did not want to lower his antihypertensives. His amlodipine was subsequently reduced due to hypotension.    He developed atrial fibrillation 06/2020 he was found to be in new onset atrial fibrillation. He was started on Eliquis. He had a TEE cardioversion at which time his LVEF was 45-50% with apical hypokinesis. He followed up in atrial fibrillation clinic 07/2020 due to recurrent atrial fibrillation. He was started on Tikosyn 08/2020. He converted to sinus rhythm and did not require cardioversion.     He saw A. Fib clinic 07/2021 and was doing well. At his visit 10/2021, he reported home blood pressures averaging 130's systolic. We added Jardiance 10 mg daily. He had a chest CTA 10/2021 showing ascending thoracic aortic aneurysm measuring 4.5 cm, slightly increased from prior. He was seen by Dr. Elberta Fortis 01/2022 due to breakthrough episodes of Afib and atrial flutter. Subsequently underwent ablation 06/27/2022. Initially continued to have arrhythmic episodes post ablation but frequency seemed to be trending down. He followed up with Dr. Elberta Fortis 09/2022 and was having only minimal episodes. He had an echo 04/2022 revealing LVEF 55-60%, mild to moderate mitral and tricuspid regurgitation. Ascending aortic aneurysm measured 46 mm.  Today, he is accompanied by a family member. He reports that his blood pressures have been labile. Per his personal log, home readings have ranged from the 110s systolic (rare) to the 150s, averaging in the 130s. His blood pressure this morning was 125/72, and currently in the office it is 148/80 (148/86 on manual recheck). Diastolic numbers have been consistently stable with a few readings in the 50's. For activity he continues to complete his yard work, generally feels well during without anginal symptoms. However, he continues to have intermittent arrhythmic episodes. If he goes into Afib he will stop to rest for a short time. Typically he seems to revert to NSR within 30 minutes. He has noticed that after eating larger meals, he may subsequently develop an episode of Afib. He believes that this may  be a possible trigger. Lately he denies any issues with LE edema. He denies any chest pain, shortness of breath, lightheadedness, headaches, syncope, orthopnea, or PND.  ROS:  Please see the history of present illness. All other systems are reviewed and negative.   Studies Reviewed: .        Risk Assessment/Calculations:    CHA2DS2-VASc Score = 5   This indicates a 7.2% annual risk of  stroke. The patient's score is based upon: CHF History: 1 HTN History: 1 Diabetes History: 0 Stroke History: 0 Vascular Disease History: 1 Age Score: 2 Gender Score: 0    HYPERTENSION CONTROL Vitals:   12/23/22 0807 12/23/22 0824  BP: (!) 148/80 (!) 148/86    The patient's blood pressure is elevated above target today.  In order to address the patient's elevated BP: A new medication was prescribed today.          Physical Exam:    VS:  BP (!) 148/86 (BP Location: Right Arm, Patient Position: Sitting, Cuff Size: Large)   Pulse (!) 53   Ht 5\' 7"  (1.702 m)   Wt 197 lb 6.4 oz (89.5 kg)   BMI 30.92 kg/m  , BMI Body mass index is 30.92 kg/m. GENERAL:  Well appearing HEENT: Pupils equal round and reactive, fundi not visualized, oral mucosa unremarkable NECK:  No jugular venous distention, waveform within normal limits, carotid upstroke brisk and symmetric, no bruits, no thyromegaly LUNGS:  Clear to auscultation bilaterally HEART:  RRR.  PMI not displaced or sustained,S1 and S2 within normal limits, no S3, no S4, no clicks, no rubs, no murmurs ABD:  Flat, positive bowel sounds normal in frequency in pitch, no bruits, no rebound, no guarding, no midline pulsatile mass, no hepatomegaly, no splenomegaly EXT:  2 plus pulses throughout, trace LE edema, no cyanosis no clubbing SKIN:  No rashes no nodules NEURO:  Cranial nerves II through XII grossly intact, motor grossly intact throughout PSYCH:  Cognitively intact, oriented to person place and time  Wt Readings from Last 3 Encounters:  12/23/22 197 lb 6.4 oz (89.5 kg)  09/25/22 195 lb (88.5 kg)  09/08/22 199 lb 9.6 oz (90.5 kg)     ASSESSMENT AND PLAN: .    # Hypertension Blood pressure readings fluctuating, with some readings in the 140s. Noted some lower diastolic readings. Mild peripheral edema noted on exam. -Discontinue Amlodipine due to concern for contributing to peripheral edema. -Start Valsartan 160mg  once  daily. -Check basic metabolic panel in one week to assess kidney function and electrolytes after starting Valsartan. -Follow up with pharmacist in one month to adjust blood pressure medication if needed.   # CAD:  # Hyperlipidemia LDL slightly elevated, last recorded at >100.  LDL goal is <70 given h/o CAD. -Switch Simvastatin to Rosuvastatin 20mg  daily. -Recheck lipid panel in three months.  # Atrial Fibrillation: s/p ablation. Intermittent episodes lasting approximately 30 minutes, self-resolving. Episodes seem to be unrelated to exercise, possible relation to large meals. -Continue Eliquis, dofetilide and carvedilol. -Continue EP follow up  # Diabetes Mellitus Recent HbA1c in the diabetes range (6.7%). -Continue Jardiance. -Encourage increased physical activity. -Limit carbohydrate intake  # Aortic aneurysm: Stable on recent imaging.  4.5cm on CT 05/2022. -Schedule echocardiogram in February to reassess aorta.              Dispo:  FU with PharmD in 1 month. FU with Dusan Lipford C. Duke Salvia, MD, [facility] in 5 months following repeat echo.  I,Mathew Stumpf,acting as a  scribe for Chilton Si, MD.,have documented all relevant documentation on the behalf of Chilton Si, MD,as directed by  Chilton Si, MD while in the presence of Chilton Si, MD.  I, Wilkie Zenon C. Duke Salvia, MD have reviewed all documentation for this visit.  The documentation of the exam, diagnosis, procedures, and orders on 12/23/2022 are all accurate and complete.   Signed, Chilton Si, MD

## 2022-12-30 LAB — BASIC METABOLIC PANEL
BUN/Creatinine Ratio: 18 (ref 10–24)
BUN: 21 mg/dL (ref 8–27)
CO2: 21 mmol/L (ref 20–29)
Calcium: 8.7 mg/dL (ref 8.6–10.2)
Chloride: 105 mmol/L (ref 96–106)
Creatinine, Ser: 1.14 mg/dL (ref 0.76–1.27)
Glucose: 117 mg/dL — ABNORMAL HIGH (ref 70–99)
Potassium: 4.6 mmol/L (ref 3.5–5.2)
Sodium: 139 mmol/L (ref 134–144)
eGFR: 66 mL/min/{1.73_m2} (ref >59)

## 2023-01-17 ENCOUNTER — Other Ambulatory Visit (HOSPITAL_COMMUNITY): Payer: Self-pay | Admitting: Physician Assistant

## 2023-01-20 ENCOUNTER — Ambulatory Visit: Payer: Medicare Other | Attending: Cardiology | Admitting: Pharmacist Clinician (PhC)/ Clinical Pharmacy Specialist

## 2023-01-20 ENCOUNTER — Encounter: Payer: Self-pay | Admitting: Pharmacist Clinician (PhC)/ Clinical Pharmacy Specialist

## 2023-01-20 VITALS — BP 148/78 | HR 54

## 2023-01-20 DIAGNOSIS — I10 Essential (primary) hypertension: Secondary | ICD-10-CM | POA: Diagnosis not present

## 2023-01-20 NOTE — Patient Instructions (Signed)
Follow up appointment: Monday Dec 2 at 9:30 am  Take your BP meds as follows: Continue with current medications for now.    Call me with a 3 week average of BP readings from first reading in the morning and last reading in the evening.   Also let me know if heart rate is mostly in 50's, 60's etc...   Belenda Cruise at [phone]  Check your blood pressure at home daily (if able) and keep record of the readings.  Hypertension "High blood pressure"  Hypertension is often called "The Silent Killer." It rarely causes symptoms until it is extremely  high or has done damage to other organs in the body. For this reason, you should have your  blood pressure checked regularly by your physician. We will check your blood pressure  every time you see a provider at one of our offices.   Your blood pressure reading consists of two numbers. Ideally, blood pressure should be  below 120/80. The first ("top") number is called the systolic pressure. It measures the  pressure in your arteries as your heart beats. The second ("bottom") number is called the diastolic pressure. It measures the pressure in your arteries as the heart relaxes between beats.  The benefits of getting your blood pressure under control are enormous. A 10-point  reduction in systolic blood pressure can reduce your risk of stroke by 27% and heart failure by 28%  Your blood pressure goal is < 130/80  To check your pressure at home you will need to:  1. Sit up in a chair, with feet flat on the floor and back supported. Do not cross your ankles or legs. 2. Rest your left arm so that the cuff is about heart level. If the cuff goes on your upper arm,  then just relax the arm on the table, arm of the chair or your lap. If you have a wrist cuff, we  suggest relaxing your wrist against your chest (think of it as Pledging the Flag with the  wrong arm).  3. Place the cuff snugly around your arm, about 1 inch above the crook of your elbow. The   cords should be inside the groove of your elbow.  4. Sit quietly, with the cuff in place, for about 5 minutes. After that 5 minutes press the power  button to start a reading. 5. Do not talk or move while the reading is taking place.  6. Record your readings on a sheet of paper. Although most cuffs have a memory, it is often  easier to see a pattern developing when the numbers are all in front of you.  7. You can repeat the reading after 1-3 minutes if it is recommended  Make sure your bladder is empty and you have not had caffeine or tobacco within the last 30 min  Always bring your blood pressure log with you to your appointments. If you have not brought your monitor in to be double checked for accuracy, please bring it to your next appointment.  You can find a list of quality blood pressure cuffs at validatebp.org

## 2023-01-20 NOTE — Assessment & Plan Note (Signed)
Assessment: BP is uncontrolled in office BP 148/78 mmHg;  above the goal (<130/80). Home readings mostly 120-130's systolic per wife, diastolic all < 80 Tolerates valsartan well without any side effects Denies SOB, palpitation, chest pain, headaches,or swelling Reiterated the importance of regular exercise and low salt diet   Plan:  Continue taking valsartan 160 mg daily Patient to keep record of BP readings with heart rate and report to [place] at the next visit Patient to follow up with Dr. Elberta Fortis in December  Labs ordered today: none

## 2023-01-20 NOTE — Progress Notes (Signed)
Office Visit    Patient Name: Douglas [name] Date of Encounter: 01/20/2023  Primary Care Provider:  Kaleen Mask, MD Primary Cardiologist:  Chilton Si, MD  Chief Complaint    Hypertension  Significant Past Medical History   CAD 2020 DES  HLD LDL > 100 on simvastatin; switched to rosuvastatin 20  AF S/P ablation CHADS2-VASc = 6, on Eliquis  DM2 Recent A1c 6.7, on Jardiance  Aortic aneurysm Stable 4.5 cm     Allergies  Allergen Reactions   Ace Inhibitors Cough   Bee Venom     Other reaction(s): swelling   Lisinopril Cough   Losartan Cough   Other Swelling    History of Present Illness    Douglas [name] is a 77 y.o. male patient of Dr Duke Salvia, in the office today for hypertension management.  He was seen last month for routine cardiology follow up and his pressure was noted to be 148/86.  Dr. Duke Salvia started valsartan 160 mg daily, and a follow up BMET showed no concerns.    Today he returns for follow up.  Has been doing well and has no concerns with the addition of valsartan 160 mg daily.  Did not bring home readings, but wife will average last 3 weeks of numbers and will call them to me when they get home.    Blood Pressure Goal:  130/80  Current Medications:  valsartan 160 mg every day, carvedilol 6.25 mg bid   Pharmacy: [facility]  Previously tried:  ACEI, losartan - cough  Family Hx:  both parents with heart disease; as well as siblings; daughter had   Social Hx:      Tobacco: no  Alcohol: no  Caffeine: tries to avoid  Diet:   mostly home cooked meals, plenty of vegetables; frozen or fresh; some snacking  Exercise:  works with horses, yard and garden  Home BP readings:  none with him today  low 150 down to 115/diastiolic wnl  Home cuff 3-5 years arm;   Last 3 weeks - AM average 131/75 HR 60    PM average 131/73  HR 60   Accessory Clinical Findings    Lab Results  Component Value Date   CREATININE 1.14  12/30/2022   BUN 21 12/30/2022   NA 139 12/30/2022   K 4.6 12/30/2022   CL 105 12/30/2022   CO2 21 12/30/2022   Lab Results  Component Value Date   ALT 13 02/14/2021   AST 17 02/14/2021   ALKPHOS 73 02/14/2021   BILITOT 0.5 02/14/2021   Lab Results  Component Value Date   HGBA1C 6.4 (H) 03/31/2019    Home Medications    Current Outpatient Medications  Medication Sig Dispense Refill   acetaminophen (TYLENOL) 500 MG tablet Take 500-1,000 mg by mouth every 6 (six) hours as needed (for pain.).     ampicillin (PRINCIPEN) 500 MG capsule Take 500 mg by mouth daily.     apixaban (ELIQUIS) 5 MG TABS tablet Take 1 tablet (5 mg total) by mouth 2 (two) times daily. 60 tablet 5   carvedilol (COREG) 6.25 MG tablet TAKE 1 TABLET BY MOUTH TWICE DAILY WITH A MEAL 180 tablet 3   Cholecalciferol (VITAMIN D) 2000 UNITS tablet Take 2,000 Units by mouth daily.     colchicine 0.6 MG tablet Take 0.6 mg by mouth 2 (two) times daily as needed (gout). Colcrys     dofetilide (TIKOSYN) 500 MCG capsule TAKE ONE CAPSULE BY MOUTH TWICE  DAILY 180 capsule 1   empagliflozin (JARDIANCE) 10 MG TABS tablet TAKE 1 TABLET BY MOUTH DAILY BEFORE BREAKFAST 90 tablet 2   EPINEPHrine 0.3 mg/0.3 mL IJ SOAJ injection Inject 0.3 mg into the muscle as needed for allergies.     fexofenadine (ALLEGRA) 180 MG tablet Take 180 mg by mouth daily as needed for allergies.     furosemide (LASIX) 40 MG tablet Take 1 tablet (40 mg total) by mouth daily as needed. 45 tablet 3   nitroGLYCERIN (NITROSTAT) 0.4 MG SL tablet Place 0.4 mg under the tongue every 5 (five) minutes as needed for chest pain.     omeprazole (PRILOSEC) 20 MG capsule Take 20 mg by mouth daily.     Polyethyl Glycol-Propyl Glycol (SYSTANE ULTRA) 0.4-0.3 % SOLN Place 1 drop into both eyes daily as needed (Dry eyes).     rosuvastatin (CRESTOR) 20 MG tablet Take 1 tablet (20 mg total) by mouth daily. 90 tablet 3   testosterone cypionate (DEPOTESTOSTERONE CYPIONATE) 200  MG/ML injection SMARTSIG:0.5-2 Milliliter(s) IM Every 2 Weeks PRN     valACYclovir (VALTREX) 1000 MG tablet Take 1,000 mg by mouth 2 (two) times daily as needed (fever blisters).     valsartan (DIOVAN) 160 MG tablet Take 1 tablet (160 mg total) by mouth daily. 90 tablet 3   No current facility-administered medications for this visit.     HYPERTENSION CONTROL Vitals:   01/20/23 0950 01/20/23 0954  BP: (!) 160/83 (!) 148/78    The patient's blood pressure is elevated above target today.  In order to address the patient's elevated BP:       Assessment & Plan    Essential hypertension Assessment: BP is uncontrolled in office BP 148/78 mmHg;  above the goal (<130/80). Home readings mostly 120-130's systolic per wife, diastolic all < 80 Tolerates valsartan well without any side effects Denies SOB, palpitation, chest pain, headaches,or swelling Reiterated the importance of regular exercise and low salt diet   Plan:  Continue taking valsartan 160 mg daily Patient to keep record of BP readings with heart rate and report to [place] at the next visit Patient to follow up with Dr. Elberta Fortis in December  Labs ordered today: none    Phillips Hay PharmD CPP [facility]  [address] [place], [place] [address] [phone]

## 2023-02-18 ENCOUNTER — Telehealth: Payer: Self-pay | Admitting: Pharmacist Clinician (PhC)/ Clinical Pharmacy Specialist

## 2023-02-18 NOTE — Telephone Encounter (Signed)
Spoke with wife, had concern for cost of Jardiance.  Not covered by the [facility] system and has reached the coverage gap.  Signed up for [employer] grant for ischemic cardiomyopathy.  Will MyChart the grant information to the family.

## 2023-02-25 ENCOUNTER — Telehealth: Payer: Self-pay | Admitting: Cardiovascular Disease

## 2023-02-25 NOTE — Telephone Encounter (Signed)
Pt c/o medication issue:  1. Name of Medication:   valsartan (DIOVAN) 160 MG tablet    2. How are you currently taking this medication (dosage and times per day)?   Take 1 tablet (160 mg total) by mouth daily.     3. Are you having a reaction (difficulty breathing--STAT)? No  4. What is your medication issue? Pt's wife would like a c/b from nurse regarding increasing dosage of medication. Please advise

## 2023-02-25 NOTE — Telephone Encounter (Signed)
Spoke with patient regarding blood pressure  Today at [facility] on arrival blood pressure 175, came down to 159/92 after sitting a while Patients blood pressure when he got home from [place] 136/92 Blood pressure readings at home prior to AM medications below,   136/84 145/82 133/84 127/81 117/70 124/74  Advised to start checking blood pressure at home about 1-2 hours after morning medications and call back with updated readings  Patient verbalized understanding

## 2023-02-26 NOTE — Telephone Encounter (Signed)
Continue to monitor at home.  Would suggest maybe see me or Caitlin in December and bring home device to determine validity of home readings.  Then we can decide if [facility] readings more in line with white coat or if home readings truly higher.

## 2023-02-26 NOTE — Telephone Encounter (Signed)
Scheduled visit for 12/10 at [facility] office  Patient aware of date, time and location

## 2023-03-09 ENCOUNTER — Telehealth (HOSPITAL_BASED_OUTPATIENT_CLINIC_OR_DEPARTMENT_OTHER): Payer: Self-pay | Admitting: Cardiovascular Disease

## 2023-03-09 NOTE — Telephone Encounter (Signed)
Average BP on those readings 137/85. Given he has some hypotension (BP 117/70) would defer further escalating antihypertensive regimen. Continue present medications and follow up as scheduled with PharmD in December.   Alver Sorrow, NP

## 2023-03-09 NOTE — Telephone Encounter (Signed)
Called patient and discussed recommendations. He verbalized understanding. Patient is aware of PharmD appointment.

## 2023-03-09 NOTE — Telephone Encounter (Signed)
Pt c/o BP issue: STAT if pt c/o blurred vision, one-sided weakness or slurred speech  1. What are your last 5 BP readings?   2 hours after med 125/75 11/14 144/89  11/15 117/70  11/16 144/87  11/17 157/102  11/18  2. Are you having any other symptoms (ex. Dizziness, headache, blurred vision, passed out)?   No  3. What is your BP issue?   Patient called to report his BP reading to [employer] as requested.

## 2023-03-23 ENCOUNTER — Other Ambulatory Visit (HOSPITAL_BASED_OUTPATIENT_CLINIC_OR_DEPARTMENT_OTHER): Payer: Self-pay | Admitting: Cardiovascular Disease

## 2023-03-23 ENCOUNTER — Ambulatory Visit: Payer: Medicare Other

## 2023-03-24 LAB — LIPID PANEL
Chol/HDL Ratio: 3.6 {ratio} (ref 0.0–5.0)
Cholesterol, Total: 120 mg/dL (ref 100–199)
HDL: 33 mg/dL — ABNORMAL LOW (ref >39)
LDL Chol Calc (NIH): 70 mg/dL (ref 0–99)
Triglycerides: 87 mg/dL (ref 0–149)
VLDL Cholesterol Cal: 17 mg/dL (ref 5–40)

## 2023-03-24 LAB — SPECIMEN STATUS REPORT

## 2023-03-31 ENCOUNTER — Ambulatory Visit
Payer: Medicare Other | Attending: Cardiovascular Disease | Admitting: Pharmacist Clinician (PhC)/ Clinical Pharmacy Specialist

## 2023-03-31 ENCOUNTER — Encounter: Payer: Self-pay | Admitting: Pharmacist Clinician (PhC)/ Clinical Pharmacy Specialist

## 2023-03-31 VITALS — BP 155/92 | HR 52

## 2023-03-31 DIAGNOSIS — I10 Essential (primary) hypertension: Secondary | ICD-10-CM | POA: Diagnosis not present

## 2023-03-31 MED ORDER — AMLODIPINE BESYLATE 5 MG PO TABS: 5.0000 mg | ORAL_TABLET | Freq: Every day | ORAL | 1 refills | Status: DC

## 2023-03-31 NOTE — Assessment & Plan Note (Signed)
Assessment: BP is uncontrolled in office BP 155/92 mmHg;  above the goal (<130/80). Home readings better, although still erratic Tolerates valsartan and carvedilol well without any side effects Denies SOB, palpitation, chest pain, headaches,or swelling Reiterated the importance of regular exercise and low salt diet   Plan:  Start taking amlodipine 5 mg once daily in the evenings Continue taking valsartan in the mornings, carvedilol twice daily Patient to keep record of BP readings with heart rate and report to [place] at the next visit Will reach out in January to see how home readings look - he should send message if any concerns.  Labs ordered today:  none

## 2023-03-31 NOTE — Progress Notes (Signed)
Office Visit    Patient Name: Douglas [name] Date of Encounter: 03/31/2023  Primary Care Provider:  Kaleen Mask, MD Primary Cardiologist:  Chilton Si, MD  Chief Complaint    Hypertension  Significant Past Medical History   CAD 2020 DES  HLD LDL > 100 on simvastatin; switched to rosuvastatin 20  AF S/P ablation CHADS2-VASc = 6, on Eliquis  DM2 Recent A1c 6.7, on Jardiance  Aortic aneurysm Stable 4.5 cm     Allergies  Allergen Reactions   Ace Inhibitors Cough   Bee Venom     Other reaction(s): swelling   Lisinopril Cough   Losartan Cough   Other Swelling    History of Present Illness    Douglas [name] is a 77 y.o. male patient of Dr Duke Salvia, in the office today for hypertension management.  He was seen last month for routine cardiology follow up and his pressure was noted to be 148/86.  Dr. Duke Salvia started valsartan 160 mg daily, and a follow up BMET showed no concerns.   Over the past few months he continues to report readings up into the 150's, with occasional drops to 110's.   He notes that prior to starting valsartan his pressure was usually at goal.  He was on amlodipine 10 mg, but was discontinued 2/2 LEE.    Today he returns for follow up.   Still having some readings in the 150's, unsure if related to changes in diet/lifestyle.   Blood Pressure Goal:  130/80  Current Medications:  valsartan 160 mg every day, carvedilol 6.25 mg bid   Pharmacy: [facility]  Previously tried:  ACEI, losartan - cough  Family Hx:  both parents with heart disease; as well as siblings; daughter had   Social Hx:      Tobacco: no   Alcohol: no  Caffeine: tries to avoid  Diet:   mostly home cooked meals, plenty of vegetables; frozen or fresh; some snacking  Exercise:  works with horses, yard and garden  Home BP readings:  brings home device today, Omron, read within 10 points of office device  Average 126/76 (range  105-150/68-93)     Accessory Clinical Findings    Lab Results  Component Value Date   CREATININE 1.14 12/30/2022   BUN 21 12/30/2022   NA 139 12/30/2022   K 4.6 12/30/2022   CL 105 12/30/2022   CO2 21 12/30/2022   Lab Results  Component Value Date   ALT 13 02/14/2021   AST 17 02/14/2021   ALKPHOS 73 02/14/2021   BILITOT 0.5 02/14/2021   Lab Results  Component Value Date   HGBA1C 6.4 (H) 03/31/2019    Home Medications    Current Outpatient Medications  Medication Sig Dispense Refill   amLODipine (NORVASC) 5 MG tablet Take 1 tablet (5 mg total) by mouth daily. 90 tablet 1   acetaminophen (TYLENOL) 500 MG tablet Take 500-1,000 mg by mouth every 6 (six) hours as needed (for pain.).     ampicillin (PRINCIPEN) 500 MG capsule Take 500 mg by mouth daily.     apixaban (ELIQUIS) 5 MG TABS tablet Take 1 tablet (5 mg total) by mouth 2 (two) times daily. 60 tablet 5   carvedilol (COREG) 6.25 MG tablet TAKE 1 TABLET BY MOUTH TWICE DAILY WITH A MEAL 180 tablet 3   Cholecalciferol (VITAMIN D) 2000 UNITS tablet Take 2,000 Units by mouth daily.     colchicine 0.6 MG tablet Take 0.6 mg by mouth  2 (two) times daily as needed (gout). Colcrys     dofetilide (TIKOSYN) 500 MCG capsule TAKE ONE CAPSULE BY MOUTH TWICE DAILY 180 capsule 1   empagliflozin (JARDIANCE) 10 MG TABS tablet TAKE 1 TABLET BY MOUTH DAILY BEFORE BREAKFAST 90 tablet 2   EPINEPHrine 0.3 mg/0.3 mL IJ SOAJ injection Inject 0.3 mg into the muscle as needed for allergies.     fexofenadine (ALLEGRA) 180 MG tablet Take 180 mg by mouth daily as needed for allergies.     furosemide (LASIX) 40 MG tablet Take 1 tablet (40 mg total) by mouth daily as needed. 45 tablet 3   nitroGLYCERIN (NITROSTAT) 0.4 MG SL tablet Place 0.4 mg under the tongue every 5 (five) minutes as needed for chest pain.     omeprazole (PRILOSEC) 20 MG capsule Take 20 mg by mouth daily.     Polyethyl Glycol-Propyl Glycol (SYSTANE ULTRA) 0.4-0.3 % SOLN Place 1 drop  into both eyes daily as needed (Dry eyes).     rosuvastatin (CRESTOR) 20 MG tablet Take 1 tablet (20 mg total) by mouth daily. 90 tablet 3   testosterone cypionate (DEPOTESTOSTERONE CYPIONATE) 200 MG/ML injection SMARTSIG:0.5-2 Milliliter(s) IM Every 2 Weeks PRN     valACYclovir (VALTREX) 1000 MG tablet Take 1,000 mg by mouth 2 (two) times daily as needed (fever blisters).     valsartan (DIOVAN) 160 MG tablet Take 1 tablet (160 mg total) by mouth daily. 90 tablet 3   No current facility-administered medications for this visit.     HYPERTENSION CONTROL Vitals:   03/31/23 1107 03/31/23 1113  BP: (!) 162/92 (!) 155/92    The patient's blood pressure is elevated above target today.  In order to address the patient's elevated BP: A new medication was prescribed today.      Assessment & Plan    Essential hypertension Assessment: BP is uncontrolled in office BP 155/92 mmHg;  above the goal (<130/80). Home readings better, although still erratic Tolerates valsartan and carvedilol well without any side effects Denies SOB, palpitation, chest pain, headaches,or swelling Reiterated the importance of regular exercise and low salt diet   Plan:  Start taking amlodipine 5 mg once daily in the evenings Continue taking valsartan in the mornings, carvedilol twice daily Patient to keep record of BP readings with heart rate and report to [place] at the next visit Will reach out in January to see how home readings look - he should send message if any concerns.  Labs ordered today:  none   Phillips Hay PharmD CPP [facility]  [address] [place], [place] [address] [phone]

## 2023-03-31 NOTE — Patient Instructions (Signed)
I will send you a MyChart message in January to see how your readings are.  If you find that they are running high (or low) please reach out sooner  Take your BP meds as follows:  Start amlodipine 5 mg once daily in the evenings   Check your blood pressure at home daily (if able) and keep record of the readings.  Your blood pressure goal is < 130/80  To check your pressure at home you will need to:  1. Sit up in a chair, with feet flat on the floor and back supported. Do not cross your ankles or legs. 2. Rest your left arm so that the cuff is about heart level. If the cuff goes on your upper arm,  then just relax the arm on the table, arm of the chair or your lap. If you have a wrist cuff, we  suggest relaxing your wrist against your chest (think of it as Pledging the Flag with the  wrong arm).  3. Place the cuff snugly around your arm, about 1 inch above the crook of your elbow. The  cords should be inside the groove of your elbow.  4. Sit quietly, with the cuff in place, for about 5 minutes. After that 5 minutes press the power  button to start a reading. 5. Do not talk or move while the reading is taking place.  6. Record your readings on a sheet of paper. Although most cuffs have a memory, it is often  easier to see a pattern developing when the numbers are all in front of you.  7. You can repeat the reading after 1-3 minutes if it is recommended  Make sure your bladder is empty and you have not had caffeine or tobacco within the last 30 min  Always bring your blood pressure log with you to your appointments. If you have not brought your monitor in to be double checked for accuracy, please bring it to your next appointment.  You can find a list of quality blood pressure cuffs at [email or web address]  Important lifestyle changes to control high blood pressure  Intervention  Effect on the BP  Lose extra pounds and watch your waistline Weight loss is one of the most effective lifestyle  changes for controlling blood pressure. If you're overweight or obese, losing even a small amount of weight can help reduce blood pressure. Blood pressure might go down by about 1 millimeter of mercury (mm Hg) with each kilogram (about 2.2 pounds) of weight lost.  Exercise regularly As a general goal, aim for at least 30 minutes of moderate physical activity every day. Regular physical activity can lower high blood pressure by about 5 to 8 mm Hg.  Eat a healthy diet Eating a diet rich in whole grains, fruits, vegetables, and low-fat dairy products and low in saturated fat and cholesterol. A healthy diet can lower high blood pressure by up to 11 mm Hg.  Reduce salt (sodium) in your diet Even a small reduction of sodium in the diet can improve heart health and reduce high blood pressure by about 5 to 6 mm Hg.  Limit alcohol One drink equals 12 ounces of beer, 5 ounces of wine, or 1.5 ounces of 80-proof liquor.  Limiting alcohol to less than one drink a day for women or two drinks a day for men can help lower blood pressure by about 4 mm Hg.   If you have any questions or concerns please use My Chart to send questions  or call the office at [phone]

## 2023-04-03 NOTE — Progress Notes (Unsigned)
  Electrophysiology Office Note:   Date:  04/06/2023  ID:  Douglas [name], Douglas [name] [birth year], MRN [id number]  Primary Cardiologist: Chilton Si, MD Electrophysiologist: Will Jorja Loa, MD      History of Present Illness:   Douglas [name] is a 77 y.o. male with h/o Diastolic CHF, AAA, CAD s/p CABG, OSA on CPAP, HTN, and PAF/AFL seen today for routine electrophysiology followup.   Since last being seen in our clinic the patient reports doing very well. Overall,  he denies chest pain, palpitations, dyspnea, PND, orthopnea, nausea, vomiting, dizziness, syncope, edema, weight gain, or early satiety.   Review of systems complete and found to be negative unless listed in HPI.   EP Information / Studies Reviewed:    EKG is ordered today. Personal review as below.  EKG Interpretation Date/Time:  Monday April 06 2023 10:11:34 EST Ventricular Rate:  54 PR Interval:  182 QRS Duration:  92 QT Interval:  464 QTC Calculation: 440 R Axis:   -58  Text Interpretation: Sinus bradycardia Left anterior fascicular block Confirmed by Maxine Glenn [phone]) on 04/06/2023 10:17:07 AM    Arrhythmia History  S/p Ablation 06/28/2022 Manged on tikosyn.   Physical Exam:   VS:  BP 120/78   Pulse (!) 54   Ht 5\' 7"  (1.702 m)   Wt 196 lb 9.6 oz (89.2 kg)   SpO2 97%   BMI 30.79 kg/m    Wt Readings from Last 3 Encounters:  04/06/23 196 lb 9.6 oz (89.2 kg)  12/23/22 197 lb 6.4 oz (89.5 kg)  09/25/22 195 lb (88.5 kg)     GEN: Well nourished, well developed in no acute distress NECK: No JVD; No carotid bruits CARDIAC: Regular rate and rhythm, no murmurs, rubs, gallops RESPIRATORY:  Clear to auscultation without rales, wheezing or rhonchi  ABDOMEN: Soft, non-tender, non-distended EXTREMITIES:  No edema; No deformity   ASSESSMENT AND PLAN:    Persistent atrial fib Atrial flutter EKG today shows NSR with stable intervals Continue eliquis 5 mg BID for CHA2DS2VASc of at least  5 Continue tikosyn 500 mcg BId  Secondary hypercoagulable state Pt on Eliquis as above   Chronic systolic CHF CAD s/p CABG Denies s/s ischemia Volume status stable one exam  OSA  Encouraged nightly CPAP   Follow up with Dr. Elberta Fortis in 6 months  Signed, Graciella Freer, PA-C

## 2023-04-06 ENCOUNTER — Ambulatory Visit: Payer: Medicare Other | Attending: Student | Admitting: Student

## 2023-04-06 ENCOUNTER — Encounter: Payer: Self-pay | Admitting: Student

## 2023-04-06 VITALS — BP 120/78 | HR 54 | Ht 67.0 in | Wt 196.6 lb

## 2023-04-06 DIAGNOSIS — I10 Essential (primary) hypertension: Secondary | ICD-10-CM | POA: Diagnosis not present

## 2023-04-06 DIAGNOSIS — Z5181 Encounter for therapeutic drug level monitoring: Secondary | ICD-10-CM

## 2023-04-06 DIAGNOSIS — I480 Paroxysmal atrial fibrillation: Secondary | ICD-10-CM

## 2023-04-06 DIAGNOSIS — I4819 Other persistent atrial fibrillation: Secondary | ICD-10-CM

## 2023-04-06 DIAGNOSIS — I5042 Chronic combined systolic (congestive) and diastolic (congestive) heart failure: Secondary | ICD-10-CM | POA: Diagnosis not present

## 2023-04-06 DIAGNOSIS — G4733 Obstructive sleep apnea (adult) (pediatric): Secondary | ICD-10-CM

## 2023-04-06 DIAGNOSIS — D6869 Other thrombophilia: Secondary | ICD-10-CM

## 2023-04-06 NOTE — Patient Instructions (Signed)
Medication Instructions:  Your physician recommends that you continue on your current medications as directed. Please refer to the Current Medication list given to you today.  *If you need a refill on your cardiac medications before your next appointment, please call your pharmacy*  Lab Work: BMET, CBC, MAG-TODAY If you have labs (blood work) drawn today and your tests are completely normal, you will receive your results only by: MyChart Message (if you have MyChart) OR A paper copy in the mail If you have any lab test that is abnormal or we need to change your treatment, we will call you to review the results.  Follow-Up: At [facility], you and your health needs are our priority.  As part of our continuing mission to provide you with exceptional heart care, we have created designated Provider Care Teams.  These Care Teams include your primary Cardiologist (physician) and Advanced Practice Providers (APPs -  Physician Assistants and Nurse Practitioners) who all work together to provide you with the care you need, when you need it.  Your next appointment:   6 month(s)  Provider:   Loman Brooklyn, MD

## 2023-04-07 LAB — BASIC METABOLIC PANEL
BUN/Creatinine Ratio: 20 (ref 10–24)
BUN: 20 mg/dL (ref 8–27)
CO2: 22 mmol/L (ref 20–29)
Calcium: 9 mg/dL (ref 8.6–10.2)
Chloride: 106 mmol/L (ref 96–106)
Creatinine, Ser: 0.99 mg/dL (ref 0.76–1.27)
Glucose: 96 mg/dL (ref 70–99)
Potassium: 5.1 mmol/L (ref 3.5–5.2)
Sodium: 142 mmol/L (ref 134–144)
eGFR: 78 mL/min/{1.73_m2} (ref >59)

## 2023-04-07 LAB — MAGNESIUM: Magnesium: 2.2 mg/dL (ref 1.6–2.3)

## 2023-04-07 LAB — CBC
Hematocrit: 54.6 % — ABNORMAL HIGH (ref 37.5–51.0)
Hemoglobin: 17 g/dL (ref 13.0–17.7)
MCH: 26.8 pg (ref 26.6–33.0)
MCHC: 31.1 g/dL — ABNORMAL LOW (ref 31.5–35.7)
MCV: 86 fL (ref 79–97)
Platelets: 147 10*3/uL — ABNORMAL LOW (ref 150–450)
RBC: 6.35 x10E6/uL — ABNORMAL HIGH (ref 4.14–5.80)
RDW: 19 % — ABNORMAL HIGH (ref 11.6–15.4)
WBC: 6.3 10*3/uL (ref 3.4–10.8)

## 2023-04-08 ENCOUNTER — Ambulatory Visit: Payer: Medicare Other | Admitting: Cardiology

## 2023-05-07 ENCOUNTER — Encounter: Payer: Self-pay | Admitting: Pharmacist Clinician (PhC)/ Clinical Pharmacy Specialist

## 2023-05-25 ENCOUNTER — Encounter (HOSPITAL_BASED_OUTPATIENT_CLINIC_OR_DEPARTMENT_OTHER): Payer: Self-pay

## 2023-05-25 ENCOUNTER — Ambulatory Visit (HOSPITAL_BASED_OUTPATIENT_CLINIC_OR_DEPARTMENT_OTHER): Payer: Medicare Other

## 2023-05-25 DIAGNOSIS — I7121 Aneurysm of the ascending aorta, without rupture: Secondary | ICD-10-CM

## 2023-05-25 DIAGNOSIS — I77810 Thoracic aortic ectasia: Secondary | ICD-10-CM | POA: Diagnosis not present

## 2023-05-25 LAB — ECHOCARDIOGRAM COMPLETE
Area-P 1/2: 3.08 cm2
P 1/2 time: 758 ms
S' Lateral: 3.02 cm

## 2023-05-25 MED ORDER — PERFLUTREN LIPID MICROSPHERE
1.0000 mL | INTRAVENOUS | Status: AC | PRN
  Administered 2023-05-25: 1 mL via INTRAVENOUS

## 2023-05-26 ENCOUNTER — Telehealth (HOSPITAL_BASED_OUTPATIENT_CLINIC_OR_DEPARTMENT_OTHER): Payer: Self-pay

## 2023-05-26 DIAGNOSIS — I7121 Aneurysm of the ascending aorta, without rupture: Secondary | ICD-10-CM

## 2023-05-26 NOTE — Telephone Encounter (Signed)
-----   Message from Reche GORMAN Finder sent at 05/25/2023  8:03 PM EST ----- Echocardiogram normal heart pumping function. Heart muscle moderateraly stiff. Ascending aorta 45mm which is stable from previous. Repeat echo in 1 year for monitoring.   Wall motion abnormalities more notable than prior echocardiogram. If any new chest pain or shortness of breath please contact the office for sooner appointment. If no new symptoms, follow up as scheduled in March.

## 2023-06-08 ENCOUNTER — Other Ambulatory Visit: Payer: Self-pay

## 2023-06-08 MED ORDER — CARVEDILOL 6.25 MG PO TABS: 6.2500 mg | ORAL_TABLET | Freq: Two times a day (BID) | ORAL | 2 refills | Status: DC

## 2023-06-22 ENCOUNTER — Ambulatory Visit (HOSPITAL_BASED_OUTPATIENT_CLINIC_OR_DEPARTMENT_OTHER): Payer: Medicare Other | Admitting: Family

## 2023-06-23 ENCOUNTER — Other Ambulatory Visit (HOSPITAL_BASED_OUTPATIENT_CLINIC_OR_DEPARTMENT_OTHER)

## 2023-06-23 ENCOUNTER — Ambulatory Visit (HOSPITAL_BASED_OUTPATIENT_CLINIC_OR_DEPARTMENT_OTHER): Payer: Medicare Other | Admitting: Family

## 2023-06-23 ENCOUNTER — Encounter (HOSPITAL_BASED_OUTPATIENT_CLINIC_OR_DEPARTMENT_OTHER): Payer: Self-pay | Admitting: Family

## 2023-06-23 VITALS — BP 128/76 | HR 66 | Ht 67.0 in | Wt 196.2 lb

## 2023-06-23 DIAGNOSIS — I480 Paroxysmal atrial fibrillation: Secondary | ICD-10-CM

## 2023-06-23 DIAGNOSIS — R002 Palpitations: Secondary | ICD-10-CM

## 2023-06-23 DIAGNOSIS — I25118 Atherosclerotic heart disease of native coronary artery with other forms of angina pectoris: Secondary | ICD-10-CM

## 2023-06-23 DIAGNOSIS — Z951 Presence of aortocoronary bypass graft: Secondary | ICD-10-CM

## 2023-06-23 DIAGNOSIS — D6859 Other primary thrombophilia: Secondary | ICD-10-CM

## 2023-06-23 DIAGNOSIS — E785 Hyperlipidemia, unspecified: Secondary | ICD-10-CM

## 2023-06-23 DIAGNOSIS — G4733 Obstructive sleep apnea (adult) (pediatric): Secondary | ICD-10-CM

## 2023-06-23 NOTE — Patient Instructions (Signed)
 Medication Instructions:  Your physician recommends that you continue on your current medications as directed. Please refer to the Current Medication list given to you today.  *If you need a refill on your cardiac medications before your next appointment, please call your pharmacy*   Lab Work: Your physician recommends that you return for lab work today- BMP, CBC, Mag, and TSH  If you have labs (blood work) drawn today and your tests are completely normal, you will receive your results only by: MyChart Message (if you have MyChart) OR A paper copy in the mail If you have any lab test that is abnormal or we need to change your treatment, we will call you to review the results.   Testing/Procedures: Your physician has recommended that you wear a Zio monitor.   This monitor is a medical device that records the heart's electrical activity. Doctors most often use these monitors to diagnose arrhythmias. Arrhythmias are problems with the speed or rhythm of the heartbeat. The monitor is a small device applied to your chest. You can wear one while you do your normal daily activities. While wearing this monitor if you have any symptoms to push the button and record what you felt. Once you have worn this monitor for the period of time provider prescribed (Usually 14 days), you will return the monitor device in the postage paid box. Once it is returned they will download the data collected and provide [place] with a report which the provider will then review and we will call you with those results. Important tips:  Avoid showering during the first 24 hours of wearing the monitor. Avoid excessive sweating to help maximize wear time. Do not submerge the device, no hot tubs, and no swimming pools. Keep any lotions or oils away from the patch. After 24 hours you may shower with the patch on. Take brief showers with your back facing the shower head.  Do not remove patch once it has been placed because that will  interrupt data and decrease adhesive wear time. Push the button when you have any symptoms and write down what you were feeling. Once you have completed wearing your monitor, remove and place into box which has postage paid and place in your outgoing mailbox.  If for some reason you have misplaced your box then call our office and we can provide another box and/or mail it off for you.   Follow-Up: At [facility], you and your health needs are our priority.  As part of our continuing mission to provide you with exceptional heart care, we have created designated Provider Care Teams.  These Care Teams include your primary Cardiologist (physician) and Advanced Practice Providers (APPs -  Physician Assistants and Nurse Practitioners) who all work together to provide you with the care you need, when you need it.  We recommend signing up for the patient portal called "MyChart".  Sign up information is provided on this After Visit Summary.  MyChart is used to connect with patients for Virtual Visits (Telemedicine).  Patients are able to view lab/test results, encounter notes, upcoming appointments, etc.  Non-urgent messages can be sent to your provider as well.   To learn more about what you can do with MyChart, go to [email or web address].    Your next appointment:   6 months or sooner if needed based on monitor results with Dr. Duke Salvia or Gillian Shields, NP

## 2023-06-23 NOTE — Progress Notes (Signed)
 Cardiology Office Note:  .   Date:  06/23/2023  ID:  Douglas [name], DOB 1945-06-08, MRN [id number] PCP: Douglas Mask, MD  [facility] Providers Cardiologist:  Douglas Si, MD Electrophysiologist:  Douglas Jorja Loa, MD    History of Present Illness: .   Douglas [name] is a 78 y.o. male  with a hx of CAD s/p  MI and PCI (LAD 2007, RCA 2020), hiatal hernia repair 03/2019 persistent atrial fibrillation, chronic combined systolic and diastolic heart failure, DM2, hypertension, hyperlipidemia, chronic occult GI bleed, OSA on CPAP.    Amlodipine previously reduced due to hypotension after 30 pound weight loss associated with hiatal hernia surgery to also 2020.   Onset atrial fibrillation 06/2020.  TEE cardioversion with LVEF 45-50% with apical hypokinesis.  He had recurrent atrial fibrillation 07/2020.  08/2020 was started on Tikosyn.   He saw Dr. Duke [name] 10/31/2021 and Douglas [name] was added for optimization for heart failure therapy.   Underwent atrial fibrillation ablation 06/27/22.   12/23/22 Amlodipine stopped due to LE edema. Valsartan initiated. Simvastatin switched to Rosuvastatin. 03/2023 Amlodipine 5mg  added.    He presents today for follow-up He has been checking BP at home daily with some sporadic low readings. He reports not feeling bad with the low readings. He is feeling palpitations every other day. This is new and has made him have to slow down when doing his daily activities. He denies extra stress or sickness and is staying hydrated.  He has been taking amlopipine and has not noticed any swelling. Reports no shortness of breath nor dyspnea on exertion. Reports no chest pain, pressure, or tightness. No edema, orthopnea, PND.   4/24 A1C 6.7.   ROS: Please see the history of present illness.    All other systems reviewed and are negative.  Studies Reviewed: .        Cardiac Studies & Procedures    ______________________________________________________________________________________________ CARDIAC CATHETERIZATION  CARDIAC CATHETERIZATION 05/11/2018  Narrative Images from the original result were not included.   Prox RCA lesion is 75% stenosed.  A drug-eluting stent was successfully placed using a STENT ORSIRO 3.0X18. -Postdilated to 3.6 mm  Post intervention, there is a 0% residual stenosis.  Mid RCA lesion is 20% stenosed. Dist RCA lesion is 20% stenosed with 20% stenosed side branch in Ost RPDA.  There is mild left ventricular systolic dysfunction. The left ventricular ejection fraction is 45-50% by visual estimate. LV end diastolic pressure is moderately elevated.  Prox LAD to Mid LAD long stented segment is 10% stenosed.  SUMMARY  Two-vessel disease with widely patent stent in the proximal LAD and 75-80% proximal RCA stenosis.  Successful DES PCI of proximal RCA using Osiro DES 3.0 mm x 18 mm postdilated to 3.6 mm.  Moderately elevated LVEDP.  Low normal EF of 45 and 50%.  Difficult to assess regional wall motion normality.  RECOMMENDATIONS  Continue aspirin plus Plavix, if the plan Douglas be to convert to DOAC because of A. fib, would simply stop aspirin after 1 month.  Uncomplicated PCI, okay for discharge home today as same-day discharge stable.  Continue respect modification.  Continue beta-blocker   Follow-up with Dr. Duke [name].    Douglas [name], M.D., M.S. Interventional Cardiologist  Pager # [phone] Phone # [phone] [address] [place], [place] [address]  Findings Coronary Findings Diagnostic  Dominance: Right  Left Main Vessel is large.  Left Anterior Descending Prox LAD to Mid LAD lesion is 10% stenosed. The lesion was  previously treated using a drug eluting stent between 1-2 years ago. Previously placed stent displays restenosis.  First Diagonal Branch Vessel is small in size.  Second Diagonal Branch Vessel  is moderate in size.  Second Septal Branch Vessel is small in size.  Third Septal Branch Vessel is small in size.  Ramus Intermedius Vessel is large. Vessel is angiographically normal.  Left Circumflex Vessel is large. Vessel is angiographically normal.  First Obtuse Marginal Branch Vessel is small in size.  Second Obtuse Marginal Branch Vessel is moderate in size. The vessel exhibits minimal luminal irregularities.  Third Obtuse Marginal Branch Vessel is small in size.  Right Coronary Artery Prox RCA lesion is 75% stenosed. The lesion is focal and eccentric. Mid RCA lesion is 20% stenosed. The lesion is focal and eccentric. Dist RCA lesion is 20% stenosed with 20% stenosed side branch in Ost RPDA. The lesion is located at the bifurcation.  Acute Marginal Branch Vessel is small in size.  Right Posterior Descending Artery Vessel is moderate in size. Vessel is angiographically normal.  Right Posterior Atrioventricular Artery Vessel is moderate in size. Vessel is angiographically normal.  First Right Posterolateral Branch Vessel is small in size.  Second Right Posterolateral Branch Vessel is small in size.  Intervention  Prox RCA lesion Stent Lesion length:  15 mm. CATH VISTA GUIDE 6FR JR4 guide catheter was inserted. Lesion crossed with guidewire using a WIRE ASAHI PROWATER 180CM. Pre-stent angioplasty was performed using a BALLOON SAPPHIRE 2.5X15. Maximum pressure:  12 atm. Inflation time: 20 sec. A drug-eluting stent was successfully placed using a STENT ORSIRO 3.0X18. Maximum pressure: 16 atm. Inflation time: 30 sec. Minimum lumen area:  3.6 mm. Stent strut is well apposed. Post-stent angioplasty was performed using a BALLOON SAPPHIRE [place] 3.5X12. Maximum pressure:  16 atm. Inflation time:  30 sec. Post-Intervention Lesion Assessment The intervention was successful. Pre-interventional TIMI flow is 3. Post-intervention TIMI flow is 3. Treated lesion length:  18 mm. No  complications occurred at this lesion. There is a 0% residual stenosis post intervention.   STRESS TESTS  MYOCARDIAL PERFUSION IMAGING 01/19/2019  Narrative  The left ventricular ejection fraction is mildly decreased (45-54%).  Nuclear stress EF: 50%.  There was no ST segment deviation noted during stress.  No T wave inversion was noted during stress.  Defect 1: There is a large defect of severe severity.  Findings consistent with prior myocardial infarction with peri-infarct ischemia.  This is an intermediate risk study.  Large size, severe intensity, partially reversible (SDS 4) mid to distal anterior, anteroseptal, apical septal and apical perfusion defect, suggestive of scar with peri-infarct ischemia. LVEF 50% with anteroapical and apical akinesis. Compared to a prior study in 2014, there was a previously noted infarct in this area with minimal peri-infarct ischemia. Intermediate risk study. Clinical correlation advised.   ECHOCARDIOGRAM  ECHOCARDIOGRAM COMPLETE 05/25/2023  Narrative ECHOCARDIOGRAM REPORT    Patient Name:   DILYN SMILES Date of Exam: 05/25/2023 Medical Rec #:  [id number]              Height:       67.0 in Accession #:    [id number]             Weight:       196.6 lb Date of Birth:  04-04-1946             BSA:          2.008 m Patient Age:    47 years  BP:           110/70 mmHg Patient Gender: M                      HR:           53 bpm. Exam Location:  Outpatient  Procedure: 2D Echo, 3D Echo, Color Doppler, Cardiac Doppler and Strain Analysis  Indications:    Aortic dilatation  History:        Patient has prior history of Echocardiogram examinations, most recent 05/12/2022. Arrythmias:Atrial Fibrillation, Signs/Symptoms:Chest Pain; Risk Factors:Diabetes, Former Smoker, Dyslipidemia and Hypertension. NSVT;.  Sonographer:    Jeryl Columbia RDCS Referring Phys: [id number] Raelin Pixler S Tanaisha Pittman   Sonographer Comments: postdefinity  130/68 IMPRESSIONS   1. Left ventricular ejection fraction, by estimation, is 55 to 60%. The left ventricle has normal function. The left ventricle demonstrates regional wall motion abnormalities (see scoring diagram/findings for description). There is moderate concentric left ventricular hypertrophy. Left ventricular diastolic parameters are indeterminate. 2. Right ventricular systolic function is normal. The right ventricular size is mildly enlarged. Tricuspid regurgitation signal is inadequate for assessing PA pressure. 3. Left atrial size was severely dilated. 4. The mitral valve is grossly normal. Trivial mitral valve regurgitation. No evidence of mitral stenosis. 5. The aortic valve is tricuspid. Aortic valve regurgitation is mild. No aortic stenosis is present. 6. Aneurysm of the ascending aorta, measuring 45 mm. 7. The inferior vena cava is normal in size with greater than 50% respiratory variability, suggesting right atrial pressure of 3 mmHg.  Comparison(s): Changes from prior study are noted. Apical septum and apical WMA are more apparent on the current study. Ascending aorta remains stable.  FINDINGS Left Ventricle: Left ventricular ejection fraction, by estimation, is 55 to 60%. The left ventricle has normal function. The left ventricle demonstrates regional wall motion abnormalities. Definity contrast agent was given IV to delineate the left ventricular endocardial borders. The left ventricular internal cavity size was normal in size. There is moderate concentric left ventricular hypertrophy. Left ventricular diastolic parameters are indeterminate.   LV Wall Scoring: The apical septal segment, apical anterior segment, and apex are hypokinetic.  Right Ventricle: The right ventricular size is mildly enlarged. No increase in right ventricular wall thickness. Right ventricular systolic function is normal. Tricuspid regurgitation signal is inadequate for assessing PA pressure.  Left  Atrium: Left atrial size was severely dilated.  Right Atrium: Right atrial size was normal in size.  Pericardium: There is no evidence of pericardial effusion.  Mitral Valve: The mitral valve is grossly normal. Trivial mitral valve regurgitation. No evidence of mitral valve stenosis.  Tricuspid Valve: The tricuspid valve is grossly normal. Tricuspid valve regurgitation is trivial. No evidence of tricuspid stenosis.  Aortic Valve: The aortic valve is tricuspid. Aortic valve regurgitation is mild. Aortic regurgitation PHT measures 758 msec. No aortic stenosis is present.  Pulmonic Valve: The pulmonic valve was grossly normal. Pulmonic valve regurgitation is not visualized. No evidence of pulmonic stenosis.  Aorta: The aortic root is normal in size and structure. There is an aneurysm involving the ascending aorta measuring 45 mm.  Venous: The inferior vena cava is normal in size with greater than 50% respiratory variability, suggesting right atrial pressure of 3 mmHg.  IAS/Shunts: The atrial septum is grossly normal.   LEFT VENTRICLE PLAX 2D LVIDd:         4.75 cm   Diastology LVIDs:         3.02 cm   LV e'  medial:    5.11 cm/s LV PW:         1.48 cm   LV E/e' medial:  12.3 LV IVS:        1.42 cm   LV e' lateral:   9.90 cm/s LVOT diam:     1.90 cm   LV E/e' lateral: 6.4 LV SV:         75 LV SV Index:   37 LVOT Area:     2.84 cm  3D Volume EF: 3D EF:        62 % LV EDV:       183 ml LV ESV:       69 ml LV SV:        114 ml  RIGHT VENTRICLE RV Basal diam:  3.38 cm RV Mid diam:    2.80 cm RV S prime:     11.50 cm/s TAPSE (M-mode): 2.2 cm  LEFT ATRIUM             Index        RIGHT ATRIUM           Index LA diam:        3.90 cm 1.94 cm/m   RA Area:     19.30 cm LA Vol (A2C):   54.9 ml 27.35 ml/m  RA Volume:   60.30 ml  30.04 ml/m LA Vol (A4C):   99.1 ml 49.36 ml/m LA Biplane Vol: 77.2 ml 38.45 ml/m AORTIC VALVE LVOT Vmax:   111.00 cm/s LVOT Vmean:  73.900 cm/s LVOT  VTI:    0.264 m AI PHT:      758 msec  AORTA Ao Root diam: 3.80 cm Ao Asc diam:  4.51 cm  MITRAL VALVE MV Area (PHT): 3.08 cm    SHUNTS MV Decel Time: 246 msec    Systemic VTI:  0.26 m MV E velocity: 62.90 cm/s  Systemic Diam: 1.90 cm MV A velocity: 35.10 cm/s MV E/A ratio:  1.79  Lennie Odor MD Electronically signed by Lennie Odor MD Signature Date/Time: 05/25/2023/7:13:55 PM    Final   TEE  ECHO TEE 07/12/2020  Narrative TRANSESOPHOGEAL ECHO REPORT    Patient Name:   Douglas [name] Date of Exam: 07/12/2020 Medical Rec #:  [id number]              Height:       67.0 in Accession #:    [id number]             Weight:       195.0 lb Date of Birth:  03-Feb-1946             BSA:          2.001 m Patient Age:    74 years               BP:           131/96 mmHg Patient Gender: M                      HR:           78 bpm. Exam Location:  Inpatient  Procedure: Cardiac Doppler, Color Doppler, Transesophageal Echo and Intracardiac Opacification Agent  Indications:    Atrial fibrillation  History:        Patient has prior history of Echocardiogram examinations, most recent 05/04/2018. CAD and Previous Myocardial Infarction, Arrythmias:Atrial Fibrillation; Risk Factors:Hypertension, Dyslipidemia and Sleep Apnea. H/O PCI.  Sonographer:  Ross Ludwig RDCS (AE) Referring Phys: [id number] Tanna Savoy [facility]  PROCEDURE: After discussion of the risks and benefits of a TEE, an informed consent was obtained from the patient. The transesophogeal probe was passed without difficulty through the esophogus of the patient. Sedation performed by different physician. The patient was monitored while under deep sedation. Anesthestetic sedation was provided intravenously by Anesthesiology: 303.2mg  of Propofol, 60mg  of Lidocaine. Image quality was good. The patient developed no complications during the procedure. A successful direct current cardioversion was performed at 200 joules with  1 attempt.  IMPRESSIONS   1. Left ventricular ejection fraction, by estimation, is 45 to 50%. The left ventricle has mildly decreased function. The left ventricle demonstrates regional wall motion abnormalities. Apical hypokinesis. 2. Right ventricular systolic function is normal. The right ventricular size is mildly enlarged. 3. The mitral valve is normal in structure. Trivial mitral valve regurgitation. 4. The aortic valve is tricuspid. Aortic valve regurgitation is not visualized. 5. Spontaneous echo contrast present, but no left atrial/left atrial appendage thrombus was detected. Definity was administered with showed filling of the appendage.  FINDINGS Left Ventricle: No transgastric views obtained given patient's history of hiatal hernia surgery. Left ventricular ejection fraction, by estimation, is 45 to 50%. The left ventricle has mildly decreased function. The left ventricle demonstrates regional wall motion abnormalities. Definity contrast agent was given IV to delineate the left ventricular endocardial borders. The left ventricular internal cavity size was normal in size.  Right Ventricle: The right ventricular size is mildly enlarged. No increase in right ventricular wall thickness. Right ventricular systolic function is normal.  Left Atrium: Left atrial size was mildly dilated. Spontaneous echo contrast was present. No left atrial/left atrial appendage thrombus was detected.  Right Atrium: Right atrial size was normal in size.  Pericardium: There is no evidence of pericardial effusion.  Mitral Valve: The mitral valve is normal in structure. Trivial mitral valve regurgitation.  Tricuspid Valve: The tricuspid valve is normal in structure. Tricuspid valve regurgitation is trivial.  Aortic Valve: The aortic valve is tricuspid. Aortic valve regurgitation is not visualized.  Pulmonic Valve: The pulmonic valve was grossly normal. Pulmonic valve regurgitation is not  visualized.  Aorta: The aortic root is normal in size and structure.  IAS/Shunts: No atrial level shunt detected by color flow Doppler.  Epifanio Lesches MD Electronically signed by Epifanio Lesches MD Signature Date/Time: 07/12/2020/3:32:10 PM    Final  MONITORS  CARDIAC EVENT MONITOR 04/28/2018  Narrative 30 Day Event Monitor  Quality: Fair.  Baseline artifact. Predominant rhythm: sinus rhythm Average heart rate: 60 bpm Max heart rate: 99 bpm Min heart rate: 47bpm  Occasional PACs and PVCs Up to 5 beats of NSVT  Tiffany C. Douglas Salvia, MD, [facility] 06/14/2018 9:42 AM       ______________________________________________________________________________________________      Risk Assessment/Calculations:    CHA2DS2-VASc Score = 6   This indicates a 9.7% annual risk of stroke. The patient's score is based upon: CHF History: 1 HTN History: 1 Diabetes History: 1 Stroke History: 0 Vascular Disease History: 1 Age Score: 2 Gender Score: 0            Physical Exam:   VS:  BP 128/76   Pulse 66   Ht 5\' 7"  (1.702 m)   Wt 196 lb 3.2 oz (89 kg)   SpO2 97%   BMI 30.73 kg/m    Wt Readings from Last 3 Encounters:  06/23/23 196 lb 3.2 oz (89 kg)  04/06/23 196 lb 9.6  oz (89.2 kg)  12/23/22 197 lb 6.4 oz (89.5 kg)    GEN: Well nourished, well developed in no acute distress NECK: No JVD; No carotid bruits CARDIAC: RRR, no murmurs, rubs, gallops RESPIRATORY:  Clear to auscultation without rales, wheezing or rhonchi  ABDOMEN: Soft, non-tender, non-distended EXTREMITIES:  No edema; No deformity   ASSESSMENT AND PLAN: .    Chronic combined systolic and diastolic heart failure- Echo 05/25/23 shows normal heart function with no wall motion abnormalities.Appears euvolic upon exam. GDMT- carvedilol 6.25 BID, Jardiance 10 mg, valsartan 160 mg, and lasix 40 mg PRN.    Ascending aortic aneurysm- Echo on 05/25/23 shows ascending aorta measuring 45 mm.     PAF/hypercoagulable state/high risk medication use - RRR by auscultation. Continue Eliquis 5 mg BID and tikosyn 500 mcg BID. He is having increased occurrence of palpitations not lasting longer than an hour. Kardia auto-read with atrial fibrillation but artifact makes rhythm strips difficult to interpret. He is having to slow down during daily activities. Agreeable to wearing Zio monitor and updating magnesium, TSH, CBC, and BMP. CHA2DS2-VASc Score = 6 [CHF History: 1, HTN History: 1, Diabetes History: 1, Stroke History: 0, Vascular Disease History: 1, Age Score: 2, Gender Score: 0].  Therefore, the patient's annual risk of stroke is 9.7 %.      OSA- Encouraged nightly CPAP.    CAD s/p CABG / HLD, LDL goal <70 - Stable with no anginal symptoms. No indication for ischemic evaluation.  03/2023 LDL 70. Continue GDMT Carvedilol 6.25mg  BID, Rosuvastatin 20mg  daily.. No aspirin due to [facility].   HTN - BP well controlled. Continue current antihypertensive regimen Valsartan 150mg  daily, Amlodipine 5mg  daily, Coreg 6.25mg  BID.         Dispo: Follow up in 6 months unless abnormal monitor results.   Signed, Alver Sorrow, NP

## 2023-06-24 LAB — CBC
Hematocrit: 57.5 % — ABNORMAL HIGH (ref 37.5–51.0)
Hemoglobin: 18.6 g/dL — ABNORMAL HIGH (ref 13.0–17.7)
MCH: 28.2 pg (ref 26.6–33.0)
MCHC: 32.3 g/dL (ref 31.5–35.7)
MCV: 87 fL (ref 79–97)
Platelets: 154 10*3/uL (ref 150–450)
RBC: 6.6 x10E6/uL — ABNORMAL HIGH (ref 4.14–5.80)
RDW: 17.6 % — ABNORMAL HIGH (ref 11.6–15.4)
WBC: 6.8 10*3/uL (ref 3.4–10.8)

## 2023-06-24 LAB — TSH: TSH: 2.19 u[IU]/mL (ref 0.450–4.500)

## 2023-06-24 LAB — BASIC METABOLIC PANEL
BUN/Creatinine Ratio: 17 (ref 10–24)
BUN: 18 mg/dL (ref 8–27)
CO2: 23 mmol/L (ref 20–29)
Calcium: 9.2 mg/dL (ref 8.6–10.2)
Chloride: 101 mmol/L (ref 96–106)
Creatinine, Ser: 1.03 mg/dL (ref 0.76–1.27)
Glucose: 86 mg/dL (ref 70–99)
Potassium: 4.7 mmol/L (ref 3.5–5.2)
Sodium: 138 mmol/L (ref 134–144)
eGFR: 74 mL/min/{1.73_m2} (ref >59)

## 2023-06-24 LAB — MAGNESIUM: Magnesium: 2.2 mg/dL (ref 1.6–2.3)

## 2023-06-25 ENCOUNTER — Encounter (HOSPITAL_BASED_OUTPATIENT_CLINIC_OR_DEPARTMENT_OTHER): Payer: Self-pay

## 2023-07-11 ENCOUNTER — Other Ambulatory Visit (HOSPITAL_COMMUNITY): Payer: Self-pay | Admitting: Physician Assistant

## 2023-07-15 ENCOUNTER — Telehealth: Payer: Self-pay | Admitting: Cardiology

## 2023-07-15 NOTE — Telephone Encounter (Signed)
 Abnormal results   Please advise.

## 2023-07-15 NOTE — Telephone Encounter (Signed)
 iR[employer] called about end of summary report. Reporting a 6.1 second pause on March 13th. Report viewable in Epic. Forwarding to provider.

## 2023-07-20 ENCOUNTER — Telehealth (HOSPITAL_BASED_OUTPATIENT_CLINIC_OR_DEPARTMENT_OTHER): Payer: Self-pay | Admitting: *Deleted

## 2023-07-20 NOTE — Telephone Encounter (Signed)
   Pre-operative Risk Assessment    Patient Name: Douglas [name]  DOB: 02/17/1946 MRN: [id number]   Date of last office visit: 06/23/2023 Ronn Melena NP  Date of next office visit: 09/20/2023 Dr Elberta Fortis   Request for Surgical Clearance    Procedure:  Dental Extraction - Amount of Teeth to be Pulled:  1  Date of Surgery:  Clearance 07/28/23                                Surgeon:  Dr Geryl Councilman  Surgeon's Group or Practice Name:   Phone number:  [phone] Fax number:  [phone]    Type of Clearance Requested:   - Pharmacy:  Hold Apixaban (Eliquis) WANT CARDIOLOGY TO DECIDE    Type of Anesthesia:  Not Indicated   Additional requests/questions:    Candace Gallus   07/20/2023, 2:59 PM

## 2023-07-21 NOTE — Telephone Encounter (Signed)
    Primary Cardiologist: Chilton Si, MD  Chart reviewed as part of pre-operative protocol coverage. Simple dental extractions are considered low risk procedures per guidelines and generally do not require any specific cardiac clearance. It is also generally accepted that for simple extractions and dental cleanings, there is no need to interrupt blood thinner therapy.   SBE prophylaxis is not required for the patient.  I will route this recommendation to the requesting party via Epic fax function and remove from pre-op pool.  Please call with questions.  Sharlene Dory, PA-C 07/21/2023, 7:46 AM

## 2023-09-06 NOTE — Progress Notes (Signed)
 HPI male former smoker followed for OSA, complicated by CAD/stent, HBP, GERD NPSG 01/18/13 AHI  22/hr, moderate OSA, weight  195 lbs  ---------------------------------------------------------------------------.   09/08/22- 78 year old male [place], former smoker followed for OSA, complicated by MI/CAD/stent, AFib, CHF,  HTN, GERD/ large HH, DM2,  CPAP auto 5-15/ [employer]    AirSense 10     replaced by adapt late 2023 Download -compliance 100%, AHI 1.2/ hr Body weight today 199 lbs                                       Wife here Download reviewed.  He says "cannot live without it".  Denies any problems with CPAP but finds it "aggravating".  We discussed.  He denies any problems with his breathing. [facility] a chest/aorta-11/08/21 IMPRESSION: 1. Ascending thoracic aortic aneurysm measuring 4.5 cm. Recommend semi-annual imaging followup by CTA or MRA and referral to cardiothoracic surgery if not already obtained. This recommendation follows 2010 ACCF/AHA/AATS/ACR/ASA/SCA/SCAI/SIR/STS/SVM Guidelines for the Diagnosis and Management of Patients With Thoracic Aortic Disease. Circulation. 2010; 121: N562-Z308. Aortic aneurysm NOS (ICD10-I71.9) 2. Mild bronchial wall thickening and air trapping. 3. Cholelithiasis without cholecystitis. 4. Moderate hiatal hernia, decreased from 2020. 5.  Aortic Atherosclerosis (ICD10-I70.0).  09/08/23- 78 year old male [place], former smoker followed for OSA, complicated by MI/CAD/stent, AFib, CHF,  HTN, GERD/ large HH, DM2, Ascending Aortic Aneurysm,  CPAP auto 5-15/ [employer]    AirSense 10     replaced by [employer] late 2023 Download -compliance 100%, AHI 0.6/hr Body weight today  193 lbs Discussed the use of AI scribe software for clinical note transcription with the patient, who gave verbal consent to proceed.  History of Present Illness   Press Casale "Brenita Callow" is a 78 year old male who presents for a follow-up regarding CPAP  use.  He uses a CPAP machine set between five and fifteen centimeters of water pressure, typically around twelve to thirteen centimeters, resulting in less than one breakthrough apnea per hour. He finds the CPAP comfortable and beneficial. His wife observes no snoring with CPAP use, but snoring occurs when he is without it. There are no changes in breathing or new respiratory symptoms. He says cardiac status is stable with no new concerns     Assessment and Plan:    Obstructive Sleep Apnea Obstructive sleep apnea well-managed with CPAP. Excellent apnea control, improved sleep quality, no new symptoms. - Continue current CPAP settings. - Instruct him to contact clinic for issues with home care company or insurance changes. - Advise contacting supply company when supplies needed.   CAD - follows with cardiology      ROS-see HPI   + = positive Constitutional:   No-   weight loss, night sweats, fevers, chills, fatigue, lassitude. HEENT:   No-  headaches, difficulty swallowing, tooth/dental problems, sore throat,       No-  sneezing, itching, ear ache, nasal congestion, post nasal drip,  CV:  No-   chest pain, orthopnea, PND, swelling in lower extremities, anasarca, dizziness, palpitations Resp: No-   shortness of breath with exertion or at rest.              No-   productive cough,  No non-productive cough,  No- coughing up of blood.              No-   change in color of mucus.  No- wheezing.   Skin: No-   rash or lesions. GI:  No-   heartburn, indigestion, abdominal pain, nausea, vomiting,  GU:  MS:  No-   joint pain or swelling.   Neuro-     nothing unusual Psych:  No- change in mood or affect. No depression or anxiety.  No memory loss.  OBJ- Physical Exam   stable baseline exam General- Alert, Oriented, Affect-appropriate, Distress- none acute. +Overweight Skin- rash-none, lesions- none, excoriation- none Lymphadenopathy- none Head- atraumatic            Eyes- Gross vision  intact, PERRLA, conjunctivae and secretions clear            Ears- + hearing aids            Nose- Clear, no-Septal dev, mucus, polyps, erosion, perforation             Throat- Mallampati II-III , mucosa clear , drainage- none, tonsils- atrophic Neck- flexible , trachea midline, no stridor , thyroid nl, carotid no bruit Chest - symmetrical excursion , unlabored           Heart/CV- RRR  with etxras , no murmur , no gallop  , no rub, nl s1 s2                            - JVD- none , edema- none, stasis changes- none, varices- none           Lung- clear to P&A, wheeze- none, cough- none , dullness-none, rub- none           Chest wall-  Abd-  Br/ Gen/ Rectal- Not done, not indicated Extrem- cyanosis- none, clubbing, none, atrophy- none, strength- nl Neuro- grossly intact to observation

## 2023-09-07 ENCOUNTER — Encounter (HOSPITAL_BASED_OUTPATIENT_CLINIC_OR_DEPARTMENT_OTHER): Payer: Self-pay

## 2023-09-07 NOTE — Telephone Encounter (Signed)
Responded in duplicate encounter.

## 2023-09-07 NOTE — Telephone Encounter (Signed)
 Please review monitor results and advise

## 2023-09-08 ENCOUNTER — Ambulatory Visit: Payer: Medicare Other | Admitting: Internal Medicine

## 2023-09-08 ENCOUNTER — Ambulatory Visit (HOSPITAL_BASED_OUTPATIENT_CLINIC_OR_DEPARTMENT_OTHER): Payer: Self-pay | Admitting: Family

## 2023-09-08 ENCOUNTER — Encounter: Payer: Self-pay | Admitting: Internal Medicine

## 2023-09-08 VITALS — BP 138/78 | HR 52 | Temp 97.6°F | Resp 18 | Ht 67.0 in | Wt 193.8 lb

## 2023-09-08 DIAGNOSIS — Z9981 Dependence on supplemental oxygen: Secondary | ICD-10-CM

## 2023-09-08 DIAGNOSIS — R002 Palpitations: Secondary | ICD-10-CM | POA: Diagnosis not present

## 2023-09-08 DIAGNOSIS — G4733 Obstructive sleep apnea (adult) (pediatric): Secondary | ICD-10-CM | POA: Diagnosis not present

## 2023-09-08 DIAGNOSIS — Z87891 Personal history of nicotine dependence: Secondary | ICD-10-CM | POA: Diagnosis not present

## 2023-09-08 DIAGNOSIS — I2510 Atherosclerotic heart disease of native coronary artery without angina pectoris: Secondary | ICD-10-CM

## 2023-09-08 MED ORDER — CARVEDILOL 3.125 MG PO TABS: 3.1250 mg | ORAL_TABLET | Freq: Two times a day (BID) | ORAL | 3 refills | Status: AC

## 2023-09-08 NOTE — Telephone Encounter (Signed)
 Monitor preliminary report reviewed.  Average heart rate of 61 bpm. Predominantly normal sinus rhythm. 26 runs of SVT and 1 run of VT which were asymptomatic. Atrial fibrillation occurring 5% of the time which was rate controlled. 56 pauses up to 6.1 seconds (longest pause at 11:22pm, second longest 4:49 am).  Will plan to reduce Carvedilol  to 3.125mg  BID. Will route to Dr. Lawana Pray for consideration of sooner appointment if needed.   Dorian Renfro S Grey Schlauch, NP

## 2023-09-08 NOTE — Patient Instructions (Signed)
 You are doing very well with CPAP- we can continue auto 5-15  Please call if we can help

## 2023-09-21 ENCOUNTER — Telehealth: Payer: Self-pay

## 2023-09-21 ENCOUNTER — Ambulatory Visit: Attending: Cardiology

## 2023-09-21 ENCOUNTER — Telehealth: Payer: Self-pay | Admitting: Cardiovascular Disease

## 2023-09-21 DIAGNOSIS — Z01810 Encounter for preprocedural cardiovascular examination: Secondary | ICD-10-CM | POA: Diagnosis not present

## 2023-09-21 NOTE — Telephone Encounter (Signed)
   Name: Douglas [name]  DOB: [birth year]  MRN: [id number]  Primary Cardiologist: Maudine Sos, MD  Preoperative team, please contact this patient and set up a phone call appointment for further preoperative risk assessment. Please obtain consent and complete medication review. Thank you for your help.  I confirm that guidance regarding antiplatelet and oral anticoagulation therapy has been completed and, if necessary, noted below.  Per office protocol, patient can hold Eliquis  for 2 days prior to procedure.   Patient will not need bridging with Lovenox  (enoxaparin ) around procedure.  I also confirmed the patient resides in the state of [place] . As per [facility] Medical Board telemedicine laws, the patient must reside in the state in which the provider is licensed.   Tayshaun Kroh D Quantavius Humm, NP 09/21/2023, 1:42 PM [facility]

## 2023-09-21 NOTE — Telephone Encounter (Signed)
 Med Rec and Consent done    Patient Consent for Virtual Visit        Douglas [name] has provided verbal consent on 09/21/2023 for a virtual visit (video or telephone).   CONSENT FOR VIRTUAL VISIT FOR:  Douglas [name]  By participating in this virtual visit I agree to the following:  I hereby voluntarily request, consent and authorize [facility] and its employed or contracted physicians, physician assistants, nurse practitioners or other licensed health care professionals (the Practitioner), to provide me with telemedicine health care services (the "Services") as deemed necessary by the treating Practitioner. I acknowledge and consent to receive the Services by the Practitioner via telemedicine. I understand that the telemedicine visit will involve communicating with the Practitioner through live audiovisual communication technology and the disclosure of certain medical information by electronic transmission. I acknowledge that I have been given the opportunity to request an in-person assessment or other available alternative prior to the telemedicine visit and am voluntarily participating in the telemedicine visit.  I understand that I have the right to withhold or withdraw my consent to the use of telemedicine in the course of my care at any time, without affecting my right to future care or treatment, and that the Practitioner or I may terminate the telemedicine visit at any time. I understand that I have the right to inspect all information obtained and/or recorded in the course of the telemedicine visit and may receive copies of available information for a reasonable fee.  I understand that some of the potential risks of receiving the Services via telemedicine include:  Delay or interruption in medical evaluation due to technological equipment failure or disruption; Information transmitted may not be sufficient (e.g. poor resolution of images) to allow for appropriate  medical decision making by the Practitioner; and/or  In rare instances, security protocols could fail, causing a breach of personal health information.  Furthermore, I acknowledge that it is my responsibility to provide information about my medical history, conditions and care that is complete and accurate to the best of my ability. I acknowledge that Practitioner's advice, recommendations, and/or decision may be based on factors not within their control, such as incomplete or inaccurate data provided by me or distortions of diagnostic images or specimens that may result from electronic transmissions. I understand that the practice of medicine is not an exact science and that Practitioner makes no warranties or guarantees regarding treatment outcomes. I acknowledge that a copy of this consent can be made available to me via my patient portal [facility] MyChart), or I can request a printed copy by calling the office of [facility].    I understand that my insurance will be billed for this visit.   I have read or had this consent read to me. I understand the contents of this consent, which adequately explains the benefits and risks of the Services being provided via telemedicine.  I have been provided ample opportunity to ask questions regarding this consent and the Services and have had my questions answered to my satisfaction. I give my informed consent for the services to be provided through the use of telemedicine in my medical care

## 2023-09-21 NOTE — Telephone Encounter (Signed)
 Patient with diagnosis of atrial fibrillation on Eliquis  for anticoagulation.    Procedure:  colonoscopy (retal bleeding)    Date of Surgery:  Clearance 09/24/23   CHA2DS2-VASc Score = 6   This indicates a 9.7% annual risk of stroke. The patient's score is based upon: CHF History: 1 HTN History: 1 Diabetes History: 1 Stroke History: 0 Vascular Disease History: 1 Age Score: 2 Gender Score: 0  CrCl 103 Platelet count 154  Patient does/does not require pre-op antibiotics.  Per office protocol, patient can hold Eliquis  for 2 days prior to procedure.   Patient will not need bridging with Lovenox  (enoxaparin ) around procedure.  **This guidance is not considered finalized until pre-operative APP has relayed final recommendations.**

## 2023-09-21 NOTE — Telephone Encounter (Signed)
 S/W pt and scheduled TELE Preop appt for 09/21/23 approved by KW.   Med Rec and Consent done

## 2023-09-21 NOTE — Progress Notes (Signed)
 Virtual Visit via Telephone Note   Because of Douglas [name] co-morbid illnesses, he is at least at moderate risk for complications without adequate follow up.  This format is felt to be most appropriate for this patient at this time.  Due to technical limitations with video connection (technology), today's appointment will be conducted as an audio only telehealth visit, and Kobyn Kray verbally agreed to proceed in this manner.   All issues noted in this document were discussed and addressed.  No physical exam could be performed with this format.  Evaluation Performed:  Preoperative cardiovascular risk assessment _____________   Date:  09/21/2023   Patient ID:  Douglas [name], DOB March 26, 1946, MRN [id number] Patient Location:  Home Provider location:   Office  Primary Care Provider:  Candiss Chamorro, MD Primary Cardiologist:  Maudine Sos, MD  Chief Complaint / Patient Profile   78 y.o. y/o male with a h/o CAD s/p MI and PCI to LAD in 2007, RCA in 2020, hiatal hernia repair in 03/2019, persistent atrial fibrillation, chronic combined systolic and diastolic heart failure, DM 2, hypertension, hyperlipidemia, chronic occult GI bleed, OSA on CPAP who is pending endoscopy/colonoscopy on 09/24/2023 and presents today for telephonic preoperative cardiovascular risk assessment.  History of Present Illness    Douglas [name] is a 78 y.o. male who presents via audio/video conferencing for a telehealth visit today.  Pt was last seen in cardiology clinic on 06/23/2023 by Neomi Banks, NP.  At that time Douglas [name] was doing well, he did note some low blood pressures as well as intermittent palpitations, he wore a cardiac monitor that was overall reassuring, it did indicate some asymptomatic pauses and his carvedilol  was reduced.  The patient is now pending procedure as outlined above. Since his last visit, he has remained stable from a cardiac  standpoint.  He is able to achieve greater than 4 METS of activity.  Today he denies chest pain, shortness of breath, lower extremity edema, fatigue, palpitations, melena, hematuria, hemoptysis, diaphoresis, weakness, presyncope, syncope, orthopnea, and PND.   Past Medical History    Past Medical History:  Diagnosis Date   Acute on chronic diastolic heart failure (HCC) 01/11/2020   Anemia    Arthritis    "joints pains; comes w/age" (05/09/2014)   Ascending aortic aneurysm (HCC) 10/31/2021   Chronic combined systolic and diastolic heart failure (HCC) 01/11/2020   Chronic combined systolic and diastolic heart failure (HCC) 01/11/2020   Coronary artery disease    Essential hypertension 01/11/2020   GERD (gastroesophageal reflux disease)    H/O hiatal hernia 1980   Heart attack (HCC) 10/29/2005   anteroseptal myocardial infaction Taxus stent to LAD   History of blood transfusion 1980?   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   Hyperlipidemia    Hypertension    Ischemic heart disease    Lower extremity edema 08/23/2015   OSA on CPAP    Pneumonia    S/P coronary artery stent placement 02/14/2021   Skin cancer    "burnt most of them off; face, arms"   Type 2 diabetes mellitus with obesity (HCC) 12/23/2022   Past Surgical History:  Procedure Laterality Date   ATRIAL FIBRILLATION ABLATION N/A 06/27/2022   Procedure: ATRIAL FIBRILLATION ABLATION;  Surgeon: Lei Pump, MD;  Location: [facility] INVASIVE CV LAB;  Service: Cardiovascular;  Laterality: N/A;  CARDIOVASCULAR STRESS TEST Left 05/28/2009   EF 57% no reversible ischemia   CARDIOVERSION N/A 07/12/2020   Procedure: CARDIOVERSION;  Surgeon: Wendie Hamburg, MD;  Location: [facility] ENDOSCOPY;  Service: Cardiovascular;  Laterality: N/A;   CATARACT EXTRACTION W/ INTRAOCULAR LENS IMPLANT Left 1997   CORONARY ANGIOPLASTY WITH STENT PLACEMENT  Bilateral 2007   "1"   CORONARY STENT INTERVENTION N/A 05/11/2018   Procedure: CORONARY STENT INTERVENTION;  Surgeon: Arleen Lacer, MD;  Location: [facility] INVASIVE CV LAB;  Service: Cardiovascular;  Laterality: N/A;   DOPPLER ECHOCARDIOGRAPHY  01/06/2007   ESOPHAGEAL MANOMETRY N/A 01/05/2019   Procedure: ESOPHAGEAL MANOMETRY (EM);  Surgeon: Jolinda Necessary, MD;  Location: [facility] ENDOSCOPY;  Service: Endoscopy;  Laterality: N/A;   EYE SURGERY Left 1978   "injury"   HERNIA REPAIR     INGUINAL HERNIA REPAIR Bilateral 05/09/2014   INGUINAL HERNIA REPAIR Bilateral 05/09/2014   Procedure: LAPAROSCOPIC BILATERAL INGUINAL HERNIA REPAIR;  Surgeon: Candyce Champagne, MD;  Location: [facility] OR;  Service: General;  Laterality: Bilateral;   INSERTION OF MESH N/A 05/09/2014   Procedure: INSERTION OF MESH;  Surgeon: Candyce Champagne, MD;  Location: [facility] OR;  Service: General;  Laterality: N/A;  INSERTION OF MESH TO ABDOMEN AND BILATERAL GROIN   LEFT HEART CATH AND CORONARY ANGIOGRAPHY N/A 05/11/2018   Procedure: LEFT HEART CATH AND CORONARY ANGIOGRAPHY;  Surgeon: Arleen Lacer, MD;  Location: [facility] INVASIVE CV LAB;  Service: Cardiovascular;  Laterality: N/A;   SHOULDER ARTHROSCOPY W/ ROTATOR CUFF REPAIR Right 06/2010   SKIN CANCER EXCISION Left    "arm"   TEE WITHOUT CARDIOVERSION N/A 07/12/2020   Procedure: TRANSESOPHAGEAL ECHOCARDIOGRAM (TEE);  Surgeon: Wendie Hamburg, MD;  Location: [facility] ENDOSCOPY;  Service: Cardiovascular;  Laterality: N/A;   TONSILLECTOMY  1964   UMBILICAL HERNIA REPAIR  05/09/2014   UMBILICAL HERNIA REPAIR N/A 05/09/2014   Procedure: HERNIA REPAIR UMBILICAL ADULT;  Surgeon: Candyce Champagne, MD;  Location: [facility] OR;  Service: General;  Laterality: N/A;   Allergies Allergies  Allergen Reactions   Ace Inhibitors Cough   Bee Venom     Other reaction(s): swelling   Lisinopril Cough   Losartan  Cough   Other Swelling   Home Medications    Prior to Admission medications   Medication Sig Start Date End Date  Taking? Authorizing Provider  acetaminophen  (TYLENOL ) 500 MG tablet Take 500-1,000 mg by mouth every 6 (six) hours as needed (for pain.).    [provider]  amLODipine  (NORVASC ) 5 MG tablet Take 1 tablet (5 mg total) by mouth daily. 03/31/23 06/29/23  Maudine Sos, MD  ampicillin (PRINCIPEN) 500 MG capsule Take 500 mg by mouth daily. 06/17/22   [provider]  apixaban  (ELIQUIS ) 5 MG TABS tablet Take 1 tablet (5 mg total) by mouth 2 (two) times daily. 07/05/20   Maudine Sos, MD  carvedilol  (COREG ) 3.125 MG tablet Take 1 tablet (3.125 mg total) by mouth 2 (two) times daily with a meal. 09/08/23   Walker, Caitlin S, NP  Cholecalciferol  (VITAMIN D ) 2000 UNITS tablet Take 2,000 Units by mouth daily.    [provider]  colchicine  0.6 MG tablet Take 0.6 mg by mouth 2 (two) times daily as needed (gout). Colcrys     [provider]  dofetilide  (TIKOSYN ) 500 MCG capsule TAKE ONE CAPSULE BY MOUTH TWICE DAILY 07/14/23   Camnitz, Babetta Lesch, MD  empagliflozin  (JARDIANCE ) 10 MG TABS tablet TAKE 1 TABLET BY MOUTH DAILY BEFORE BREAKFAST 11/03/22   Maudine Sos, MD  EPINEPHrine  0.3 mg/0.3 mL IJ SOAJ injection Inject 0.3 mg into the muscle as needed for allergies. 09/04/20   [provider]  fexofenadine (ALLEGRA) 180 MG tablet Take 180 mg by mouth daily as needed for allergies.    [provider]  furosemide  (LASIX ) 40 MG tablet Take 1 tablet (40 mg total) by mouth daily as needed. 12/23/22   Maudine Sos, MD  nitroGLYCERIN  (NITROSTAT ) 0.4 MG SL tablet Place 0.4 mg under the tongue every 5 (five) minutes as needed for chest pain.    [provider]  omeprazole (PRILOSEC) 20 MG capsule Take 20 mg by mouth daily. 03/06/20   [provider]  Polyethyl Glycol-Propyl Glycol (SYSTANE ULTRA) 0.4-0.3 % SOLN Place 1 drop into both eyes daily as needed (Dry eyes).    [provider]  rosuvastatin  (CRESTOR ) 20 MG tablet Take 1  tablet (20 mg total) by mouth daily. 12/23/22 03/23/23  Maudine Sos, MD  testosterone cypionate (DEPOTESTOSTERONE CYPIONATE) 200 MG/ML injection SMARTSIG:0.5-2 Milliliter(s) IM Every 2 Weeks PRN 11/01/21   [provider]  valACYclovir  (VALTREX ) 1000 MG tablet Take 1,000 mg by mouth 2 (two) times daily as needed (fever blisters).    [provider]  valsartan  (DIOVAN ) 160 MG tablet Take 1 tablet (160 mg total) by mouth daily. 12/23/22   Maudine Sos, MD    Physical Exam  Vital Signs:  Jameis Newsham does not have vital signs available for review today. Given telephonic nature of communication, physical exam is limited. AAOx3. NAD. Normal affect.  Speech and respirations are unlabored. Accessory Clinical Findings   None Assessment & Plan    1.  Preoperative Cardiovascular Risk Assessment: Colonoscopy   Mr. Meriweather's perioperative risk of a major cardiac event is 6.6% according to the Revised Cardiac Risk Index (RCRI).  Therefore, he is at low risk for perioperative complications.   His functional capacity is good at 6.36 METs according to the Duke Activity Status Index (DASI). Recommendations: According to ACC/AHA guidelines, no further cardiovascular testing needed.  The patient may proceed to surgery at acceptable risk.   Antiplatelet and/or Anticoagulation Recommendations: Per office protocol, patient can hold Eliquis  for 2 days prior to procedure.  Please resume when safe to do so from a bleeding standpoint. Patient will not need bridging with Lovenox  (enoxaparin ) around procedure.   The patient was advised that if he develops new symptoms prior to surgery to contact our office to arrange for a follow-up visit, and he verbalized understanding.  A copy of this note will be routed to requesting surgeon.  Time:   Today, I have spent 10 minutes with the patient with telehealth technology discussing medical history, symptoms, and management plan.     Derk Doubek D  Enda Santo, NP  09/21/2023, 5:14 PM

## 2023-09-21 NOTE — Telephone Encounter (Signed)
   Pre-operative Risk Assessment    Patient Name: Douglas [name]  DOB: 11/01/1945 MRN: [id number]   Date of last office visit: 07/20/23 Date of next office visit: 12/28/23   Request for Surgical Clearance    Procedure:  colonoscopy (retal bleeding)   Date of Surgery:  Clearance 09/24/23                                Surgeon:  Dr. Lavaughn [place] Surgeon's Group or Practice Name:  [facility] Phone number:  [phone] Fax number:  [phone]   Type of Clearance Requested:   - Medical  - Pharmacy:  Hold Apixaban  (Eliquis ) TBD   Type of Anesthesia:  MAC   Additional requests/questions:  Does this patient need antibiotics?    Signed, Caryn Clause   09/21/2023, 10:15 AM

## 2023-10-07 ENCOUNTER — Ambulatory Visit: Attending: Cardiology | Admitting: Cardiology

## 2023-10-07 ENCOUNTER — Encounter: Payer: Self-pay | Admitting: Cardiology

## 2023-10-07 VITALS — BP 135/80 | HR 52 | Ht 67.0 in | Wt 186.0 lb

## 2023-10-07 DIAGNOSIS — Z79899 Other long term (current) drug therapy: Secondary | ICD-10-CM

## 2023-10-07 DIAGNOSIS — I2510 Atherosclerotic heart disease of native coronary artery without angina pectoris: Secondary | ICD-10-CM

## 2023-10-07 DIAGNOSIS — I5022 Chronic systolic (congestive) heart failure: Secondary | ICD-10-CM | POA: Diagnosis not present

## 2023-10-07 DIAGNOSIS — D6869 Other thrombophilia: Secondary | ICD-10-CM | POA: Diagnosis not present

## 2023-10-07 DIAGNOSIS — G4733 Obstructive sleep apnea (adult) (pediatric): Secondary | ICD-10-CM

## 2023-10-07 DIAGNOSIS — I4819 Other persistent atrial fibrillation: Secondary | ICD-10-CM | POA: Diagnosis not present

## 2023-10-07 NOTE — Progress Notes (Signed)
 Electrophysiology Office Note:   Date:  10/07/2023  ID:  [name], [name] [birth year], MRN [id number]  Primary Cardiologist: Maudine Sos, MD Primary Heart Failure: None Electrophysiologist: Gerard Bonus Cortland Ding, MD      History of Present Illness:   Douglas [name] is a 78 y.o. male with h/o diastolic heart failure, ascending aortic aneurysm, coronary disease post LAD stenting CABG, hypertension, sleep apnea, atrial fibrillation/flutter seen today for routine electrophysiology followup.   Since last being seen in our clinic the patient reports doing overall well.  Despite this, he is continue to have episodes of atrial fibrillation.  Episodes last for a few hours at a time and occur on a weekly basis.  When he is in atrial fibrillation, he notes significant fatigue, shortness of breath.  When he is in normal rhythm, he has no acute complaints and is quite active.  he denies chest pain, palpitations, dyspnea, PND, orthopnea, nausea, vomiting, dizziness, syncope, edema, weight gain, or early satiety.   Review of systems complete and found to be negative unless listed in HPI.   EP Information / Studies Reviewed:    EKG is ordered today. Personal review as below.  EKG Interpretation Date/Time:  Wednesday October 07 2023 09:46:42 EDT Ventricular Rate:  52 PR Interval:  194 QRS Duration:  84 QT Interval:  428 QTC Calculation: 398 R Axis:   263  Text Interpretation: Sinus bradycardia Low voltage QRS consider Septal infarct (cited on or before 07-Oct-2023) When compared with ECG of 06-Apr-2023 10:11, No significant change since last tracing Confirmed by Darrol Brandenburg ([id number]) on 10/07/2023 9:52:19 AM     Risk Assessment/Calculations:    CHA2DS2-VASc Score = 6   This indicates a 9.7% annual risk of stroke. The patient's score is based upon: CHF History: 1 HTN History: 1 Diabetes History: 1 Stroke History: 0 Vascular Disease History: 1 Age Score: 2 Gender Score: 0             Physical Exam:   VS:  BP 135/80 (BP Location: Left Arm, Patient Position: Sitting, Cuff Size: Large)   Pulse (!) 52   Ht 5' 7 (1.702 m)   Wt 186 lb (84.4 kg)   SpO2 95%   BMI 29.13 kg/m    Wt Readings from Last 3 Encounters:  10/07/23 186 lb (84.4 kg)  09/08/23 193 lb 12.8 oz (87.9 kg)  06/23/23 196 lb 3.2 oz (89 kg)     GEN: Well nourished, well developed in no acute distress NECK: No JVD; No carotid bruits CARDIAC: Regular rate and rhythm, no murmurs, rubs, gallops RESPIRATORY:  Clear to auscultation without rales, wheezing or rhonchi  ABDOMEN: Soft, non-tender, non-distended EXTREMITIES:  No edema; No deformity   ASSESSMENT AND PLAN:    1.  Persistent atrial fibrillation/flutter: On dofetilide .  Post ablation 07/17/2022.  He is continue to have episodes of atrial fibrillation despite dofetilide .  He would prefer a continued rhythm control strategy.  His dofetilide  is not ultimately controlling his arrhythmia, we [name] plan for repeat ablation.  Risk, benefits, and alternatives to EP study and radiofrequency/pulse field ablation for afib were also discussed in detail today. These risks include but are not limited to stroke, bleeding, vascular damage, tamponade, perforation, damage to the esophagus, lungs, and other structures, pulmonary vein stenosis, worsening renal function, and death. The patient understands these risk and wishes to proceed.  We [name] therefore proceed with catheter ablation at the next available time.  Carto, ICE, anesthesia are requested for the procedure.  [name] also obtain CT PV protocol prior to the procedure to exclude LAA thrombus and further evaluate atrial anatomy.  2.  Secondary hypercoagulable state: On Eliquis   3.  High-risk medication monitoring: On dofetilide .  QTC remained stable.  4.  Chronic systolic heart failure: On medical therapy per primary cardiology  5.  Coronary disease: Post CABG.  No current chest pain  6.  Obstructive  sleep apnea: CPAP compliance encouraged  Follow up with Afib Clinic as usual post procedure  Signed, [name], MD

## 2023-10-07 NOTE — Patient Instructions (Signed)
 Medication Instructions:  Your physician recommends that you continue on your current medications as directed. Please refer to the Current Medication list given to you today.  *If you need a refill on your cardiac medications before your next appointment, please call your pharmacy*   Lab Work: Pre procedure labs -- we will call you to schedule:  BMP & CBC  If you have a lab test that is abnormal and we need to change your treatment, we will call you to review the results -- otherwise no news is good news.    Testing/Procedures: Your physician has requested that you have cardiac CT 3 weeks PRIOR to your ablation. Cardiac computed tomography (CT) is a painless test that uses an x-ray machine to take clear, detailed pictures of your heart. We will contact you if the result is abnormal. We will call you to schedule.  Your physician has recommended that you have an ablation. Catheter ablation is a medical procedure used to treat some cardiac arrhythmias (irregular heartbeats). During catheter ablation, a long, thin, flexible tube is put into a blood vessel in your groin (upper thigh), or neck. This tube is called an ablation catheter. It is then guided to your heart through the blood vessel. Radio frequency waves destroy small areas of heart tissue where abnormal heartbeats may cause an arrhythmia to start.   Your ablation is scheduled for ______ (will call you once October dates become available) .  We will call/send instructions at a later date.   Follow-Up: At [facility], you and your health needs are our priority.  As part of our continuing mission to provide you with exceptional heart care, we have created designated Provider Care Teams.  These Care Teams include your primary Cardiologist (physician) and Advanced Practice Providers (APPs -  Physician Assistants and Nurse Practitioners) who all work together to provide you with the care you need, when you need it.  Your next appointment:    1 month(s) after your ablation  The format for your next appointment:   In Person  Provider:   AFib clinic   Thank you for choosing [facility]!!   Reece Cane, RN [phone]    Other Instructions   Cardiac Ablation Cardiac ablation is a procedure to destroy (ablate) some heart tissue that is sending bad signals. These bad signals cause problems in heart rhythm. The heart has many areas that make these signals. If there are problems in these areas, they can make the heart beat in a way that is not normal. Destroying some tissues can help make the heart rhythm normal. Tell your doctor about: Any allergies you have. All medicines you are taking. These include vitamins, herbs, eye drops, creams, and over-the-counter medicines. Any problems you or family members have had with medicines that make you fall asleep (anesthetics). Any blood disorders you have. Any surgeries you have had. Any medical conditions you have, such as kidney failure. Whether you are pregnant or may be pregnant. What are the risks? This is a safe procedure. But problems may occur, including: Infection. Bruising and bleeding. Bleeding into the chest. Stroke or blood clots. Damage to nearby areas of your body. Allergies to medicines or dyes. The need for a pacemaker if the normal system is damaged. Failure of the procedure to treat the problem. What happens before the procedure? Medicines Ask your doctor about: Changing or stopping your normal medicines. This is important. Taking aspirin  and ibuprofen. Do not take these medicines unless your doctor tells you  to take them. Taking other medicines, vitamins, herbs, and supplements. General instructions Follow instructions from your doctor about what you cannot eat or drink. Plan to have someone take you home from the hospital or clinic. If you will be going home right after the procedure, plan to have someone with you for 24 hours. Ask your  doctor what steps will be taken to prevent infection. What happens during the procedure?  An IV tube will be put into one of your veins. You will be given a medicine to help you relax. The skin on your neck or groin will be numbed. A cut (incision) will be made in your neck or groin. A needle will be put through your cut and into a large vein. A tube (catheter) will be put into the needle. The tube will be moved to your heart. Dye may be put through the tube. This helps your doctor see your heart. Small devices (electrodes) on the tube will send out signals. A type of energy will be used to destroy some heart tissue. The tube will be taken out. Pressure will be held on your cut. This helps stop bleeding. A bandage will be put over your cut. The exact procedure may vary among doctors and hospitals. What happens after the procedure? You will be watched until you leave the hospital or clinic. This includes checking your heart rate, breathing rate, oxygen, and blood pressure. Your cut will be watched for bleeding. You will need to lie still for a few hours. Do not drive for 24 hours or as long as your doctor tells you. Summary Cardiac ablation is a procedure to destroy some heart tissue. This is done to treat heart rhythm problems. Tell your doctor about any medical conditions you may have. Tell him or her about all medicines you are taking to treat them. This is a safe procedure. But problems may occur. These include infection, bruising, bleeding, and damage to nearby areas of your body. Follow what your doctor tells you about food and drink. You may also be told to change or stop some of your medicines. After the procedure, do not drive for 24 hours or as long as your doctor tells you. This information is not intended to replace advice given to you by your health care provider. Make sure you discuss any questions you have with your health care provider. Document Revised: 06/28/2021 Document  Reviewed: 03/10/2019 Elsevier Patient Education  2023 [employer]   Cardiac Ablation, Care After  This sheet gives you information about how to care for yourself after your procedure. Your health care provider may also give you more specific instructions. If you have problems or questions, contact your health care provider. What can I expect after the procedure? After the procedure, it is common to have: Bruising around your puncture site. Tenderness around your puncture site. Skipped heartbeats. If you had an atrial fibrillation ablation, you may have atrial fibrillation during the first several months after your procedure.  Tiredness (fatigue).  Follow these instructions at home: Puncture site care  Follow instructions from your health care provider about how to take care of your puncture site. Make sure you: If present, leave stitches (sutures), skin glue, or adhesive strips in place. These skin closures may need to stay in place for up to 2 weeks. If adhesive strip edges start to loosen and curl up, you may trim the loose edges. Do not remove adhesive strips completely unless your health care provider tells you to do  that. If a large square bandage is present, this may be removed 24 hours after surgery.  Check your puncture site every day for signs of infection. Check for: Redness, swelling, or pain. Fluid or blood. If your puncture site starts to bleed, lie down on your back, apply firm pressure to the area, and contact your health care provider. Warmth. Pus or a bad smell. A pea or small marble sized lump at the site is normal and can take up to three months to resolve.  Driving Do not drive for at least 4 days after your procedure or however long your health care provider recommends. (Do not resume driving if you have previously been instructed not to drive for other health reasons.) Do not drive or use heavy machinery while taking prescription pain medicine. Activity Avoid  activities that take a lot of effort for at least 7 days after your procedure. Do not lift anything that is heavier than 5 lb (4.5 kg) for one week.  No sexual activity for 1 week.  Return to your normal activities as told by your health care provider. Ask your health care provider what activities are safe for you. General instructions Take over-the-counter and prescription medicines only as told by your health care provider. Do not use any products that contain nicotine or tobacco, such as cigarettes and e-cigarettes. If you need help quitting, ask your health care provider. You may shower after 24 hours, but Do not take baths, swim, or use a hot tub for 1 week.  Do not drink alcohol for 24 hours after your procedure. Keep all follow-up visits as told by your health care provider. This is important. Contact a health care provider if: You have redness, mild swelling, or pain around your puncture site. You have fluid or blood coming from your puncture site that stops after applying firm pressure to the area. Your puncture site feels warm to the touch. You have pus or a bad smell coming from your puncture site. You have a fever. You have chest pain or discomfort that spreads to your neck, jaw, or arm. You have chest pain that is worse with lying on your back or taking a deep breath. You are sweating a lot. You feel nauseous. You have a fast or irregular heartbeat. You have shortness of breath. You are dizzy or light-headed and feel the need to lie down. You have pain or numbness in the arm or leg closest to your puncture site. Get help right away if: Your puncture site suddenly swells. Your puncture site is bleeding and the bleeding does not stop after applying firm pressure to the area. These symptoms may represent a serious problem that is an emergency. Do not wait to see if the symptoms will go away. Get medical help right away. Call your local emergency services (911 in the [place] Do not  drive yourself to the hospital. Summary After the procedure, it is normal to have bruising and tenderness at the puncture site in your groin, neck, or forearm. Check your puncture site every day for signs of infection. Get help right away if your puncture site is bleeding and the bleeding does not stop after applying firm pressure to the area. This is a medical emergency. This information is not intended to replace advice given to you by your health care provider. Make sure you discuss any questions you have with your health care provider.

## 2023-10-22 ENCOUNTER — Telehealth: Payer: Self-pay | Admitting: Cardiology

## 2023-10-22 NOTE — Telephone Encounter (Signed)
 Pt spouse called in to sch ablation in Oct.

## 2023-10-22 NOTE — Telephone Encounter (Signed)
 Called pt to inform him that Dr. Inocencio October schedule is not available yet but he should expect a call in about 1-2 weeks to get that scheduled.

## 2023-10-27 ENCOUNTER — Other Ambulatory Visit: Payer: Self-pay

## 2023-10-27 MED ORDER — AMLODIPINE BESYLATE 5 MG PO TABS: 5.0000 mg | ORAL_TABLET | Freq: Every day | ORAL | 2 refills | Status: AC

## 2023-11-09 ENCOUNTER — Other Ambulatory Visit (HOSPITAL_BASED_OUTPATIENT_CLINIC_OR_DEPARTMENT_OTHER): Payer: Self-pay | Admitting: Cardiovascular Disease

## 2023-11-10 NOTE — Telephone Encounter (Signed)
 Refilled as requested

## 2023-11-24 ENCOUNTER — Telehealth: Payer: Self-pay | Admitting: Cardiology

## 2023-11-24 DIAGNOSIS — I4819 Other persistent atrial fibrillation: Secondary | ICD-10-CM

## 2023-11-24 NOTE — Telephone Encounter (Signed)
 Aware that schedule was just open  yesterday and we would be in touch soon to arrange.  She appreciates the update and looks forward to hearing from us .

## 2023-11-24 NOTE — Telephone Encounter (Signed)
 Spoke to patient's wife advised I will send message to Dr.Camnitz's RN.Advised she is in clinic today.

## 2023-11-24 NOTE — Telephone Encounter (Signed)
 Pt's wife is calling to see if the schedule is open for Dr Inocencio schedule for ablations in October

## 2023-11-25 NOTE — Telephone Encounter (Signed)
 Spoke with the patient's wife and scheduled patient for an ablation on 9/30 with Dr. Inocencio. She is aware of need for CT and lab work. We will be in touch soon with instructions.

## 2023-11-25 NOTE — Addendum Note (Signed)
 Addended by: CHAUVIGNE, Oneta Sigman on: 11/25/2023 08:26 AM   Modules accepted: Orders

## 2023-12-01 ENCOUNTER — Other Ambulatory Visit (HOSPITAL_BASED_OUTPATIENT_CLINIC_OR_DEPARTMENT_OTHER): Payer: Self-pay | Admitting: Cardiovascular Disease

## 2023-12-22 ENCOUNTER — Telehealth (HOSPITAL_COMMUNITY): Payer: Self-pay

## 2023-12-22 LAB — CBC

## 2023-12-22 NOTE — Telephone Encounter (Signed)
 Spoke with patient to complete pre-procedure call.     Health status review:  Any new medical conditions, recent signs of acute illness or been started on antibiotics? No Any recent hospitalizations or surgeries? No Any new medications started since pre-op visit? No  Follow all medication instructions prior to procedure or the procedure may be rescheduled:    Continue taking Eliquis  (Apixaban ) twice daily without missing any doses before procedure. Essential chronic medications:  No medication should be continued, unless told otherwise. On the morning of your procedure DO NOT take any medication., including Eliquis  (Apixaban ).  Nothing to eat or drink after midnight prior to your procedure.  Pre-procedure testing scheduled: CT on September 10 and lab work completed today, September 02.   Confirmed patient is scheduled for Atrial Fibrillation Ablation on Tuesday, September 30 with Dr. Soyla Norton. Instructed patient to arrive at the Main Entrance A at [facility]: [address] [place], [place] [address] and check in at Admitting at 5:30 AM.  Advised of plan to go home the same day and will only stay overnight if medically necessary. You MUST have a responsible adult to drive you home and MUST be with you the first 24 hours after you arrive home or your procedure could be cancelled.  Informed patient a nurse will call a day before the procedure to confirm arrival time and ensure instructions are followed.  Patient verbalized understanding to information provided and is agreeable to proceed with procedure.   Advised patient to contact RN Navigator at [phone], to inform of any new medications started after call or concerns prior to procedure.

## 2023-12-23 LAB — CBC
Hematocrit: 54.9 — AB (ref 37.5–51.0)
Hemoglobin: 17.2 g/dL (ref 13.0–17.7)
MCH: 26.6 pg (ref 26.6–33.0)
MCHC: 31.3 g/dL — AB (ref 31.5–35.7)
MCV: 85 fL (ref 79–97)
Platelets: 161 x10E3/uL (ref 150–450)
RBC: 6.46 x10E6/uL — AB (ref 4.14–5.80)
RDW: 17.4 — AB (ref 11.6–15.4)
WBC: 6 x10E3/uL (ref 3.4–10.8)

## 2023-12-23 LAB — BASIC METABOLIC PANEL WITH GFR
BUN/Creatinine Ratio: 15 (ref 10–24)
BUN: 17 mg/dL (ref 8–27)
CO2: 23 mmol/L (ref 20–29)
Calcium: 9.3 mg/dL (ref 8.6–10.2)
Chloride: 100 mmol/L (ref 96–106)
Creatinine, Ser: 1.11 mg/dL (ref 0.76–1.27)
Glucose: 95 mg/dL (ref 70–99)
Potassium: 5.1 mmol/L (ref 3.5–5.2)
Sodium: 137 mmol/L (ref 134–144)
eGFR: 68 mL/min/1.73 (ref >59)

## 2023-12-23 LAB — MAGNESIUM: Magnesium: 2.4 mg/dL — AB (ref 1.6–2.3)

## 2023-12-24 ENCOUNTER — Encounter: Payer: Self-pay | Admitting: *Deleted

## 2023-12-28 ENCOUNTER — Encounter (HOSPITAL_BASED_OUTPATIENT_CLINIC_OR_DEPARTMENT_OTHER): Payer: Self-pay | Admitting: Cardiovascular Disease

## 2023-12-28 ENCOUNTER — Telehealth: Payer: Self-pay | Admitting: Pharmacist Clinician (PhC)/ Clinical Pharmacy Specialist

## 2023-12-28 ENCOUNTER — Ambulatory Visit (HOSPITAL_BASED_OUTPATIENT_CLINIC_OR_DEPARTMENT_OTHER): Admitting: Cardiovascular Disease

## 2023-12-28 VITALS — BP 114/88 | HR 64 | Ht 67.0 in | Wt 192.0 lb

## 2023-12-28 DIAGNOSIS — I480 Paroxysmal atrial fibrillation: Secondary | ICD-10-CM

## 2023-12-28 DIAGNOSIS — I10 Essential (primary) hypertension: Secondary | ICD-10-CM

## 2023-12-28 DIAGNOSIS — I4729 Other ventricular tachycardia: Secondary | ICD-10-CM

## 2023-12-28 DIAGNOSIS — I7121 Aneurysm of the ascending aorta, without rupture: Secondary | ICD-10-CM

## 2023-12-28 DIAGNOSIS — E785 Hyperlipidemia, unspecified: Secondary | ICD-10-CM

## 2023-12-28 DIAGNOSIS — G4733 Obstructive sleep apnea (adult) (pediatric): Secondary | ICD-10-CM

## 2023-12-28 DIAGNOSIS — I4819 Other persistent atrial fibrillation: Secondary | ICD-10-CM

## 2023-12-28 DIAGNOSIS — I255 Ischemic cardiomyopathy: Secondary | ICD-10-CM | POA: Diagnosis not present

## 2023-12-28 NOTE — Progress Notes (Signed)
 Cardiology Office Note:  .   Date:  12/28/2023  ID:  Douglas [name], DOB [birth year], MRN [id number] PCP: Douglas Tanda Mae, MD  [facility] Providers Cardiologist:  Douglas Scarce, MD Electrophysiologist:  Douglas Gladis Norton, MD    History of Present Illness: .   Douglas [name] is a 78 y.o. male with CAD s/p MI and PCI (LAD 2007, RCA 2020), persistent AF, HFimpEF, hypertension, hyperlipidemia, ascending aorta aneurysm, chronic occult GI bleed, and OSA on CPAP who presents for follow up.  Douglas [name] was previously a patient of Dr. Dominick.  He had an anterior MI 10/2005.  He had nuclear stress tests 05/2009 and 10/2012 that were negative for ischemia. At previous appointments carvedilol  was reduced due to bradycardia.     Douglas [name] reported dizziness and his heart rate was noted to be low.  He was referred for an event monitor that showed 5 beats of NSVT.  He was referred for an echo 05/04/2018 that revealed LVEF 45 to 50% with hypokinesis of the apical myocardium.  He also had grade 1 diastolic dysfunction.  He then underwent left heart catheterization 04/2018 that revealed a 75% proximal RCA lesion.  A drug-eluting stent was placed.  His prior LAD stent was patent.  His hiatal hernia was repaired 03/2019.  Following that, his blood pressure was running low.  This was thought to be due to his 30 pound weight loss after hiatal hernia surgery.  However he was asymptomatic and did not want to lower his antihypertensives. His amlodipine  was subsequently reduced due to hypotension.    He developed atrial fibrillation 06/2020. He was started on Eliquis . He had a TEE cardioversion at which time his LVEF was 45-50% with apical hypokinesis. He followed up in atrial fibrillation clinic 07/2020 due to recurrent atrial fibrillation. He was started on Tikosyn  08/2020. He converted to sinus rhythm and did not require cardioversion.    He saw A. Fib clinic 07/2021 and was doing well. At his  visit 10/2021, he reported home blood pressures averaging 130's systolic. We added Jardiance  10 mg daily. He had a chest CTA 10/2021 showing ascending thoracic aortic aneurysm measuring 4.5 cm, slightly increased from prior. He was seen by Dr. Norton 01/2022 due to breakthrough episodes of Afib and atrial flutter. Subsequently underwent ablation 06/27/2022. Initially continued to have arrhythmic episodes post ablation but frequency seemed to be trending down. He followed up with Dr. Norton 09/2022 and was having only minimal episodes. He had an echo 04/2022 revealing LVEF 55-60%, mild to moderate mitral and tricuspid regurgitation. Ascending aortic aneurysm measured 46 mm.  At his visit 12/2022 blood pressures were labile ranging from the 110s to the 150s and averaging in the 130s.  Amlodipine  was switched to valsartan  due to swelling.  Amlodipine  was added back for additional blood pressure control.  Simvastatin  was also switched to rosuvastatin .  He continued to have episodes of atrial fibrillation.  He was last seen by EP 09/2023 and is scheduled to undergo repeat ablation 01/19/2024.    Discussed the use of AI scribe software for clinical note transcription with the patient, who gave verbal consent to proceed.  History of Present Illness Douglas [name] experiences ongoing episodes of atrial fibrillation despite being on Tikosyn . He uses a Kardia device to monitor his heart rhythm, which sometimes indicates atrial fibrillation but often shows normal sinus rhythm. He is currently taking Eliquis , carvedilol , and Tikosyn .  He has a history of high blood pressure, which was previously  elevated but has improved recently. He is on amlodipine , valsartan , and carvedilol . He reports occasional low blood pressure readings, such as 90s systolic, but no dizziness or lightheadedness. He attributes some low readings to being outside frequently, especially during gardening season.  He experiences occasional swelling in his  ankles, which he manages with Lasix  as needed. He notes that he has not needed to take Lasix  often recently.  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 Recently, he underwent a laser procedure that significantly improved his vision, allowing him to read road signs with his previously impaired eye.  He is frequently outdoors, particularly during gardening season, and enjoys spending time at [employer] under an umbrella.     ROS:  As per HPI  Studies Reviewed: .       Echo 05/25/23:  1. Left ventricular ejection fraction, by estimation, is 55 to 60%. The  left ventricle has normal function. The left ventricle demonstrates  regional wall motion abnormalities (see scoring diagram/findings for  description). There is moderate concentric  left ventricular hypertrophy. Left ventricular diastolic parameters are  indeterminate.   2. Right ventricular systolic function is normal. The right ventricular  size is mildly enlarged. Tricuspid regurgitation signal is inadequate for  assessing PA pressure.   3. Left atrial size was severely dilated.   4. The mitral valve is grossly normal. Trivial mitral valve  regurgitation. No evidence of mitral stenosis.   5. The aortic valve is tricuspid. Aortic valve regurgitation is mild. No  aortic stenosis is present.   6. Aneurysm of the ascending aorta, measuring 45 mm.   7. The inferior vena cava is normal in size with greater than 50%  respiratory variability, suggesting right atrial pressure of 3 mmHg.   Risk Assessment/Calculations:    CHA2DS2-VASc Score = 6   This indicates a 9.7% annual risk of stroke. The patient's score is based upon: CHF History: 1 HTN History: 1 Diabetes History: 1 Stroke History: 0 Vascular Disease History: 1 Age Score: 2 Gender Score: 0            Physical Exam:   VS:  BP 114/88   Pulse 64   Ht 5' 7 (1.702 m)   Wt 192 lb (87.1 kg)   BMI  30.07 kg/m  , BMI Body mass index is 30.07 kg/m. GENERAL:  Well appearing HEENT: Pupils equal round and reactive, fundi not visualized, oral mucosa unremarkable NECK:  No jugular venous distention, waveform within normal limits, carotid upstroke brisk and symmetric, no bruits, no thyromegaly LUNGS:  Clear to auscultation bilaterally HEART:  RRR.  PMI not displaced or sustained,S1 and S2 within normal limits, no S3, no S4, no clicks, no rubs, no murmurs ABD:  Flat, positive bowel sounds normal in frequency in pitch, no bruits, no rebound, no guarding, no midline pulsatile mass, no hepatomegaly, no splenomegaly EXT:  2 plus pulses throughout, trace ankle edema, no cyanosis no clubbing SKIN:  No rashes no nodules NEURO:  Cranial nerves II through XII grossly intact, motor grossly intact throughout PSYCH:  Cognitively intact, oriented to person place and time   ASSESSMENT AND PLAN: .    Assessment & Plan # Atrial fibrillation (paroxysmal and persistent), status post prior ablation, planned repeat ablation Intermittent AFib episodes despite Tikosyn . Cardiac device shows sinus rhythm with occasional irregularities. New ablation technique more effective. Low risk due to good health.  - Proceed with planned repeat  ablation using new technique. - Continue Eliquis , carvedilol , Tikosyn .  # Ischemic cardiomyopathy with heart failure w/ improved EF: #  Lower extremity edema: Lower extremity edema improved with Lasix . No frequent Lasix  needed. Occasional swelling with activity. - Monitor for edema symptoms and use Lasix  as needed. - Continue carvedilol , Jardiance  and valsartan   # Essential hypertension, well-controlled on current regimen Blood pressure improved. Occasional low readings without symptoms. Current regimen includes amlodipine , valsartan , carvedilol . Active lifestyle may cause low readings. - Continue current antihypertensive regimen.  Amlodipine , carvedilol  and valsartan  - Monitor  blood pressure and report dizziness or lightheadedness.  # CAD s/p PCI and CABG: # Hyperlipidemia, stable, monitoring labs Next [name] due in December. - Schedule fasting lab work for cholesterol [name] in December. - Continue rosuvastatin  and carvedilol   # Ascending aorta aneurysm:  4.5cm.  BP well-controlled. Stable for years on CT and echo.  Repeat imaging annually.     Dispo: f/u 6 months  Signed, Douglas Scarce, MD

## 2023-12-28 NOTE — Telephone Encounter (Signed)
-----   Message from Nurse Newell SQUIBB sent at 12/28/2023  9:42 AM EDT ----- Good morning and happy Monday   This patient was getting Jardiance  via grant, can you update this or do I send to PA team?  Melinda

## 2023-12-28 NOTE — Patient Instructions (Signed)
 Medication Instructions:  Your physician recommends that you continue on your current medications as directed. Please refer to the Current Medication list given to you today.   *If you need a refill on your cardiac medications before your next appointment, please call your pharmacy*  Lab Work: FASTING LP/CMET IN [place]  If you have labs (blood work) drawn today and your tests are completely normal, you will receive your results only by: MyChart Message (if you have MyChart) OR A paper copy in the mail If you have any lab test that is abnormal or we need to change your treatment, we will call you to review the results.  Testing/Procedures: NONE  Follow-Up: At [facility], you and your health needs are our priority.  As part of our continuing mission to provide you with exceptional heart care, our providers are all part of one team.  This team includes your primary Cardiologist (physician) and Advanced Practice Providers or APPs (Physician Assistants and Nurse Practitioners) who all work together to provide you with the care you need, when you need it.  Your next appointment:   6 month(s)  Provider:   Annabella Scarce, MD    We recommend signing up for the patient portal called MyChart.  Sign up information is provided on this After Visit Summary.  MyChart is used to connect with patients for Virtual Visits (Telemedicine).  Patients are able to view lab/test results, encounter notes, upcoming appointments, etc.  Non-urgent messages can be sent to your provider as well.   To learn more about what you can do with MyChart, go to [email or web address].   Other Instructions WILL REACH OUT TO KRISTIN A PHARM D ABOUT THE JARDIANCE  GRANT

## 2023-12-28 NOTE — Telephone Encounter (Signed)
 Mychart message sent to patient.

## 2023-12-28 NOTE — Telephone Encounter (Signed)
 Cardiomyopathy grant approved at [facility] for another year.  [employer] ID   [id number]  Card ID     [id number] BIN            [id number] PCN          [id number] GRP          [id number]  Good 01/19/24 to 01/17/25  (Note that his current grant is good until 01/18/24, this will activate the following day)

## 2023-12-29 NOTE — Telephone Encounter (Signed)
Patient viewed message in mychart

## 2023-12-30 ENCOUNTER — Ambulatory Visit (HOSPITAL_COMMUNITY)
Admission: RE | Admit: 2023-12-30 | Discharge: 2023-12-30 | Disposition: A | Discharge status: Home / Self Care | Source: Ambulatory Visit | Attending: Cardiovascular Disease | Admitting: Cardiovascular Disease

## 2023-12-30 DIAGNOSIS — I4819 Other persistent atrial fibrillation: Secondary | ICD-10-CM | POA: Diagnosis not present

## 2023-12-30 MED ORDER — IOHEXOL 350 MG/ML SOLN
100.0000 mL | Freq: Once | INTRAVENOUS | Status: AC | PRN
  Administered 2023-12-30: 100 mL via INTRAVENOUS

## 2024-01-18 NOTE — Pre-Procedure Instructions (Signed)
 Instructed patient on the following items: Arrival time 0630 Nothing to eat or drink after midnight No meds AM of procedure Responsible person to drive you home and stay with you for 24 hrs  Have you missed any doses of anti-coagulant Eliquis - takes twice a day, hasn't missed any doses in last 4 weeks.  Don't take dose morning of procedure.

## 2024-01-19 ENCOUNTER — Ambulatory Visit (HOSPITAL_COMMUNITY)
Admission: RE | Admit: 2024-01-19 | Discharge: 2024-01-19 | Disposition: A | Discharge status: Home / Self Care | Attending: Cardiology | Admitting: Cardiology

## 2024-01-19 ENCOUNTER — Other Ambulatory Visit: Payer: Self-pay

## 2024-01-19 ENCOUNTER — Encounter (HOSPITAL_COMMUNITY)
Admission: RE | Disposition: A | Discharge status: Home / Self Care | Payer: Self-pay | Source: Home / Self Care | Attending: Cardiology

## 2024-01-19 ENCOUNTER — Ambulatory Visit (HOSPITAL_COMMUNITY): Admitting: Certified Registered"

## 2024-01-19 ENCOUNTER — Encounter (HOSPITAL_COMMUNITY): Payer: Self-pay | Admitting: Cardiology

## 2024-01-19 DIAGNOSIS — Z951 Presence of aortocoronary bypass graft: Secondary | ICD-10-CM | POA: Diagnosis not present

## 2024-01-19 DIAGNOSIS — I5032 Chronic diastolic (congestive) heart failure: Secondary | ICD-10-CM | POA: Insufficient documentation

## 2024-01-19 DIAGNOSIS — Z87891 Personal history of nicotine dependence: Secondary | ICD-10-CM | POA: Diagnosis not present

## 2024-01-19 DIAGNOSIS — Z955 Presence of coronary angioplasty implant and graft: Secondary | ICD-10-CM | POA: Diagnosis not present

## 2024-01-19 DIAGNOSIS — I2510 Atherosclerotic heart disease of native coronary artery without angina pectoris: Secondary | ICD-10-CM

## 2024-01-19 DIAGNOSIS — I480 Paroxysmal atrial fibrillation: Secondary | ICD-10-CM

## 2024-01-19 DIAGNOSIS — I110 Hypertensive heart disease with heart failure: Secondary | ICD-10-CM | POA: Diagnosis not present

## 2024-01-19 DIAGNOSIS — I252 Old myocardial infarction: Secondary | ICD-10-CM | POA: Diagnosis not present

## 2024-01-19 DIAGNOSIS — Z7984 Long term (current) use of oral hypoglycemic drugs: Secondary | ICD-10-CM | POA: Insufficient documentation

## 2024-01-19 DIAGNOSIS — K219 Gastro-esophageal reflux disease without esophagitis: Secondary | ICD-10-CM | POA: Diagnosis not present

## 2024-01-19 DIAGNOSIS — G4730 Sleep apnea, unspecified: Secondary | ICD-10-CM | POA: Diagnosis not present

## 2024-01-19 DIAGNOSIS — I4819 Other persistent atrial fibrillation: Secondary | ICD-10-CM | POA: Insufficient documentation

## 2024-01-19 DIAGNOSIS — I4891 Unspecified atrial fibrillation: Secondary | ICD-10-CM | POA: Diagnosis not present

## 2024-01-19 DIAGNOSIS — E119 Type 2 diabetes mellitus without complications: Secondary | ICD-10-CM | POA: Insufficient documentation

## 2024-01-19 HISTORY — PX: ATRIAL FIBRILLATION ABLATION: EP1191

## 2024-01-19 LAB — GLUCOSE, CAPILLARY
Glucose-Capillary: 135 mg/dL — ABNORMAL HIGH (ref 70–99)
Glucose-Capillary: 79 mg/dL (ref 70–99)

## 2024-01-19 SURGERY — ATRIAL FIBRILLATION ABLATION
Anesthesia: General

## 2024-01-19 MED ORDER — PROPOFOL 500 MG/50ML IV EMUL
INTRAVENOUS | Status: DC | PRN
  Administered 2024-01-19: 90 ug/kg/min via INTRAVENOUS

## 2024-01-19 MED ORDER — ATROPINE SULFATE 1 MG/10ML IJ SOSY
PREFILLED_SYRINGE | INTRAMUSCULAR | Status: AC
  Filled 2024-01-19: qty 20

## 2024-01-19 MED ORDER — FENTANYL CITRATE (PF) 100 MCG/2ML IJ SOLN
INTRAMUSCULAR | Status: AC
  Filled 2024-01-19: qty 2

## 2024-01-19 MED ORDER — HEPARIN SODIUM (PORCINE) 1000 UNIT/ML IJ SOLN
INTRAMUSCULAR | Status: AC
  Filled 2024-01-19: qty 1

## 2024-01-19 MED ORDER — SUGAMMADEX SODIUM 200 MG/2ML IV SOLN
INTRAVENOUS | Status: DC | PRN
  Administered 2024-01-19: 170 mg via INTRAVENOUS

## 2024-01-19 MED ORDER — ATROPINE SULFATE 0.4 MG/ML IV SOLN
INTRAVENOUS | Status: DC | PRN
Start: 2024-01-19 — End: 2024-01-19
  Administered 2024-01-19: 1 mg via INTRAVENOUS

## 2024-01-19 MED ORDER — PHENYLEPHRINE HCL-NACL 20-0.9 MG/250ML-% IV SOLN
INTRAVENOUS | Status: DC | PRN
  Administered 2024-01-19: 40 ug/min via INTRAVENOUS
  Administered 2024-01-19: 3 ug/min via INTRAVENOUS

## 2024-01-19 MED ORDER — SODIUM CHLORIDE 0.9 % IV SOLN
INTRAVENOUS | Status: DC
  Administered 2024-01-19: 500 mL via INTRAVENOUS

## 2024-01-19 MED ORDER — SODIUM CHLORIDE 0.9% FLUSH: 3.0000 mL | Freq: Two times a day (BID) | INTRAVENOUS | Status: DC

## 2024-01-19 MED ORDER — ONDANSETRON HCL 4 MG/2ML IJ SOLN: 4.0000 mg | Freq: Four times a day (QID) | INTRAMUSCULAR | Status: DC | PRN

## 2024-01-19 MED ORDER — SODIUM CHLORIDE 0.9 % IV SOLN: 250.0000 mL | INTRAVENOUS | Status: DC | PRN

## 2024-01-19 MED ORDER — SODIUM CHLORIDE 0.9% FLUSH: 3.0000 mL | INTRAVENOUS | Status: DC | PRN

## 2024-01-19 MED ORDER — ONDANSETRON HCL 4 MG/2ML IJ SOLN
INTRAMUSCULAR | Status: DC | PRN
  Administered 2024-01-19: 4 mg via INTRAVENOUS

## 2024-01-19 MED ORDER — HEPARIN SODIUM (PORCINE) 1000 UNIT/ML IJ SOLN
INTRAMUSCULAR | Status: DC | PRN
  Administered 2024-01-19: 14000 [IU] via INTRAVENOUS

## 2024-01-19 MED ORDER — LIDOCAINE 2% (20 MG/ML) 5 ML SYRINGE
INTRAMUSCULAR | Status: DC | PRN
  Administered 2024-01-19: 60 mg via INTRAVENOUS

## 2024-01-19 MED ORDER — FENTANYL CITRATE (PF) 250 MCG/5ML IJ SOLN
INTRAMUSCULAR | Status: DC | PRN
  Administered 2024-01-19: 100 ug via INTRAVENOUS

## 2024-01-19 MED ORDER — ROCURONIUM BROMIDE 10 MG/ML (PF) SYRINGE
PREFILLED_SYRINGE | INTRAVENOUS | Status: DC | PRN
  Administered 2024-01-19: 10 mg via INTRAVENOUS
  Administered 2024-01-19: 60 mg via INTRAVENOUS

## 2024-01-19 MED ORDER — PROTAMINE SULFATE 10 MG/ML IV SOLN
INTRAVENOUS | Status: DC | PRN
Start: 2024-01-19 — End: 2024-01-19
  Administered 2024-01-19: 10 mg via INTRAVENOUS
  Administered 2024-01-19: 30 mg via INTRAVENOUS

## 2024-01-19 MED ORDER — ACETAMINOPHEN 325 MG PO TABS: 650.0000 mg | ORAL_TABLET | ORAL | Status: DC | PRN

## 2024-01-19 MED ORDER — PROPOFOL 10 MG/ML IV BOLUS
INTRAVENOUS | Status: DC | PRN
  Administered 2024-01-19 (×2): 20 mg via INTRAVENOUS
  Administered 2024-01-19: 130 mg via INTRAVENOUS

## 2024-01-19 MED ORDER — GLYCOPYRROLATE PF 0.2 MG/ML IJ SOSY
PREFILLED_SYRINGE | INTRAMUSCULAR | Status: DC | PRN
  Administered 2024-01-19: .2 mg via INTRAVENOUS

## 2024-01-19 SURGICAL SUPPLY — 19 items
CABLE FARASTAR GEN2 SNGL USE (CABLE) IMPLANT
CATH FARAWAVE 2.0 31 (CATHETERS) IMPLANT
CATH GE 8FR SOUNDSTAR (CATHETERS) IMPLANT
CATH OCTARAY 2.0 F 3-3-3-3-3 (CATHETERS) IMPLANT
CATH WEBSTER BI DIR CS D-F CRV (CATHETERS) IMPLANT
CLOSURE MYNX CONTROL 6F/7F (Vascular Products) IMPLANT
CLOSURE PERCLOSE PROSTYLE (Vascular Products) IMPLANT
COVER SWIFTLINK CONNECTOR (BAG) ×1 IMPLANT
DILATOR VESSEL 38 20CM 16FR (INTRODUCER) IMPLANT
GUIDEWIRE INQWIRE 1.5J.035X260 (WIRE) IMPLANT
KIT VERSACROSS CNCT FARADRIVE (KITS) IMPLANT
PACK EP LF (CUSTOM PROCEDURE TRAY) ×1 IMPLANT
PAD DEFIB RADIO PHYSIO CONN (PAD) ×1 IMPLANT
PATCH CARTO3 (PAD) IMPLANT
SHEATH FARADRIVE STEERABLE (SHEATH) IMPLANT
SHEATH PINNACLE 5F 10CM (SHEATH) IMPLANT
SHEATH PINNACLE 8F 10CM (SHEATH) IMPLANT
SHEATH PINNACLE VASC 9FR (SHEATH) IMPLANT
SHEATH PROBE COVER 6X72 (BAG) IMPLANT

## 2024-01-19 NOTE — Anesthesia Postprocedure Evaluation (Signed)
 Anesthesia Post Note  Patient: Douglas [name]  Procedure(s) Performed: ATRIAL FIBRILLATION ABLATION     Patient location during evaluation: PACU Anesthesia Type: General Level of consciousness: awake and alert Pain management: pain level controlled Vital Signs Assessment: post-procedure vital signs reviewed and stable Respiratory status: spontaneous breathing, nonlabored ventilation and respiratory function stable Cardiovascular status: blood pressure returned to baseline Postop Assessment: no apparent nausea or vomiting Anesthetic complications: no   No notable events documented.  Last Vitals:  Vitals:   01/19/24 1102 01/19/24 1105  BP: 90/60 (!) 89/60  Pulse: (!) 57 (!) 55  Resp: 17 16  Temp:    SpO2: 94% 93%    Last Pain:  Vitals:   01/19/24 0649  TempSrc: Oral                 Vertell Row

## 2024-01-19 NOTE — Anesthesia Procedure Notes (Signed)
 Procedure Name: Intubation Date/Time: 01/19/2024 8:50 AM  Performed by: Myrna Homer, CRNAPre-anesthesia Checklist: Patient identified, Emergency Drugs available, Suction available and Patient being monitored Patient Re-evaluated:Patient Re-evaluated prior to induction Oxygen Delivery Method: Circle System Utilized Preoxygenation: Pre-oxygenation with 100% oxygen Induction Type: IV induction Ventilation: Oral airway inserted - appropriate to patient size and Mask ventilation without difficulty Laryngoscope Size: Glidescope and 4 Grade View: Grade II Tube type: Oral Tube size: 7.5 mm Number of attempts: 1 Airway Equipment and Method: Stylet and Oral airway Placement Confirmation: ETT inserted through vocal cords under direct vision, positive ETCO2 and breath sounds checked- equal and bilateral Secured at: 23 cm Tube secured with: Tape Dental Injury: Teeth and Oropharynx as per pre-operative assessment

## 2024-01-19 NOTE — Anesthesia Preprocedure Evaluation (Addendum)
 Anesthesia Evaluation  Patient identified by MRN, date of birth, ID band Patient awake    Reviewed: Allergy & Precautions, NPO status , Patient's Chart, lab work & pertinent test results, reviewed documented beta blocker date and time   History of Anesthesia Complications Negative for: history of anesthetic complications  Airway Mallampati: III  TM Distance: >3 FB Neck ROM: Full    Dental  (+) Missing,    Pulmonary sleep apnea and Continuous Positive Airway Pressure Ventilation , former smoker   Pulmonary exam normal        Cardiovascular hypertension, Pt. on medications and Pt. on home beta blockers + CAD and + Past MI  Normal cardiovascular exam+ dysrhythmias Atrial Fibrillation   TTE 05/25/23: EF 55-60%, moderate LVH, mild RVE, severe LAE, mild AR, aneurysm of the ascending aorta measuring 45 mm    Neuro/Psych negative neurological ROS     GI/Hepatic Neg liver ROS, hiatal hernia,GERD  Medicated and Controlled,,  Endo/Other  diabetes, Type 2, Oral Hypoglycemic Agents    Renal/GU Renal disease     Musculoskeletal  (+) Arthritis ,    Abdominal   Peds  Hematology negative hematology ROS (+)   Anesthesia Other Findings   Reproductive/Obstetrics                              Anesthesia Physical Anesthesia Plan  ASA: 3  Anesthesia Plan: General   Post-op Pain Management: Minimal or no pain anticipated   Induction: Intravenous  PONV Risk Score and Plan: Treatment may vary due to age or medical condition, Ondansetron  and Dexamethasone   Airway Management Planned: Oral ETT  Additional Equipment: None  Intra-op Plan:   Post-operative Plan: Extubation in OR  Informed Consent: I have reviewed the patients History and Physical, chart, labs and discussed the procedure including the risks, benefits and alternatives for the proposed anesthesia with the patient or authorized representative who  has indicated his/her understanding and acceptance.     Dental advisory given  Plan Discussed with: CRNA  Anesthesia Plan Comments:          Anesthesia Quick Evaluation

## 2024-01-19 NOTE — Discharge Instructions (Signed)

## 2024-01-19 NOTE — Transfer of Care (Signed)
 Immediate Anesthesia Transfer of Care Note  Patient: Douglas [name]  Procedure(s) Performed: ATRIAL FIBRILLATION ABLATION  Patient Location: PACU and Cath Lab  Anesthesia Type:General  Level of Consciousness: drowsy, patient cooperative, and responds to stimulation  Airway & Oxygen Therapy: Patient Spontanous Breathing and Patient connected to face mask oxygen  Post-op Assessment: Report given to RN and Post -op Vital signs reviewed and stable  Post vital signs: Reviewed and stable  Last Vitals:  Vitals Value Taken Time  BP 91/55 01/19/24 10:15  Temp    Pulse 59 01/19/24 10:15  Resp 14 01/19/24 10:15  SpO2 98 % 01/19/24 10:15  Vitals shown include unfiled device data.  Last Pain:  Vitals:   01/19/24 0649  TempSrc: Oral         Complications: No notable events documented.

## 2024-01-19 NOTE — Progress Notes (Signed)
 Patient became hypotensive. PA Charlies Arthur paged, with response to bed side. Fluid bolus (500 mls) and phenylephine 3 mcg/ min started. Patient remained alert and oriented to person, place, time, and events with skin pink warm and dry.

## 2024-01-19 NOTE — Progress Notes (Signed)
 Up and walked and tolerated well; bilat groins stable, no bleeding or hematoma

## 2024-01-19 NOTE — H&P (Signed)
  Electrophysiology Office Note:   Date:  01/19/2024  ID:  [name], [name] Sep 13, 1945, MRN [id number]  Primary Cardiologist: Annabella Scarce, MD Primary Heart Failure: None Electrophysiologist: Kelbie Moro Gladis Norton, MD      History of Present Illness:   Douglas [name] is a 78 y.o. male with h/o diastolic heart failure, ascending aortic aneurysm, coronary disease post LAD stenting CABG, hypertension, sleep apnea, atrial fibrillation/flutter seen today for routine electrophysiology followup.   Today, denies symptoms of palpitations, chest pain, dyspnea, orthopnea, PND, lower extremity edema, claudication, dizziness, presyncope, syncope, bleeding, or neurologic sequela. The patient is tolerating medications without difficulties. Plan ablation today.   EP Information / Studies Reviewed:    EKG is ordered today. Personal review as below.        Risk Assessment/Calculations:    CHA2DS2-VASc Score = 6   This indicates a 9.7% annual risk of stroke. The patient's score is based upon: CHF History: 1 HTN History: 1 Diabetes History: 1 Stroke History: 0 Vascular Disease History: 1 Age Score: 2 Gender Score: 0            Physical Exam:   VS:  BP (!) 143/83   Pulse (!) 59   Temp 97.7 F (36.5 C) (Oral)   Resp 17   Ht 5' 7 (1.702 m)   Wt 83.9 kg   SpO2 97%   BMI 28.98 kg/m    Wt Readings from Last 3 Encounters:  01/19/24 83.9 kg  12/28/23 87.1 kg  10/07/23 84.4 kg    GEN: Well nourished, well developed in no acute distress NECK: No JVD; No carotid bruits CARDIAC: Regular rate and rhythm, no murmurs, rubs, gallops RESPIRATORY:  Clear to auscultation without rales, wheezing or rhonchi  ABDOMEN: Soft, non-tender, non-distended EXTREMITIES:  No edema; No deformity    ASSESSMENT AND PLAN:    1.  Persistent atrial fibrillation/flutter: Douglas [name] has presented today for surgery, with the diagnosis of AF.  The various methods of treatment have  been discussed with the patient and family. After consideration of risks, benefits and other options for treatment, the patient has consented to  Procedure(s): Catheter ablation as a surgical intervention .  Risks include but not limited to complete heart block, stroke, esophageal damage, nerve damage, bleeding, vascular damage, tamponade, perforation, MI, and death. The patient's history has been reviewed, patient examined, no change in status, stable for surgery.  I have reviewed the patient's chart and labs.  Questions were answered to the patient's satisfaction.    [name], MD 01/19/2024 7:23 AM

## 2024-01-20 ENCOUNTER — Telehealth (HOSPITAL_COMMUNITY): Payer: Self-pay

## 2024-01-20 NOTE — Telephone Encounter (Signed)
 Spoke with patient to complete post procedure follow up call.  Patient reports no complications with groin sites.   Instructions reviewed with patient:  Remove large bandage at puncture site after 24 hours. It is normal to have bruising, tenderness, mild swelling, and a pea or marble sized lump/knot at the groin site which can take up to three months to resolve.  Get help right away if you notice sudden swelling at the puncture site.  Check your puncture site every day for signs of infection: fever, redness, swelling, pus drainage, warmth, foul odor or excessive pain. If this occurs, please call [phone], to speak with the RN Navigator. Get help right away if your puncture site is bleeding and the bleeding does not stop after applying firm pressure to the area.  You may continue to have skipped beats/ atrial fibrillation during the first several months after your procedure.  It is very important not to miss any doses of your blood thinner Eliquis .    You will follow up with the Afib clinic 4 weeks after your procedure and follow up with the Afib clinic 3 months after your procedure.   Patient verbalized understanding to all instructions provided.

## 2024-01-21 LAB — POCT ACTIVATED CLOTTING TIME: Activated Clotting Time: 325 s

## 2024-02-01 ENCOUNTER — Telehealth: Payer: Self-pay | Admitting: *Deleted

## 2024-02-01 DIAGNOSIS — G4733 Obstructive sleep apnea (adult) (pediatric): Secondary | ICD-10-CM

## 2024-02-01 NOTE — Telephone Encounter (Signed)
 Order placed patient informed.NFN

## 2024-02-01 NOTE — Telephone Encounter (Signed)
 Pt must use [facility] for cpap supplies per letter from insurance.

## 2024-02-01 NOTE — Telephone Encounter (Signed)
 PT saw Dr.Young in May 2025. PT States she was told she would be getting a CPAP. PT has not be contacted for it yet. PT stats [facility] does not have any orders for her. Can orders be sent to [facility] on her behalf.

## 2024-02-01 NOTE — Telephone Encounter (Signed)
 LMTCB x1  Copied from CRM [id number]. Topic: Clinical - Order For Equipment >> Jan 27, 2024 12:05 PM Ismael A wrote: Reason for CRM: patient's wife Gaetana stated CPAP prescription orders are not on file for patient at [facility], please follow up - pt wife # [phone]

## 2024-02-01 NOTE — Telephone Encounter (Signed)
 Patient needs an order for a CPAP

## 2024-02-01 NOTE — Telephone Encounter (Signed)
 My understanding was that [place] was managing CPAP. Apparently we are. Orders need to go through [place].  [facility] please replace old CPAP machine, auto 5-15, mask of choice, humidifier, supplies, AirView/ card

## 2024-02-16 ENCOUNTER — Ambulatory Visit (HOSPITAL_COMMUNITY)
Admission: RE | Admit: 2024-02-16 | Discharge: 2024-02-16 | Disposition: A | Discharge status: Home / Self Care | Source: Ambulatory Visit | Attending: Physician Assistant | Admitting: Physician Assistant

## 2024-02-16 VITALS — BP 114/76 | HR 62 | Ht 67.0 in | Wt 191.2 lb

## 2024-02-16 DIAGNOSIS — Z5181 Encounter for therapeutic drug level monitoring: Secondary | ICD-10-CM | POA: Diagnosis not present

## 2024-02-16 DIAGNOSIS — I4819 Other persistent atrial fibrillation: Secondary | ICD-10-CM | POA: Diagnosis not present

## 2024-02-16 DIAGNOSIS — Z79899 Other long term (current) drug therapy: Secondary | ICD-10-CM

## 2024-02-16 DIAGNOSIS — I4891 Unspecified atrial fibrillation: Secondary | ICD-10-CM

## 2024-02-16 DIAGNOSIS — D6869 Other thrombophilia: Secondary | ICD-10-CM | POA: Diagnosis not present

## 2024-02-16 NOTE — Progress Notes (Signed)
 Primary Care Physician: Loring Tanda Mae, MD Referring Physician: ER f/u  Cardiologist: Douglas [name] Primary EP: Dr Inocencio [name] Douglas [name] is a 78 y.o. male with a h/o CAD and presented to the ER 04/18/18, with 2 hours of palpitations and found to be in new onset afib, rate controlled. He self converted in the ER. Since he was already on asa/plavix  for CAD, he was not started on anticoagulation. He was referred to the afib clinic for further evaluation. He was already on BB. He did not drink alcohol, no caffeine, does have OSA and uses CPAP religiously. Walks for exercise.  He wore a heart monitor that showed no afib.  He is presumed to have had Covid in February after wife had mild symptoms and tested positive  His PCR was negative, but tested  early in the course of his symptoms. At that  time he developed afib. He saw Douglas [name] and was started on anticoagulation for 3 weeks and cardioverted. He had a successful cardioversion but had ERAF. He was referred back to the afib clinic.   We discussed today means of restoring SR. His two options are amiodarone or Tikosyn . Flecainide contraindicated due to CAD.  After full discussion on risk vrs benefit of both drugs, he has decided to schedule a Tikosyn  admit. Qt acceptable, not does appear to have any contraindicated drugs. Rare benadryl  use. So far, he is tolerating Afib. He worked in the yard yesterday for many hours and felt ok with this activity. He is rate controlled in the office.   F/u in afib clinic, 08/29/18, for Tikosyn  admit. No missed anticoagulation. No benadryl  use. [name] He is currently tolerating rate controlled afib ok, not so much when he first developed afib. QT interval  is good at 417 ms.  He will use his insurance to get his med initially, but then plans to get it through the [facility].  F/u in afib clinic, 5/19,  for recent loading of tikosyn .  He converted on drug and did not require CV. He is in SR today but has had  frequent breakthrough episodes of afib since start of tikosyn . He has a Optician, [employer] to confirm. He usually is not aware of the start of Afib but knows when he goes back in rhythm as he  feels lightheaded for a few seconds.   F/u in afib clinic, 01/14/21. He remains on tikosyn . He is in sinus brady at 55 bpm. He has been staying in rhythm, no afib appreciated. Qt stable. He has been at the beach for the last week and his weight is up 12 lbs. He takes lasix  prn. He did take it yesterday. He has mild ankle swelling. No dyspnea. Compliant with Tikosyn  and eliquis  for a CHA2DS2VASc score of 4.   F/u in afib clinic, 04/10/21. He  is out of rhythm this am. Pt was in rhythm earlier this am per [place], reviewed. He will occasionally with show afib for a very short period of time, not symptomatic, rates controlled.complaint with Tikosyn .   F/u in afib clinic, 08/08/21 for Tikosyn  surveillance. He has been staying in SR.  He reports a nose bleed late winter that required packing and cautery but has not reoccurred. Compliant with Tikosyn .   F/u in afib clinic, 02/04/22 for Tikosyn  surveillance. Ekg shows atrial flutter at 94 bpm. Qtc 472 ms. He reports that he having breakthroughafib/flutter mostly for a period of time evey day. He is compliant with tiksoyn. He saw Dr. Kelsie last  in 2022 and ablation was discussed but at that point tikosyn  was working well for the pt so he deferred. He is interested to see EP and discuss again.   Follow up in the AF clinic 07/25/22. Patient is s/p afib and flutter ablation with Dr Inocencio on 06/27/22. Patient reports that he had almost daily episodes of afib for the first 2-3 weeks post ablation but has has very little over the past several days. He denies any chest pain, swallowing pain, or groin issues.   Follow up 02/16/24. Patient returns for follow up for atrial fibrillation. He is s/p repeat afib ablation on 01/19/24. He is in SR today and feels well. He denies any interim symptoms of  afib. He denies chest pain or groin issues. No bleeding issues on anticoagulation.   Today, he  denies symptoms of palpitations, chest pain, shortness of breath, orthopnea, PND, lower extremity edema, dizziness, presyncope, syncope, bleeding, or neurologic sequela. The patient is tolerating medications without difficulties and is otherwise without complaint today.    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 (Patient taking differently: Take 500-1,000 mg by mouth as needed (for pain.).)     amLODipine  (NORVASC ) 5 MG tablet Take 1 tablet (5 mg total) by mouth daily. 90 tablet 2   ampicillin (PRINCIPEN) 500 MG capsule Take 500 mg by mouth 2 (two) times daily.     apixaban  (ELIQUIS ) 5 MG TABS tablet Take 1 tablet (5 mg total) by mouth 2 (two) times  daily. 60 tablet 5   carvedilol  (COREG ) 3.125 MG tablet Take 1 tablet (3.125 mg total) by mouth 2 (two) times daily with a meal. 180 tablet 3   Cholecalciferol  (VITAMIN D ) 2000 UNITS tablet Take 2,000 Units by mouth daily.     colchicine  0.6 MG tablet Take 0.6 mg by mouth 2 (two) times daily as needed (gout). Colcrys      dofetilide  (TIKOSYN ) 500 MCG capsule TAKE ONE CAPSULE BY MOUTH TWICE DAILY 180 capsule 2   empagliflozin  (JARDIANCE ) 10 MG TABS tablet TAKE 1 TABLET BY MOUTH DAILY BEFORE BREAKFAST 90 tablet 3   EPINEPHrine  0.3 mg/0.3 mL IJ SOAJ injection Inject 0.3 mg into the muscle as needed for allergies.  fexofenadine (ALLEGRA) 180 MG tablet Take 180 mg by mouth daily as needed for allergies. (Patient taking differently: Take 180 mg by mouth as needed for allergies.)     furosemide  (LASIX ) 40 MG tablet Take 1 tablet (40 mg total) by mouth daily as needed. (Patient taking differently: Take 40 mg by mouth as needed.) 45 tablet 3   nitroGLYCERIN  (NITROSTAT ) 0.4 MG SL tablet Place 0.4 mg under the tongue every 5 (five) minutes as needed for chest pain.     omeprazole (PRILOSEC) 20 MG capsule Take 20 mg by mouth daily.     Polyethyl Glycol-Propyl Glycol (SYSTANE ULTRA) 0.4-0.3 % SOLN Place 1 drop into both eyes daily as needed (Dry eyes).     rosuvastatin  (CRESTOR ) 20 MG tablet Take 20 mg by mouth daily.     testosterone cypionate (DEPOTESTOSTERONE CYPIONATE) 200 MG/ML injection Inject 100 mg into the muscle every 14 (fourteen) days.     valACYclovir  (VALTREX ) 1000 MG tablet Take 1,000 mg by mouth 2 (two) times daily as needed (fever blisters).     valsartan  (DIOVAN ) 160 MG tablet TAKE 1 TABLET BY MOUTH DAILY 90 tablet 3   No current facility-administered medications for this encounter.    ROS- All systems are reviewed and negative except as per the HPI above  Physical Exam: Vitals:   02/16/24 1422  BP: 114/76  Pulse: 62  Weight: 86.7 kg  Height: 5' 7 (1.702 m)    Wt Readings from  Last 3 Encounters:  02/16/24 86.7 kg  01/19/24 83.9 kg  12/28/23 87.1 kg    GEN: Well nourished, well developed in no acute distress CARDIAC: Regular rate and rhythm, no murmurs, rubs, gallops RESPIRATORY:  Clear to auscultation without rales, wheezing or rhonchi  ABDOMEN: Soft, non-tender, non-distended EXTREMITIES:  No edema; No deformity    EKG today demonstrates SR, LAFB Vent. rate 62 BPM PR interval 196 ms QRS duration 84 ms QT/QTcB 458/464 ms   Echo 05/25/23  1. Left ventricular ejection fraction, by estimation, is 55 to 60%. The  left ventricle has normal function. The left ventricle demonstrates  regional wall motion abnormalities (see scoring diagram/findings for  description). There is moderate concentric left ventricular hypertrophy. Left ventricular diastolic parameters are indeterminate.   2. Right ventricular systolic function is normal. The right ventricular  size is mildly enlarged. Tricuspid regurgitation signal is inadequate for  assessing PA pressure.   3. Left atrial size was severely dilated.   4. The mitral valve is grossly normal. Trivial mitral valve  regurgitation. No evidence of mitral stenosis.   5. The aortic valve is tricuspid. Aortic valve regurgitation is mild. No  aortic stenosis is present.   6. Aneurysm of the ascending aorta, measuring 45 mm.   7. The inferior vena cava is normal in size with greater than 50%  respiratory variability, suggesting right atrial pressure of 3 mmHg.   Comparison(s): Changes from prior study are noted. Apical septum and  apical WMA are more apparent on the current study. Ascending aorta remains  stable.   Epic records reviewed   CHA2DS2-VASc Score = 6  The patient's score is based upon: CHF History: 1 HTN History: 1 Diabetes History: 1 Stroke History: 0 Vascular Disease History: 1 Age Score: 2 Gender Score: 0       ASSESSMENT AND PLAN: Persistent Atrial Fibrillation/atrial flutter (ICD10:   I48.19) The patient's CHA2DS2-VASc score is 6, indicating a 9.7% annual risk of stroke.   S/p dofetilide  loading 08/2020 S/p afib and  flutter ablation 06/27/22, repeat afib ablation 01/19/24. Patient appears to be maintaining SR Continue dofetilide  500 mcg BID Continue carvedilol  3.125 mg BID Continue Eliquis  5 mg BID with no missed doses for 3 months post ablation.   Secondary Hypercoagulable State (ICD10:  D68.69) The patient is at significant risk for stroke/thromboembolism based upon his CHA2DS2-VASc Score of 6.  Continue Apixaban  (Eliquis ). No bleeding issues.   High Risk Medication Monitoring (ICD 10: U5195107) Patient requires ongoing monitoring for anti-arrhythmic medication which has the potential to cause life threatening arrhythmias. QT interval on ECG acceptable for dofetilide  monitoring. Recent bmet/mag reviewed.   OSA  Encouraged nightly CPAP  CAD S/p CABG No anginal symptoms Followed by Dr [name]  Chronic HFpEF EF 55-60% GDMT per primary cardiology team Fluid status appears stable today  HTN Stable on current regimen   Follow up in the AF clinic in 2 months.     Daril Kicks PA-C [facility] [address] [place], [place] [address] [phone]

## 2024-02-23 ENCOUNTER — Telehealth: Payer: Self-pay | Admitting: *Deleted

## 2024-02-23 DIAGNOSIS — G4733 Obstructive sleep apnea (adult) (pediatric): Secondary | ICD-10-CM

## 2024-02-23 NOTE — Telephone Encounter (Signed)
 Can someone f/u on what [employer] needs in order for patient to get cpap supplies. A new order was just placed last month.   Copied from CRM #[id number]. Topic: Clinical - Order For Equipment >> Feb 23, 2024  4:24 PM Leila C wrote: Reason for CRM: Patient's wife Gaetana [phone] states [employer] [phone] and fax # [phone] needs a prescription and office notes with Dr. Saundra signature before patient can have cpap supplies. Please advise and call back.

## 2024-02-24 NOTE — Telephone Encounter (Signed)
 They are trying to reach pt to get scheduled. Called wife and told her who she needed to call and who to ask for she verbalized understanding. NFN

## 2024-02-24 NOTE — Telephone Encounter (Signed)
 New order placed through [facility] as requested.   Clinical - Order For Equipment >> Feb 24, 2024 12:28 PM Douglas [name] wrote: Reason for CRM: pt's wife Douglas [name] states pt's insurance is requesting that his CPAP supplies be ordered through [employer], they do not cover [employer]

## 2024-02-24 NOTE — Telephone Encounter (Signed)
 Urgent message sent to [employer]

## 2024-03-22 ENCOUNTER — Other Ambulatory Visit: Payer: Self-pay | Admitting: Cardiology

## 2024-03-28 LAB — COMPREHENSIVE METABOLIC PANEL WITH GFR
ALT: 14 IU/L (ref 0–44)
AST: 17 IU/L (ref 0–40)
Albumin: 4.1 g/dL (ref 3.8–4.8)
Alkaline Phosphatase: 52 IU/L (ref 47–123)
BUN/Creatinine Ratio: 20 (ref 10–24)
BUN: 19 mg/dL (ref 8–27)
Bilirubin Total: 0.5 mg/dL (ref 0.0–1.2)
CO2: 22 mmol/L (ref 20–29)
Calcium: 9 mg/dL (ref 8.6–10.2)
Chloride: 105 mmol/L (ref 96–106)
Creatinine, Ser: 0.97 mg/dL (ref 0.76–1.27)
Globulin, Total: 2 g/dL (ref 1.5–4.5)
Glucose: 92 mg/dL (ref 70–99)
Potassium: 4.6 mmol/L (ref 3.5–5.2)
Sodium: 139 mmol/L (ref 134–144)
Total Protein: 6.1 g/dL (ref 6.0–8.5)
eGFR: 80 mL/min/1.73 (ref >59)

## 2024-03-28 LAB — LIPID PANEL
Chol/HDL Ratio: 3.9 ratio (ref 0.0–5.0)
Cholesterol, Total: 135 mg/dL (ref 100–199)
HDL: 35 mg/dL — ABNORMAL LOW (ref >39)
LDL Chol Calc (NIH): 85 mg/dL (ref 0–99)
Triglycerides: 72 mg/dL (ref 0–149)
VLDL Cholesterol Cal: 15 mg/dL (ref 5–40)

## 2024-04-18 ENCOUNTER — Ambulatory Visit (HOSPITAL_COMMUNITY)
Admission: RE | Admit: 2024-04-18 | Discharge: 2024-04-18 | Disposition: A | Discharge status: Home / Self Care | Source: Ambulatory Visit | Attending: Physician Assistant | Admitting: Physician Assistant

## 2024-04-18 VITALS — BP 138/82 | HR 71 | Wt 192.6 lb

## 2024-04-18 DIAGNOSIS — Z5181 Encounter for therapeutic drug level monitoring: Secondary | ICD-10-CM

## 2024-04-18 DIAGNOSIS — I4819 Other persistent atrial fibrillation: Secondary | ICD-10-CM | POA: Diagnosis not present

## 2024-04-18 DIAGNOSIS — Z79899 Other long term (current) drug therapy: Secondary | ICD-10-CM

## 2024-04-18 DIAGNOSIS — I480 Paroxysmal atrial fibrillation: Secondary | ICD-10-CM

## 2024-04-18 DIAGNOSIS — D6869 Other thrombophilia: Secondary | ICD-10-CM

## 2024-04-18 NOTE — Patient Instructions (Signed)
Follow up with Dr Elberta Fortis in 6 months

## 2024-04-18 NOTE — Progress Notes (Signed)
 "  Primary Care Physician: Douglas Tanda Mae, MD Referring Physician: ER f/u  Cardiologist: Dr. 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 male with a h/o CAD and presented to the ER 04/18/18, with 2 hours of palpitations and found to be in new onset afib, rate controlled. He self converted in the ER. Since he was already on asa/plavix  for CAD, he was not started on anticoagulation. He was referred to the afib clinic for further evaluation. He was already on BB. He did not drink alcohol, no caffeine, does have OSA and uses CPAP religiously. Walks for exercise.  He wore a heart monitor that showed no afib.  He is presumed to have had Covid in February after wife had mild symptoms and tested positive  His PCR was negative, but tested  early in the course of his symptoms. At that  time he developed afib. He saw Dr. Raford and was started on anticoagulation for 3 weeks and cardioverted. He had a successful cardioversion but had ERAF. He was referred back to the afib clinic.   We discussed today means of restoring SR. His two options are amiodarone or Tikosyn . Flecainide contraindicated due to CAD.  After full discussion on risk vrs benefit of both drugs, he has decided to schedule a Tikosyn  admit. Qt acceptable, not does appear to have any contraindicated drugs. Rare benadryl  use. So far, he is tolerating Afib. He worked in the yard yesterday for many hours and felt ok with this activity. He is rate controlled in the office.   F/u in afib clinic, 08/29/18, for Tikosyn  admit. No missed anticoagulation. No benadryl  use. [name] He is currently tolerating rate controlled afib ok, not so much when he first developed afib. QT interval  is good at 417 ms.  He will use his insurance to get his med initially, but then plans to get it through the [facility].  F/u in afib clinic, 5/19,  for recent loading of tikosyn .  He converted on drug and did not require CV. He is in SR today but has had  frequent breakthrough episodes of afib since start of tikosyn . He has a Optician, [employer] to confirm. He usually is not aware of the start of Afib but knows when he goes back in rhythm as he  feels lightheaded for a few seconds.   F/u in afib clinic, 01/14/21. He remains on tikosyn . He is in sinus brady at 55 bpm. He has been staying in rhythm, no afib appreciated. Qt stable. He has been at the beach for the last week and his weight is up 12 lbs. He takes lasix  prn. He did take it yesterday. He has mild ankle swelling. No dyspnea. Compliant with Tikosyn  and eliquis  for a CHA2DS2VASc score of 4.   F/u in afib clinic, 04/10/21. He  is out of rhythm this am. Pt was in rhythm earlier this am per Douglas [name], reviewed. He will occasionally with show afib for a very short period of time, not symptomatic, rates controlled.complaint with Tikosyn .   F/u in afib clinic, 08/08/21 for Tikosyn  surveillance. He has been staying in SR.  He reports a nose bleed late winter that required packing and cautery but has not reoccurred. Compliant with Tikosyn .   F/u in afib clinic, 02/04/22 for Tikosyn  surveillance. Ekg shows atrial flutter at 94 bpm. Qtc 472 ms. He reports that he having breakthroughafib/flutter mostly for a period of time evey day. He is compliant with tiksoyn. He saw Dr. Kelsie  last in 2022 and ablation was discussed but at that point tikosyn  was working well for the pt so he deferred. He is interested to see EP and discuss again.   Follow up in the AF clinic 07/25/22. Patient is s/p afib and flutter ablation with Dr Douglas on 06/27/22. Patient reports that he had almost daily episodes of afib for the first 2-3 weeks post ablation but has has very little over the past several days. He denies any chest pain, swallowing pain, or groin issues.   Follow up 02/16/24. Patient returns for follow up for atrial fibrillation. He is s/p repeat afib ablation on 01/19/24. He is in SR today and feels well. He denies any interim symptoms of  afib. He denies chest pain or groin issues. No bleeding issues on anticoagulation.   Follow up 04/18/24. Patient returns for follow up for atrial fibrillation and dofetilide  monitoring. He remains in SR today and feels well. He denies any interim symptoms of afib since his ablation. No bleeding issues on anticoagulation.   Today, he  denies symptoms of palpitations, chest pain, shortness of breath, orthopnea, PND, lower extremity edema, dizziness, presyncope, syncope, snoring, daytime somnolence, bleeding, or neurologic sequela. The patient is tolerating medications without difficulties and is otherwise without complaint today.    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 90 tablet 2   ampicillin (PRINCIPEN) 500 MG capsule Take 500 mg by mouth 2 (two) times daily.     apixaban   (ELIQUIS ) 5 MG TABS tablet Take 1 tablet (5 mg total) by mouth 2 (two) times daily. 60 tablet 5   carvedilol  (COREG ) 3.125 MG tablet Take 1 tablet (3.125 mg total) by mouth 2 (two) times daily with a meal. 180 tablet 3   Cholecalciferol  (VITAMIN D ) 2000 UNITS tablet Take 2,000 Units by mouth daily.     colchicine  0.6 MG tablet Take 0.6 mg by mouth 2 (two) times daily as needed (gout). Colcrys      dofetilide  (TIKOSYN ) 500 MCG capsule TAKE ONE CAPSULE BY MOUTH TWICE DAILY 180 capsule 1   empagliflozin  (JARDIANCE ) 10 MG TABS tablet TAKE 1 TABLET BY MOUTH DAILY BEFORE BREAKFAST 90 tablet 3   EPINEPHrine  0.3 mg/0.3 mL IJ SOAJ injection Inject 0.3 mg into the  muscle as needed for allergies.     nitroGLYCERIN  (NITROSTAT ) 0.4 MG SL tablet Place 0.4 mg under the tongue every 5 (five) minutes as needed for chest pain.     omeprazole (PRILOSEC) 20 MG capsule Take 20 mg by mouth daily.     Polyethyl Glycol-Propyl Glycol (SYSTANE ULTRA) 0.4-0.3 % SOLN Place 1 drop into both eyes daily as needed (Dry eyes).     rosuvastatin  (CRESTOR ) 20 MG tablet Take 20 mg by mouth daily. (Patient taking differently: Take 40 mg by mouth daily.)     testosterone cypionate (DEPOTESTOSTERONE CYPIONATE) 200 MG/ML injection Inject 100 mg into the muscle every 14 (fourteen) days.     valACYclovir  (VALTREX ) 1000 MG tablet Take 1,000 mg by mouth 2 (two) times daily as needed (fever blisters).     valsartan  (DIOVAN ) 160 MG tablet TAKE 1 TABLET BY MOUTH DAILY 90 tablet 3   acetaminophen  (TYLENOL ) 500 MG tablet Take 500-1,000 mg by mouth every 6 (six) hours as needed (for pain.). (Patient taking differently: Take 500-1,000 mg by mouth as needed (for pain.).)     fexofenadine (ALLEGRA) 180 MG tablet Take 180 mg by mouth daily as needed for allergies. (Patient taking differently: Take 180 mg by mouth as needed for allergies.)     furosemide  (LASIX ) 40 MG tablet Take 1 tablet (40 mg total) by mouth daily as needed. (Patient taking differently:  Take 40 mg by mouth as needed.) 45 tablet 3   No current facility-administered medications for this encounter.    ROS- All systems are reviewed and negative except as per the HPI above  Physical Exam: Vitals:   04/18/24 1452  BP: 138/82  Pulse: 71  Weight: 87.4 kg    Wt Readings from Last 3 Encounters:  04/18/24 87.4 kg  02/16/24 86.7 kg  01/19/24 83.9 kg    GEN: Well nourished, well developed in no acute distress CARDIAC: Regular rate and rhythm, no murmurs, rubs, gallops RESPIRATORY:  Clear to auscultation without rales, wheezing or rhonchi  ABDOMEN: Soft, non-tender, non-distended EXTREMITIES:  No edema; No deformity    EKG Interpretation Date/Time:  Monday April 18 2024 14:49:10 EST Ventricular Rate:  71 PR Interval:  190 QRS Duration:  82 QT Interval:  416 QTC Calculation: 452 R Axis:   168  Text Interpretation: Normal sinus rhythm Septal infarct (cited on or before 18-Apr-2024) Abnormal ECG When compared with ECG of 16-Feb-2024 14:30, No significant change was found Confirmed by Douglas [name] ([id number]) on 04/18/2024 2:58:15 PM    Echo 05/25/23  1. Left ventricular ejection fraction, by estimation, is 55 to 60%. The  left ventricle has normal function. The left ventricle demonstrates  regional wall motion abnormalities (see scoring diagram/findings for  description). There is moderate concentric left ventricular hypertrophy. Left ventricular diastolic parameters are indeterminate.   2. Right ventricular systolic function is normal. The right ventricular  size is mildly enlarged. Tricuspid regurgitation signal is inadequate for  assessing PA pressure.   3. Left atrial size was severely dilated.   4. The mitral valve is grossly normal. Trivial mitral valve  regurgitation. No evidence of mitral stenosis.   5. The aortic valve is tricuspid. Aortic valve regurgitation is mild. No  aortic stenosis is present.   6. Aneurysm of the ascending aorta, measuring 45 mm.    7. The inferior vena cava is normal in size with greater than 50%  respiratory variability, suggesting right atrial pressure of 3 mmHg.   Comparison(s): Changes from prior study are  noted. Apical septum and  apical WMA are more apparent on the current study. Ascending aorta remains  stable.   Epic records reviewed   CHA2DS2-VASc Score = 6  The patient's score is based upon: CHF History: 1 HTN History: 1 Diabetes History: 1 Stroke History: 0 Vascular Disease History: 1 Age Score: 2 Gender Score: 0       ASSESSMENT AND PLAN: Persistent Atrial Fibrillation/atrial flutter (ICD10:  I48.19) The patient's CHA2DS2-VASc score is 6, indicating a 9.7% annual risk of stroke.   S/p dofetilide  loading 08/2020 S/p afib and flutter ablation 06/27/22 and afib ablation 01/19/24. Patient appears to be maintaining SR Continue dofetilide  500 mcg BID. Can consider discontinuing if he continues without afib post ablation.  Continue carvedilol  3.125 mg BID Continue Eliquis  5 mg BID  Secondary Hypercoagulable State (ICD10:  D68.69) The patient is at significant risk for stroke/thromboembolism based upon his CHA2DS2-VASc Score of 6.  Continue Apixaban  (Eliquis ). No bleeding issues.   High Risk Medication Monitoring (ICD 10: J342684) Patient requires ongoing monitoring for anti-arrhythmic medication which has the potential to cause life threatening arrhythmias. QT interval on ECG acceptable for dofetilide  monitoring.   OSA  Encouraged nightly CPAP  CAD S/p CABG No anginal symptoms Followed by Dr Douglas [name]  Chronic HFpEF EF 55-60% GDMT per primary cardiology team Fluid status appears stable today  HTN Stable on current regimen   Follow up with Dr Douglas in 6 months.     Douglas Kicks PA-C [facility] [address] [place], [place] [address] [phone] "

## 2024-04-18 NOTE — Progress Notes (Signed)
 HPI male former smoker followed for OSA, complicated by CAD/stent, HBP, GERD NPSG 01/18/13 AHI  22/hr, moderate OSA, weight  195 lbs  ---------------------------------------------------------------------------.   09/08/23- 78 year old male Veteran, former smoker followed for OSA, complicated by MI/CAD/stent, AFib, CHF,  HTN, GERD/ large HH, DM2, Ascending Aortic Aneurysm,  CPAP auto 5-15/ [employer]    AirSense 10     replaced by [employer] late 2023 Download -compliance 100%, AHI 0.6/hr Body weight today  193 lbs Discussed the use of AI scribe software for clinical note transcription with the patient, who gave verbal consent to proceed.  History of Present Illness   Douglas [name] is a 78 year old male who presents for a follow-up regarding CPAP use.  He uses a CPAP machine set between five and fifteen centimeters of water pressure, typically around twelve to thirteen centimeters, resulting in less than one breakthrough apnea per hour. He finds the CPAP comfortable and beneficial. His wife observes no snoring with CPAP use, but snoring occurs when he is without it. There are no changes in breathing or new respiratory symptoms. He says cardiac status is stable with no new concerns     Assessment and Plan:    Obstructive Sleep Apnea Obstructive sleep apnea well-managed with CPAP. Excellent apnea control, improved sleep quality, no new symptoms. - Continue current CPAP settings. - Instruct him to contact clinic for issues with home care company or insurance changes. - Advise contacting supply company when supplies needed.   CAD - follows with cardiology    04/19/24- 78 year old male Veteran, former smoker followed for OSA, complicated by MI/CAD/stent, AFib, CHF,  HTN, GERD/ large HH, DM2, Ascending Aortic Aneurysm,  CPAP auto 5-15/ [employer]    AirSense 10     replaced by [employer] / [employer] late 2025 Download -compliance -100%. AHI 0.8/hr Body weight today  -193 lbs  Discussed the  use of AI scribe software for clinical note transcription with the patient, who gave verbal consent to proceed.  History of Present Illness   Douglas [name] is a 78 year old male who presents for follow-up after receiving a new CPAP machine.  He uses the new CPAP every night without machine issues but reports the current mask is too large and needs replacement.  A recent insurance change to Douglas [name] has made ordering CPAP supplies difficult and delayed by about two months, and he now needs to reorder supplies under the new coverage.  His CPAP machine was replaced earlier this year due to malfunction, and he recalls another replacement around October, with no current problems reported with the device.     Assessment and Plan:    Obstructive sleep apnea- benefits from CPAP Well-controlled with CPAP therapy. Less than one breakthrough apnea per hour. Requires new mask due to size change and insurance issues. - Contact [employer] for a new CPAP mask. - Schedule follow-up with sleep medicine specialist next year.     ROS-see HPI   + = positive Constitutional:   No-   weight loss, night sweats, fevers, chills, fatigue, lassitude. HEENT:   No-  headaches, difficulty swallowing, tooth/dental problems, sore throat,       No-  sneezing, itching, ear ache, nasal congestion, post nasal drip,  CV:  No-   chest pain, orthopnea, PND, swelling in lower extremities, anasarca, dizziness, palpitations Resp: No-   shortness of breath with exertion or at rest.              No-  productive cough,  No non-productive cough,  No- coughing up of blood.              No-   change in color of mucus.  No- wheezing.   Skin: No-   rash or lesions. GI:  No-   heartburn, indigestion, abdominal pain, nausea, vomiting,  GU:  MS:  No-   joint pain or swelling.   Neuro-     nothing unusual Psych:  No- change in mood or affect. No depression or anxiety.  No memory loss.  OBJ- Physical Exam   stable baseline  exam General- Alert, Oriented, Affect-appropriate, Distress- none acute. +Overweight Skin- rash-none, lesions- none, excoriation- none Lymphadenopathy- none Head- atraumatic            Eyes- Gross vision intact, PERRLA, conjunctivae and secretions clear            Ears- + hearing aids            Nose- Clear, no-Septal dev, mucus, polyps, erosion, perforation             Throat- Mallampati II-III , mucosa clear , drainage- none, tonsils- atrophic Neck- flexible , trachea midline, no stridor , thyroid nl, carotid no bruit Chest - symmetrical excursion , unlabored           Heart/CV- RRR  with etxras , no murmur , no gallop  , no rub, nl s1 s2                            - JVD- none , edema- none, stasis changes- none, varices- none           Lung- clear to P&A, wheeze- none, cough- none , dullness-none, rub- none           Chest wall-  Abd-  Br/ Gen/ Rectal- Not done, not indicated Extrem- cyanosis- none, clubbing, none, atrophy- none, strength- nl Neuro- grossly intact to observation

## 2024-04-19 ENCOUNTER — Encounter: Payer: Self-pay | Admitting: Internal Medicine

## 2024-04-19 ENCOUNTER — Ambulatory Visit: Admitting: Internal Medicine

## 2024-04-19 VITALS — BP 132/80 | HR 60 | Ht 67.0 in | Wt 193.0 lb

## 2024-04-19 DIAGNOSIS — Z87891 Personal history of nicotine dependence: Secondary | ICD-10-CM | POA: Diagnosis not present

## 2024-04-19 DIAGNOSIS — G4733 Obstructive sleep apnea (adult) (pediatric): Secondary | ICD-10-CM | POA: Diagnosis not present

## 2024-04-19 NOTE — Patient Instructions (Signed)
 We can continue CPAP auto 5-15, mask of choice, humidifier, supplies, airView/ card   At check out, ask the front desk to bring you back next year with one of our sleep doctors.

## 2024-04-21 ENCOUNTER — Ambulatory Visit: Payer: Self-pay | Admitting: Cardiovascular Disease

## 2024-04-21 DIAGNOSIS — I10 Essential (primary) hypertension: Secondary | ICD-10-CM

## 2024-04-21 DIAGNOSIS — Z5181 Encounter for therapeutic drug level monitoring: Secondary | ICD-10-CM

## 2024-04-21 DIAGNOSIS — E785 Hyperlipidemia, unspecified: Secondary | ICD-10-CM

## 2024-04-26 MED ORDER — ROSUVASTATIN CALCIUM 40 MG PO TABS: 40.0000 mg | ORAL_TABLET | Freq: Every day | ORAL | 3 refills | Status: AC

## 2024-05-19 ENCOUNTER — Telehealth: Payer: Self-pay | Admitting: Emergency Medicine

## 2024-05-19 DIAGNOSIS — I7121 Aneurysm of the ascending aorta, without rupture: Secondary | ICD-10-CM

## 2024-05-19 NOTE — Telephone Encounter (Signed)
 Reordered echo due to expiration date

## 2024-05-23 ENCOUNTER — Other Ambulatory Visit (HOSPITAL_BASED_OUTPATIENT_CLINIC_OR_DEPARTMENT_OTHER)

## 2024-06-17 ENCOUNTER — Other Ambulatory Visit (HOSPITAL_BASED_OUTPATIENT_CLINIC_OR_DEPARTMENT_OTHER)

## 2024-07-04 ENCOUNTER — Ambulatory Visit (HOSPITAL_BASED_OUTPATIENT_CLINIC_OR_DEPARTMENT_OTHER): Admitting: Cardiovascular Disease
